# Patient Record
Sex: Female | Born: 1950 | Race: White | Hispanic: No | Smoking: Current every day smoker
Health system: Southern US, Community
[De-identification: ages and names within clinical notes are randomized; demographics above are authoritative.]

## PROBLEM LIST (undated history)

## (undated) DIAGNOSIS — G43909 Migraine, unspecified, not intractable, without status migrainosus: Secondary | ICD-10-CM

## (undated) DIAGNOSIS — J449 Chronic obstructive pulmonary disease, unspecified: Secondary | ICD-10-CM

## (undated) DIAGNOSIS — J45909 Unspecified asthma, uncomplicated: Secondary | ICD-10-CM

## (undated) DIAGNOSIS — I10 Essential (primary) hypertension: Secondary | ICD-10-CM

## (undated) DIAGNOSIS — I83893 Varicose veins of bilateral lower extremities with other complications: Secondary | ICD-10-CM

## (undated) HISTORY — PX: TUBAL LIGATION: SHX77

## (undated) HISTORY — PX: ANKLE SURGERY: SHX546

## (undated) HISTORY — PX: CHOLECYSTECTOMY: SHX55

## (undated) HISTORY — DX: Varicose veins of bilateral lower extremities with other complications: I83.893

## (undated) HISTORY — PX: OTHER SURGICAL HISTORY: SHX169

---

## 1999-09-16 ENCOUNTER — Emergency Department (HOSPITAL_COMMUNITY): Admission: EM | Admit: 1999-09-16 | Discharge: 1999-09-16 | Payer: Self-pay | Admitting: Emergency Medicine

## 2003-01-26 DIAGNOSIS — K219 Gastro-esophageal reflux disease without esophagitis: Secondary | ICD-10-CM | POA: Insufficient documentation

## 2004-10-17 ENCOUNTER — Emergency Department: Payer: Self-pay | Admitting: Unknown Physician Specialty

## 2005-05-13 ENCOUNTER — Emergency Department: Payer: Self-pay | Admitting: Emergency Medicine

## 2005-11-08 ENCOUNTER — Other Ambulatory Visit: Payer: Self-pay

## 2005-11-08 ENCOUNTER — Inpatient Hospital Stay: Payer: Self-pay | Admitting: Unknown Physician Specialty

## 2006-05-12 ENCOUNTER — Other Ambulatory Visit: Payer: Self-pay

## 2006-05-12 ENCOUNTER — Emergency Department: Payer: Self-pay | Admitting: Emergency Medicine

## 2006-10-01 ENCOUNTER — Emergency Department: Payer: Self-pay | Admitting: Internal Medicine

## 2007-11-03 ENCOUNTER — Emergency Department: Payer: Self-pay | Admitting: Emergency Medicine

## 2008-05-24 ENCOUNTER — Emergency Department: Payer: Self-pay | Admitting: Emergency Medicine

## 2010-09-14 ENCOUNTER — Emergency Department: Payer: Self-pay | Admitting: Emergency Medicine

## 2010-10-08 ENCOUNTER — Emergency Department: Payer: Self-pay | Admitting: Emergency Medicine

## 2010-11-25 ENCOUNTER — Emergency Department: Payer: Self-pay | Admitting: Emergency Medicine

## 2011-12-07 ENCOUNTER — Emergency Department: Payer: Self-pay | Admitting: Internal Medicine

## 2011-12-07 LAB — CBC
HCT: 43.8 % (ref 35.0–47.0)
HGB: 15.1 g/dL (ref 12.0–16.0)
MCHC: 34.4 g/dL (ref 32.0–36.0)
RBC: 5.04 10*6/uL (ref 3.80–5.20)
RDW: 12.5 % (ref 11.5–14.5)

## 2011-12-07 LAB — COMPREHENSIVE METABOLIC PANEL
Albumin: 4 g/dL (ref 3.4–5.0)
Anion Gap: 10 (ref 7–16)
Calcium, Total: 8.8 mg/dL (ref 8.5–10.1)
Chloride: 108 mmol/L — ABNORMAL HIGH (ref 98–107)
Co2: 25 mmol/L (ref 21–32)
EGFR (Non-African Amer.): >60
Potassium: 3.6 mmol/L (ref 3.5–5.1)
Sodium: 143 mmol/L (ref 136–145)

## 2011-12-07 LAB — TROPONIN I: Troponin-I: <0.02 ng/mL

## 2011-12-17 DIAGNOSIS — J45909 Unspecified asthma, uncomplicated: Secondary | ICD-10-CM | POA: Insufficient documentation

## 2012-03-05 ENCOUNTER — Emergency Department: Payer: Self-pay | Admitting: Emergency Medicine

## 2012-03-07 ENCOUNTER — Emergency Department: Payer: Self-pay | Admitting: Emergency Medicine

## 2012-03-30 DIAGNOSIS — J309 Allergic rhinitis, unspecified: Secondary | ICD-10-CM | POA: Insufficient documentation

## 2012-06-03 ENCOUNTER — Emergency Department: Payer: Self-pay | Admitting: Emergency Medicine

## 2012-10-01 ENCOUNTER — Emergency Department: Payer: Self-pay | Admitting: Emergency Medicine

## 2012-10-01 LAB — COMPREHENSIVE METABOLIC PANEL
Albumin: 4 g/dL (ref 3.4–5.0)
Alkaline Phosphatase: 82 U/L (ref 50–136)
Anion Gap: 9 (ref 7–16)
Bilirubin,Total: 0.8 mg/dL (ref 0.2–1.0)
Calcium, Total: 8.6 mg/dL (ref 8.5–10.1)
Chloride: 105 mmol/L (ref 98–107)
Co2: 23 mmol/L (ref 21–32)
Creatinine: 0.64 mg/dL (ref 0.60–1.30)
EGFR (African American): >60
Glucose: 94 mg/dL (ref 65–99)
Osmolality: 272 (ref 275–301)
SGOT(AST): 25 U/L (ref 15–37)
SGPT (ALT): 29 U/L (ref 12–78)
Sodium: 137 mmol/L (ref 136–145)
Total Protein: 7.7 g/dL (ref 6.4–8.2)

## 2012-10-01 LAB — CBC
HGB: 14.9 g/dL (ref 12.0–16.0)
MCH: 29.5 pg (ref 26.0–34.0)
MCHC: 34.3 g/dL (ref 32.0–36.0)
MCV: 86 fL (ref 80–100)
WBC: 4.9 10*3/uL (ref 3.6–11.0)

## 2012-10-01 LAB — CK TOTAL AND CKMB (NOT AT ARMC): CK-MB: <0.5 ng/mL — ABNORMAL LOW (ref 0.5–3.6)

## 2012-10-01 LAB — PRO B NATRIURETIC PEPTIDE: B-Type Natriuretic Peptide: 25 pg/mL (ref 0–125)

## 2012-10-18 ENCOUNTER — Emergency Department: Payer: Self-pay | Admitting: Emergency Medicine

## 2012-10-28 ENCOUNTER — Emergency Department: Payer: Self-pay | Admitting: Emergency Medicine

## 2012-12-28 DIAGNOSIS — F418 Other specified anxiety disorders: Secondary | ICD-10-CM | POA: Insufficient documentation

## 2012-12-28 DIAGNOSIS — L309 Dermatitis, unspecified: Secondary | ICD-10-CM | POA: Insufficient documentation

## 2013-05-31 ENCOUNTER — Emergency Department: Payer: Self-pay | Admitting: Emergency Medicine

## 2013-11-01 ENCOUNTER — Emergency Department: Payer: Self-pay | Admitting: Emergency Medicine

## 2013-11-01 LAB — COMPREHENSIVE METABOLIC PANEL
AST: 21 U/L (ref 15–37)
Albumin: 3.4 g/dL (ref 3.4–5.0)
Alkaline Phosphatase: 73 U/L
Anion Gap: 6 — ABNORMAL LOW (ref 7–16)
BILIRUBIN TOTAL: 0.7 mg/dL (ref 0.2–1.0)
BUN: 10 mg/dL (ref 7–18)
CREATININE: 0.73 mg/dL (ref 0.60–1.30)
Calcium, Total: 8 mg/dL — ABNORMAL LOW (ref 8.5–10.1)
Chloride: 111 mmol/L — ABNORMAL HIGH (ref 98–107)
Co2: 25 mmol/L (ref 21–32)
EGFR (Non-African Amer.): >60
GLUCOSE: 92 mg/dL (ref 65–99)
Osmolality: 282 (ref 275–301)
Potassium: 3.4 mmol/L — ABNORMAL LOW (ref 3.5–5.1)
SGPT (ALT): 24 U/L (ref 12–78)
Sodium: 142 mmol/L (ref 136–145)
Total Protein: 6.8 g/dL (ref 6.4–8.2)

## 2013-11-01 LAB — URINALYSIS, COMPLETE
Bilirubin,UR: NEGATIVE
GLUCOSE, UR: NEGATIVE mg/dL (ref 0–75)
KETONE: NEGATIVE
Leukocyte Esterase: NEGATIVE
Nitrite: NEGATIVE
Ph: 5 (ref 4.5–8.0)
Protein: NEGATIVE
SPECIFIC GRAVITY: 1.006 (ref 1.003–1.030)
Squamous Epithelial: 5

## 2013-11-01 LAB — CBC
HCT: 45 % (ref 35.0–47.0)
HGB: 15.5 g/dL (ref 12.0–16.0)
MCH: 29.8 pg (ref 26.0–34.0)
MCHC: 34.5 g/dL (ref 32.0–36.0)
MCV: 86 fL (ref 80–100)
Platelet: 187 10*3/uL (ref 150–440)
RBC: 5.22 10*6/uL — AB (ref 3.80–5.20)
RDW: 12.7 % (ref 11.5–14.5)
WBC: 6 10*3/uL (ref 3.6–11.0)

## 2014-01-22 ENCOUNTER — Emergency Department: Payer: Self-pay | Admitting: Emergency Medicine

## 2014-01-22 LAB — COMPREHENSIVE METABOLIC PANEL
ALBUMIN: 3.7 g/dL (ref 3.4–5.0)
ALK PHOS: 76 U/L
ALT: 20 U/L (ref 12–78)
Anion Gap: 9 (ref 7–16)
BILIRUBIN TOTAL: 0.6 mg/dL (ref 0.2–1.0)
BUN: 14 mg/dL (ref 7–18)
CALCIUM: 8.4 mg/dL — AB (ref 8.5–10.1)
Chloride: 107 mmol/L (ref 98–107)
Co2: 26 mmol/L (ref 21–32)
Creatinine: 0.92 mg/dL (ref 0.60–1.30)
EGFR (Non-African Amer.): >60
GLUCOSE: 120 mg/dL — AB (ref 65–99)
OSMOLALITY: 285 (ref 275–301)
POTASSIUM: 3.4 mmol/L — AB (ref 3.5–5.1)
SGOT(AST): 25 U/L (ref 15–37)
SODIUM: 142 mmol/L (ref 136–145)
TOTAL PROTEIN: 7.1 g/dL (ref 6.4–8.2)

## 2014-01-22 LAB — URINALYSIS, COMPLETE
BACTERIA: NONE SEEN
Bilirubin,UR: NEGATIVE
Glucose,UR: NEGATIVE mg/dL (ref 0–75)
Ketone: NEGATIVE
Nitrite: NEGATIVE
PH: 5 (ref 4.5–8.0)
Protein: NEGATIVE
RBC,UR: 3 /HPF (ref 0–5)
Specific Gravity: 1.024 (ref 1.003–1.030)
Squamous Epithelial: 3
WBC UR: 4 /HPF (ref 0–5)

## 2014-01-22 LAB — CBC WITH DIFFERENTIAL/PLATELET
Basophil #: 0 10*3/uL (ref 0.0–0.1)
Basophil %: 0.8 %
EOS ABS: 0.2 10*3/uL (ref 0.0–0.7)
Eosinophil %: 2.6 %
HCT: 42.6 % (ref 35.0–47.0)
HGB: 14.2 g/dL (ref 12.0–16.0)
Lymphocyte #: 1.7 10*3/uL (ref 1.0–3.6)
Lymphocyte %: 29.7 %
MCH: 28.5 pg (ref 26.0–34.0)
MCHC: 33.4 g/dL (ref 32.0–36.0)
MCV: 85 fL (ref 80–100)
MONO ABS: 0.4 x10 3/mm (ref 0.2–0.9)
Monocyte %: 7.5 %
Neutrophil #: 3.4 10*3/uL (ref 1.4–6.5)
Neutrophil %: 59.4 %
Platelet: 229 10*3/uL (ref 150–440)
RBC: 4.99 10*6/uL (ref 3.80–5.20)
RDW: 12.6 % (ref 11.5–14.5)
WBC: 5.8 10*3/uL (ref 3.6–11.0)

## 2014-01-22 LAB — LIPASE, BLOOD: LIPASE: 216 U/L (ref 73–393)

## 2014-01-22 LAB — TROPONIN I

## 2014-02-03 ENCOUNTER — Emergency Department: Payer: Self-pay | Admitting: Internal Medicine

## 2014-02-03 LAB — URINALYSIS, COMPLETE
BILIRUBIN, UR: NEGATIVE
GLUCOSE, UR: NEGATIVE mg/dL (ref 0–75)
LEUKOCYTE ESTERASE: NEGATIVE
NITRITE: NEGATIVE
PROTEIN: NEGATIVE
Ph: 5 (ref 4.5–8.0)
Specific Gravity: 1.023 (ref 1.003–1.030)
Squamous Epithelial: 4
WBC UR: 2 /HPF (ref 0–5)

## 2014-02-03 LAB — COMPREHENSIVE METABOLIC PANEL
AST: 28 U/L (ref 15–37)
Albumin: 3.8 g/dL (ref 3.4–5.0)
Alkaline Phosphatase: 76 U/L
Anion Gap: 5 — ABNORMAL LOW (ref 7–16)
BUN: 12 mg/dL (ref 7–18)
Bilirubin,Total: 0.7 mg/dL (ref 0.2–1.0)
CALCIUM: 8.9 mg/dL (ref 8.5–10.1)
Chloride: 106 mmol/L (ref 98–107)
Co2: 29 mmol/L (ref 21–32)
Creatinine: 0.8 mg/dL (ref 0.60–1.30)
EGFR (Non-African Amer.): >60
Glucose: 109 mg/dL — ABNORMAL HIGH (ref 65–99)
OSMOLALITY: 280 (ref 275–301)
Potassium: 3.5 mmol/L (ref 3.5–5.1)
SGPT (ALT): 26 U/L (ref 12–78)
SODIUM: 140 mmol/L (ref 136–145)
Total Protein: 7.4 g/dL (ref 6.4–8.2)

## 2014-02-03 LAB — CBC
HCT: 41.9 % (ref 35.0–47.0)
HGB: 14.4 g/dL (ref 12.0–16.0)
MCH: 29.1 pg (ref 26.0–34.0)
MCHC: 34.4 g/dL (ref 32.0–36.0)
MCV: 85 fL (ref 80–100)
Platelet: 219 10*3/uL (ref 150–440)
RBC: 4.96 10*6/uL (ref 3.80–5.20)
RDW: 12.7 % (ref 11.5–14.5)
WBC: 4.8 10*3/uL (ref 3.6–11.0)

## 2014-02-03 LAB — TROPONIN I: Troponin-I: <0.02 ng/mL

## 2014-04-13 ENCOUNTER — Emergency Department: Payer: Self-pay | Admitting: Student

## 2014-04-22 ENCOUNTER — Emergency Department: Payer: Self-pay | Admitting: Internal Medicine

## 2014-05-01 ENCOUNTER — Emergency Department: Payer: Self-pay | Admitting: Emergency Medicine

## 2014-06-01 ENCOUNTER — Emergency Department: Payer: Self-pay | Admitting: Emergency Medicine

## 2014-06-13 ENCOUNTER — Emergency Department: Payer: Self-pay | Admitting: Emergency Medicine

## 2014-06-13 LAB — URINALYSIS, COMPLETE
Bilirubin,UR: NEGATIVE
Glucose,UR: NEGATIVE mg/dL (ref 0–75)
KETONE: NEGATIVE
Nitrite: NEGATIVE
PH: 5 (ref 4.5–8.0)
PROTEIN: NEGATIVE
Specific Gravity: 1.014 (ref 1.003–1.030)
Squamous Epithelial: 7
WBC UR: 5 /HPF (ref 0–5)

## 2014-06-13 LAB — COMPREHENSIVE METABOLIC PANEL WITH GFR
Albumin: 3.8 g/dL
Alkaline Phosphatase: 83 U/L
Anion Gap: 7
BUN: 17 mg/dL
Bilirubin,Total: 0.7 mg/dL
Calcium, Total: 8.9 mg/dL
Chloride: 102 mmol/L
Co2: 27 mmol/L
Creatinine: 0.9 mg/dL
EGFR (African American): >60
EGFR (Non-African Amer.): >60
Glucose: 115 mg/dL — ABNORMAL HIGH
Osmolality: 274
Potassium: 3.8 mmol/L
SGOT(AST): 22 U/L
SGPT (ALT): 30 U/L
Sodium: 136 mmol/L
Total Protein: 7.7 g/dL

## 2014-06-13 LAB — CBC WITH DIFFERENTIAL/PLATELET
BASOS ABS: 0 10*3/uL (ref 0.0–0.1)
Basophil %: 0.9 %
Eosinophil #: 0 10*3/uL (ref 0.0–0.7)
Eosinophil %: 0.2 %
HCT: 42.1 % (ref 35.0–47.0)
HGB: 14.1 g/dL (ref 12.0–16.0)
LYMPHS PCT: 28.1 %
Lymphocyte #: 1.6 10*3/uL (ref 1.0–3.6)
MCH: 29.1 pg (ref 26.0–34.0)
MCHC: 33.4 g/dL (ref 32.0–36.0)
MCV: 87 fL (ref 80–100)
Monocyte #: 0.7 x10 3/mm (ref 0.2–0.9)
Monocyte %: 12.6 %
Neutrophil #: 3.2 10*3/uL (ref 1.4–6.5)
Neutrophil %: 58.2 %
Platelet: 235 10*3/uL (ref 150–440)
RBC: 4.83 10*6/uL (ref 3.80–5.20)
RDW: 12.3 % (ref 11.5–14.5)
WBC: 5.5 10*3/uL (ref 3.6–11.0)

## 2014-06-15 LAB — BETA STREP CULTURE(ARMC)

## 2014-07-24 ENCOUNTER — Emergency Department: Payer: Self-pay | Admitting: Emergency Medicine

## 2014-07-24 LAB — LIPASE, BLOOD: Lipase: 120 U/L (ref 73–393)

## 2014-07-24 LAB — CBC WITH DIFFERENTIAL/PLATELET
BASOS ABS: 0 10*3/uL (ref 0.0–0.1)
Basophil %: 0.3 %
Eosinophil #: 0.1 10*3/uL (ref 0.0–0.7)
Eosinophil %: 1.5 %
HCT: 44.2 % (ref 35.0–47.0)
HGB: 14.7 g/dL (ref 12.0–16.0)
Lymphocyte #: 0.7 10*3/uL — ABNORMAL LOW (ref 1.0–3.6)
Lymphocyte %: 9.3 %
MCH: 28.7 pg (ref 26.0–34.0)
MCHC: 33.1 g/dL (ref 32.0–36.0)
MCV: 87 fL (ref 80–100)
Monocyte #: 0.3 x10 3/mm (ref 0.2–0.9)
Monocyte %: 3.4 %
Neutrophil #: 6.5 10*3/uL (ref 1.4–6.5)
Neutrophil %: 85.5 %
PLATELETS: 240 10*3/uL (ref 150–440)
RBC: 5.1 10*6/uL (ref 3.80–5.20)
RDW: 12.6 % (ref 11.5–14.5)
WBC: 7.6 10*3/uL (ref 3.6–11.0)

## 2014-07-24 LAB — COMPREHENSIVE METABOLIC PANEL
ALT: 27 U/L
AST: 25 U/L (ref 15–37)
Albumin: 3.7 g/dL (ref 3.4–5.0)
Alkaline Phosphatase: 83 U/L
Anion Gap: 6 — ABNORMAL LOW (ref 7–16)
BUN: 12 mg/dL (ref 7–18)
Bilirubin,Total: 0.9 mg/dL (ref 0.2–1.0)
CO2: 23 mmol/L (ref 21–32)
Calcium, Total: 8.5 mg/dL (ref 8.5–10.1)
Chloride: 108 mmol/L — ABNORMAL HIGH (ref 98–107)
Creatinine: 0.59 mg/dL — ABNORMAL LOW (ref 0.60–1.30)
EGFR (Non-African Amer.): >60
GLUCOSE: 100 mg/dL — AB (ref 65–99)
OSMOLALITY: 274 (ref 275–301)
Potassium: 4.1 mmol/L (ref 3.5–5.1)
Sodium: 137 mmol/L (ref 136–145)
Total Protein: 7.3 g/dL (ref 6.4–8.2)

## 2014-07-24 LAB — URINALYSIS, COMPLETE
Bacteria: NONE SEEN
Bilirubin,UR: NEGATIVE
Glucose,UR: NEGATIVE mg/dL (ref 0–75)
Ketone: NEGATIVE
Leukocyte Esterase: NEGATIVE
Nitrite: NEGATIVE
PH: 5 (ref 4.5–8.0)
PROTEIN: NEGATIVE
SPECIFIC GRAVITY: 1.015 (ref 1.003–1.030)
Squamous Epithelial: 1

## 2014-09-14 ENCOUNTER — Emergency Department: Payer: Self-pay | Admitting: Emergency Medicine

## 2014-09-14 LAB — URINALYSIS, COMPLETE
Bilirubin,UR: NEGATIVE
GLUCOSE, UR: NEGATIVE mg/dL (ref 0–75)
Ketone: NEGATIVE
NITRITE: NEGATIVE
PROTEIN: NEGATIVE
Ph: 5 (ref 4.5–8.0)
RBC,UR: 1 /HPF (ref 0–5)
SPECIFIC GRAVITY: 1.004 (ref 1.003–1.030)
Squamous Epithelial: 2
WBC UR: 1 /HPF (ref 0–5)

## 2014-09-14 LAB — COMPREHENSIVE METABOLIC PANEL
ALK PHOS: 72 U/L (ref 46–116)
ALT: 32 U/L (ref 14–63)
Albumin: 3.8 g/dL (ref 3.4–5.0)
Anion Gap: 6 — ABNORMAL LOW (ref 7–16)
BUN: 13 mg/dL (ref 7–18)
Bilirubin,Total: 0.5 mg/dL (ref 0.2–1.0)
CALCIUM: 9.1 mg/dL (ref 8.5–10.1)
CO2: 29 mmol/L (ref 21–32)
Chloride: 105 mmol/L (ref 98–107)
Creatinine: 0.71 mg/dL (ref 0.60–1.30)
Glucose: 112 mg/dL — ABNORMAL HIGH (ref 65–99)
Osmolality: 280 (ref 275–301)
POTASSIUM: 3.7 mmol/L (ref 3.5–5.1)
SGOT(AST): 28 U/L (ref 15–37)
SODIUM: 140 mmol/L (ref 136–145)
Total Protein: 7.7 g/dL (ref 6.4–8.2)

## 2014-09-14 LAB — CBC
HCT: 42.3 % (ref 35.0–47.0)
HGB: 14.5 g/dL (ref 12.0–16.0)
MCH: 29 pg (ref 26.0–34.0)
MCHC: 34.3 g/dL (ref 32.0–36.0)
MCV: 85 fL (ref 80–100)
PLATELETS: 245 10*3/uL (ref 150–440)
RBC: 4.99 10*6/uL (ref 3.80–5.20)
RDW: 12.7 % (ref 11.5–14.5)
WBC: 6.8 10*3/uL (ref 3.6–11.0)

## 2014-09-14 LAB — TROPONIN I: Troponin-I: <0.02 ng/mL

## 2014-11-02 ENCOUNTER — Emergency Department: Payer: Self-pay | Admitting: Emergency Medicine

## 2014-12-22 ENCOUNTER — Emergency Department
Admission: EM | Admit: 2014-12-22 | Discharge: 2014-12-22 | Disposition: A | Discharge status: Home / Self Care | Payer: BLUE CROSS/BLUE SHIELD | Attending: Emergency Medicine | Admitting: Emergency Medicine

## 2014-12-22 ENCOUNTER — Encounter: Payer: Self-pay | Admitting: *Deleted

## 2014-12-22 DIAGNOSIS — N390 Urinary tract infection, site not specified: Secondary | ICD-10-CM

## 2014-12-22 DIAGNOSIS — Z87891 Personal history of nicotine dependence: Secondary | ICD-10-CM | POA: Diagnosis not present

## 2014-12-22 DIAGNOSIS — M545 Low back pain: Secondary | ICD-10-CM | POA: Diagnosis not present

## 2014-12-22 DIAGNOSIS — R6883 Chills (without fever): Secondary | ICD-10-CM | POA: Diagnosis present

## 2014-12-22 DIAGNOSIS — I10 Essential (primary) hypertension: Secondary | ICD-10-CM | POA: Insufficient documentation

## 2014-12-22 DIAGNOSIS — Z7982 Long term (current) use of aspirin: Secondary | ICD-10-CM | POA: Insufficient documentation

## 2014-12-22 HISTORY — DX: Chronic obstructive pulmonary disease, unspecified: J44.9

## 2014-12-22 HISTORY — DX: Unspecified asthma, uncomplicated: J45.909

## 2014-12-22 HISTORY — DX: Essential (primary) hypertension: I10

## 2014-12-22 LAB — URINALYSIS COMPLETE WITH MICROSCOPIC (ARMC ONLY)
Bilirubin Urine: NEGATIVE
GLUCOSE, UA: NEGATIVE mg/dL
Ketones, ur: NEGATIVE mg/dL
NITRITE: NEGATIVE
Protein, ur: NEGATIVE mg/dL
Specific Gravity, Urine: 1.005 (ref 1.005–1.030)
pH: 6 (ref 5.0–8.0)

## 2014-12-22 MED ORDER — CIPROFLOXACIN HCL 500 MG PO TABS: 500.0000 mg | ORAL_TABLET | Freq: Two times a day (BID) | ORAL | Status: DC

## 2014-12-22 NOTE — ED Notes (Signed)
Pt complains of runny nose, chills and diarrhea starting yesterday

## 2014-12-22 NOTE — ED Provider Notes (Signed)
[facility] Emergency Department Provider Note  ____________________________________________  Time seen: 1602  I have reviewed the triage vital signs and the nursing notes.   HISTORY  Chief Complaint Sore Throat   HPI Andrea Ford is a 64 y.o. female comes today complaining of chills, productive cough, diarrhea 5 times in last 24 hours. Symptoms started yesterday. She's also had a runny nose which occasionally is green. She has been taking over-the-counter Claritin without any relief. She also gives a history of having a urinary tract infection 2 weeks ago at which time she was treated with a sulfa drug.Patient describes aching in her lower back and upper shoulders. She denies any nausea or vomiting. She has not taken any Tylenol or Motrin for her fever or aches. Her primary care doctor is in [place] and she also sees a doctor at a walk-in clinic in [place]. Past Medical History  Diagnosis Date  . Hypertension   . COPD (chronic obstructive pulmonary disease)   . Asthma     There are no active problems to display for this patient.   Past Surgical History  Procedure Laterality Date  . Cholecystectomy    . Ankle surgery Right     Current Outpatient Rx  Name  Route  Sig  Dispense  Refill  . aspirin 81 MG chewable tablet   Oral   Chew 81 mg by mouth daily.         . enalapril (VASOTEC) 10 MG tablet   Oral   Take 10 mg by mouth 2 (two) times daily.         . ciprofloxacin (CIPRO) 500 MG tablet   Oral   Take 1 tablet (500 mg total) by mouth 2 (two) times daily.   14 tablet   0     Allergies Codeine  No family history on file.  Social History History  Substance Use Topics  . Smoking status: Former Research scientist (life sciences)  . Smokeless tobacco: Not on file  . Alcohol Use: No    Review of Systems Constitutional: Temp not taken. Positive for chills.  Eyes: No visual changes. ENT: Positive sore throat. Cardiovascular: Denies chest  pain. Respiratory: Denies shortness of breath. Gastrointestinal: No abdominal pain.  No nausea, no vomiting.  Positive diarrhea.  No constipation. Genitourinary: Negative for dysuria. Musculoskeletal: Positive for low back pain  Skin: Negative for rash. Neurological: Negative for headaches 10-point ROS otherwise negative.  ____________________________________________   PHYSICAL EXAM:  VITAL SIGNS: ED Triage Vitals  Enc Vitals Group     BP 12/22/14 1518 151/90 mmHg     Pulse Rate 12/22/14 1518 102     Resp --      Temp 12/22/14 1518 98.9 F (37.2 C)     Temp Source 12/22/14 1518 Oral     SpO2 12/22/14 1518 98 %     Weight 12/22/14 1518 166 lb (75.297 kg)     Height 12/22/14 1518 5\' 3"  (1.6 m)     Head Cir --      Peak Flow --      Pain Score --      Pain Loc --      Pain Edu? --      Excl. in [place]? --     Constitutional: Alert and oriented. Well appearing and in no acute distress. Eyes: Conjunctivae are normal. PERRL. EOMI. Head: Atraumatic. Nose: No congestion/rhinnorhea. Mouth/Throat: Mucous membranes are moist.  Oropharynx non-erythematous. Neck: No stridor.   Hematological/Lymphatic/Immunilogical: No cervical lymphadenopathy. Cardiovascular:  Normal rate, regular rhythm. Grossly normal heart sounds.  Good peripheral circulation. Respiratory: Normal respiratory effort.  No retractions. Lungs CTAB. Gastrointestinal: Soft and nontender. No distention. No abdominal bruits. No CVA tenderness.  Bowel sounds normoactive 4 quadrants Genitourinary: Denies any symptoms. Musculoskeletal: No lower extremity tenderness nor edema.  No joint effusions. Nontender palpation of lower back. No CVA tenderness. Neurologic:  Normal speech and language. No gross focal neurologic deficits are appreciated. Speech is normal. No gait instability. Skin:  Skin is warm, dry and intact. No rash noted. Psychiatric: Mood and affect are normal. Speech and behavior are  normal.  ____________________________________________   LABS (all labs ordered are listed, but only abnormal results are displayed)  Labs Reviewed  URINALYSIS COMPLETEWITH MICROSCOPIC ([place])  - Abnormal; Notable for the following:    Color, Urine STRAW (*)    APPearance CLEAR (*)    Hgb urine dipstick 1+ (*)    Leukocytes, UA 2+ (*)    Bacteria, UA RARE (*)    Squamous Epithelial / LPF 0-5 (*)    All other components within normal limits   ____________________________________________  EKG  Not done ____________________________________________  RADIOLOGY  Not done ____________________________________________   PROCEDURES  Procedure(s) performed: None  Critical Care performed: No  ____________________________________________   INITIAL IMPRESSION / ASSESSMENT AND PLAN / ED COURSE  Pertinent labs & imaging results that were available during my care of the patient were reviewed by me and considered in my medical decision making (see chart for details).  Patient is to follow-up with her doctor in [place] or the walk-in clinic she is familiar with in [place]. Today her urinalysis shows 2+ leukocytes and 6-30 WBCs. She has been placed on Cipro for 7 days along with Zofran if needed for nausea. She is to stay on a clear liquid diet due to diarrhea. And was given a note for work. ____________________________________________   FINAL CLINICAL IMPRESSION(S) / ED DIAGNOSES  Final diagnoses:  Acute urinary tract infection      Johnn Hai, PA-C 12/22/14 1755

## 2014-12-22 NOTE — ED Notes (Signed)
Lower back pain, nausea, sore throat , " I hurt all over " , no injury

## 2014-12-25 ENCOUNTER — Emergency Department
Admission: EM | Admit: 2014-12-25 | Discharge: 2014-12-25 | Disposition: A | Discharge status: Home / Self Care | Payer: BLUE CROSS/BLUE SHIELD | Attending: Emergency Medicine | Admitting: Emergency Medicine

## 2014-12-25 ENCOUNTER — Emergency Department: Payer: BLUE CROSS/BLUE SHIELD

## 2014-12-25 DIAGNOSIS — R0981 Nasal congestion: Secondary | ICD-10-CM | POA: Diagnosis present

## 2014-12-25 DIAGNOSIS — Z792 Long term (current) use of antibiotics: Secondary | ICD-10-CM | POA: Diagnosis not present

## 2014-12-25 DIAGNOSIS — N72 Inflammatory disease of cervix uteri: Secondary | ICD-10-CM

## 2014-12-25 DIAGNOSIS — J069 Acute upper respiratory infection, unspecified: Secondary | ICD-10-CM | POA: Diagnosis not present

## 2014-12-25 DIAGNOSIS — Z87891 Personal history of nicotine dependence: Secondary | ICD-10-CM | POA: Diagnosis not present

## 2014-12-25 DIAGNOSIS — I10 Essential (primary) hypertension: Secondary | ICD-10-CM | POA: Diagnosis not present

## 2014-12-25 DIAGNOSIS — Z7982 Long term (current) use of aspirin: Secondary | ICD-10-CM | POA: Insufficient documentation

## 2014-12-25 DIAGNOSIS — R300 Dysuria: Secondary | ICD-10-CM | POA: Insufficient documentation

## 2014-12-25 DIAGNOSIS — Z79899 Other long term (current) drug therapy: Secondary | ICD-10-CM | POA: Diagnosis not present

## 2014-12-25 DIAGNOSIS — M545 Low back pain: Secondary | ICD-10-CM | POA: Insufficient documentation

## 2014-12-25 LAB — CBC
HCT: 40.6 % (ref 35.0–47.0)
Hemoglobin: 14.1 g/dL (ref 12.0–16.0)
MCH: 29.2 pg (ref 26.0–34.0)
MCHC: 34.6 g/dL (ref 32.0–36.0)
MCV: 84.6 fL (ref 80.0–100.0)
Platelets: 231 10*3/uL (ref 150–440)
RBC: 4.8 MIL/uL (ref 3.80–5.20)
RDW: 12.5 % (ref 11.5–14.5)
WBC: 5.6 10*3/uL (ref 3.6–11.0)

## 2014-12-25 LAB — URINALYSIS COMPLETE WITH MICROSCOPIC (ARMC ONLY)
BACTERIA UA: NONE SEEN
BILIRUBIN URINE: NEGATIVE
Glucose, UA: NEGATIVE mg/dL
Ketones, ur: NEGATIVE mg/dL
Nitrite: NEGATIVE
PH: 5 (ref 5.0–8.0)
PROTEIN: NEGATIVE mg/dL
SPECIFIC GRAVITY, URINE: 1.021 (ref 1.005–1.030)

## 2014-12-25 LAB — COMPREHENSIVE METABOLIC PANEL
ALT: 24 U/L (ref 14–54)
ANION GAP: 8 (ref 5–15)
AST: 28 U/L (ref 15–41)
Albumin: 4.1 g/dL (ref 3.5–5.0)
Alkaline Phosphatase: 66 U/L (ref 38–126)
BILIRUBIN TOTAL: 0.6 mg/dL (ref 0.3–1.2)
BUN: 10 mg/dL (ref 6–20)
CO2: 27 mmol/L (ref 22–32)
Calcium: 8.9 mg/dL (ref 8.9–10.3)
Chloride: 104 mmol/L (ref 101–111)
Creatinine, Ser: 0.68 mg/dL (ref 0.44–1.00)
GFR calc Af Amer: >60 mL/min (ref >60)
GFR calc non Af Amer: >60 mL/min (ref >60)
Glucose, Bld: 107 mg/dL — ABNORMAL HIGH (ref 65–99)
Potassium: 3.9 mmol/L (ref 3.5–5.1)
SODIUM: 139 mmol/L (ref 135–145)
Total Protein: 7.7 g/dL (ref 6.5–8.1)

## 2014-12-25 LAB — WET PREP, GENITAL
Clue Cells Wet Prep HPF POC: NONE SEEN
Trich, Wet Prep: NONE SEEN
YEAST WET PREP: NONE SEEN

## 2014-12-25 MED ORDER — BENZONATATE 100 MG PO CAPS: 200.0000 mg | ORAL_CAPSULE | Freq: Three times a day (TID) | ORAL | Status: AC | PRN

## 2014-12-25 MED ORDER — AZITHROMYCIN 250 MG PO TABS
1000.0000 mg | ORAL_TABLET | Freq: Once | ORAL | Status: AC
  Administered 2014-12-25: 1000 mg via ORAL

## 2014-12-25 MED ORDER — CEFTRIAXONE SODIUM 250 MG IJ SOLR
250.0000 mg | Freq: Once | INTRAMUSCULAR | Status: AC
  Administered 2014-12-25: 250 mg via INTRAMUSCULAR

## 2014-12-25 MED ORDER — AZITHROMYCIN 250 MG PO TABS
ORAL_TABLET | ORAL | Status: AC
  Administered 2014-12-25: 1000 mg via ORAL
  Filled 2014-12-25: qty 4

## 2014-12-25 MED ORDER — LIDOCAINE HCL (PF) 1 % IJ SOLN
0.9000 mL | Freq: Once | INTRAMUSCULAR | Status: AC
  Administered 2014-12-25: 0.9 mL

## 2014-12-25 MED ORDER — LIDOCAINE HCL (PF) 1 % IJ SOLN
INTRAMUSCULAR | Status: AC
  Administered 2014-12-25: 0.9 mL
  Filled 2014-12-25: qty 5

## 2014-12-25 MED ORDER — CEFTRIAXONE SODIUM 250 MG IJ SOLR
INTRAMUSCULAR | Status: AC
  Administered 2014-12-25: 250 mg via INTRAMUSCULAR
  Filled 2014-12-25: qty 250

## 2014-12-25 NOTE — ED Provider Notes (Signed)
[facility] Emergency Department Provider Note  ____________________________________________  Time seen: 7:28 AM  I have reviewed the triage vital signs and the nursing notes.   HISTORY  Chief Complaint Dysuria and Nasal Congestion    HPI Andrea Ford is a 64 y.o. female was treated for urinary tract infection recently in [place]. She was also seen in the emergency room on 5/12 with continued complaint of dysuria. She returns today with complaint of productive cough, congestion, rhinorrhea. She has continued to take Cipro for her urinary tract infection. She has experienced 5-6 loose stools. She is unaware of any fever nausea or vomiting. She continues with dysuria but denies any vaginal discharge. However she does say that she wears a small mini pad every day for urinary incontinence. She denies being sexually active for the last 5 years. She rates her pain as a 7/10.   Past Medical History  Diagnosis Date  . Hypertension   . COPD (chronic obstructive pulmonary disease)   . Asthma     There are no active problems to display for this patient.   Past Surgical History  Procedure Laterality Date  . Cholecystectomy    . Ankle surgery Right     Current Outpatient Rx  Name  Route  Sig  Dispense  Refill  . aspirin 81 MG chewable tablet   Oral   Chew 81 mg by mouth daily.         . benzonatate (TESSALON PERLES) 100 MG capsule   Oral   Take 2 capsules (200 mg total) by mouth 3 (three) times daily as needed for cough.   40 capsule   0   . ciprofloxacin (CIPRO) 500 MG tablet   Oral   Take 1 tablet (500 mg total) by mouth 2 (two) times daily.   14 tablet   0   . enalapril (VASOTEC) 10 MG tablet   Oral   Take 10 mg by mouth 2 (two) times daily.           Allergies Codeine  No family history on file.  Social History History  Substance Use Topics  . Smoking status: Former Research scientist (life sciences)  . Smokeless tobacco: Not on file  . Alcohol Use: No     Review of Systems Constitutional: No fever/chills Eyes: No visual changes. ENT: Occasional sore throat. Cardiovascular: Denies chest pain. Respiratory: Denies shortness of breath. Occasional cough today nonproductive. Gastrointestinal: No abdominal pain.  No nausea, no vomiting.  No diarrhea.  No constipation. Genitourinary: Negative for dysuria. Musculoskeletal: Positive for low back pain. Skin: Negative for rash. Neurological: Negative for headaches, focal weakness or numbness.  10-point ROS otherwise negative.  ____________________________________________   PHYSICAL EXAM:  VITAL SIGNS: ED Triage Vitals  Enc Vitals Group     BP 12/25/14 0635 142/87 mmHg     Pulse Rate 12/25/14 0635 76     Resp 12/25/14 0635 20     Temp 12/25/14 0635 97.5 F (36.4 C)     Temp Source 12/25/14 0635 Oral     SpO2 12/25/14 0635 98 %     Weight 12/25/14 0635 166 lb (75.297 kg)     Height 12/25/14 0635 5\' 3"  (1.6 m)     Head Cir --      Peak Flow --      Pain Score 12/25/14 0635 7     Pain Loc --      Pain Edu? --      Excl. in [place]? --  Constitutional: Alert and oriented. Well appearing and in no acute distress. Eyes: Conjunctivae are normal. PERRL. EOMI. Head: Atraumatic. Nose: Mild congestionB cases pale Mouth/Throat: Mucous membranes are moist.  Oropharynx non-erythematous. Neck: No stridor.   Hematological/Lymphatic/Immunilogical: No cervical lymphadenopathy. Cardiovascular: Normal rate, regular rhythm. Grossly normal heart sounds.  Good peripheral circulation. Respiratory: Normal respiratory effort.  No retractions. Lungs CTAB. Gastrointestinal: Soft and nontender. No distention. No abdominal bruits. No CVA tenderness. Genitourinary: Pelvic exam was done. On exam there is white exudate in the vaginal vault and around the cervix. Cervix is irritated, slightly red. There is no discharge from the os. Bimanual exam nontender on palpation of the ovaries or cervix. No masses were  noted. Musculoskeletal: No lower extremity tenderness nor edema.  No joint effusions. Neurologic:  Normal speech and language. No gross focal neurologic deficits are appreciated. Speech is normal. No gait instability. Skin:  Skin is warm, dry and intact. No rash noted. Psychiatric: Mood and affect are normal. Speech and behavior are normal.  ____________________________________________   LABS (all labs ordered are listed, but only abnormal results are displayed)  Labs Reviewed  WET PREP, GENITAL - Abnormal; Notable for the following:    WBC, Wet Prep HPF POC MODERATE (*)    All other components within normal limits  URINALYSIS COMPLETEWITH MICROSCOPIC ([facility])  - Abnormal; Notable for the following:    Color, Urine YELLOW (*)    APPearance CLEAR (*)    Hgb urine dipstick 2+ (*)    Leukocytes, UA 1+ (*)    Squamous Epithelial / LPF 0-5 (*)    All other components within normal limits  COMPREHENSIVE METABOLIC PANEL - Abnormal; Notable for the following:    Glucose, Bld 107 (*)    All other components within normal limits  CBC   ____________________________________________ RADIOLOGY  Chest x-ray per radiologist shows no focal infiltrate, pulmonary edema, or pleural effusion ____________________________________________   PROCEDURES  Procedure(s) performed: None  Critical Care performed: No  ____________________________________________   INITIAL IMPRESSION / ASSESSMENT AND PLAN / ED COURSE  Pertinent labs & imaging results that were available during my care of the patient were reviewed by me and considered in my medical decision making (see chart for details).  Lab work was discussed with the patient. She is aware of the findings of her pelvic exam. She was given a injection of Rocephin 250 mg IM and 1 g of Zithromax. She will follow up with her family doctor at [facility]. She was also given a prescription for Tessalon Perles as needed for  cough. ____________________________________________   FINAL CLINICAL IMPRESSION(S) / ED DIAGNOSES  Final diagnoses:  Acute cervicitis  Viral upper respiratory infection      Johnn Hai, PA-C 12/25/14 [place], MD 12/25/14 1539

## 2014-12-25 NOTE — ED Notes (Signed)
Pt presents to ER alert and in NAD. Pt reports she has been treated for UTI recently, but still having dysuria. Pt reports she has to go to work tomorrow and doesn't feel any better.

## 2014-12-25 NOTE — Discharge Instructions (Signed)
Cervicitis Cervicitis is a soreness and swelling (inflammation) of the cervix. Your cervix is located at the bottom of your uterus. It opens up to the vagina. CAUSES   Sexually transmitted infections (STIs).   Allergic reaction.   Medicines or birth control devices that are put in the vagina.   Injury to the cervix.   Bacterial infections.  RISK FACTORS You are at greater risk if you:  Have unprotected sexual intercourse.  Have sexual intercourse with many partners.  Began sexual intercourse at an early age.  Have a history of STIs. SYMPTOMS  There may be no symptoms. If symptoms occur, they may include:   Gray, white, yellow, or bad-smelling vaginal discharge.   Pain or itching of the area outside the vagina.   Painful sexual intercourse.   Lower abdominal or lower back pain, especially during intercourse.   Frequent urination.   Abnormal vaginal bleeding between periods, after sexual intercourse, or after menopause.   Pressure or a heavy feeling in the pelvis.  DIAGNOSIS  Diagnosis is made after a pelvic exam. Other tests may include:   Examination of any discharge under a microscope (wet prep).   A Pap test.  TREATMENT  Treatment will depend on the cause of cervicitis. If it is caused by an STI, both you and your partner will need to be treated. Antibiotic medicines will be given.  HOME CARE INSTRUCTIONS   Do not have sexual intercourse until your health care provider says it is okay.   Do not have sexual intercourse until your partner has been treated, if your cervicitis is caused by an STI.   Take your antibiotics as directed. Finish them even if you start to feel better.  SEEK MEDICAL CARE IF:  Your symptoms come back.   You have a fever.  MAKE SURE YOU:   Understand these instructions.  Will watch your condition.  Will get help right away if you are not doing well or get worse. Document Released: 07/29/2005 Document Revised:  08/03/2013 Document Reviewed: 01/20/2013 [facility] Patient Information 2015 [place], [place]. This information is not intended to replace advice given to you by your health care provider. Make sure you discuss any questions you have with your health care provider.

## 2015-01-02 NOTE — ED Provider Notes (Signed)
I was apparently in the ER during the time the patient was here was available for consult  Nena Polio, MD 01/02/15 [phone]

## 2015-01-05 NOTE — ED Provider Notes (Signed)
Medical screening examination/treatment/procedure(s) were performed by non-physician practitioner and as supervising physician I was immediately available for consultation/collaboration.    Nena Polio, MD 01/05/15 [phone]

## 2015-01-12 ENCOUNTER — Encounter: Payer: Self-pay | Admitting: *Deleted

## 2015-01-12 ENCOUNTER — Emergency Department: Payer: BLUE CROSS/BLUE SHIELD

## 2015-01-12 ENCOUNTER — Emergency Department
Admission: EM | Admit: 2015-01-12 | Discharge: 2015-01-12 | Disposition: A | Discharge status: Home / Self Care | Payer: BLUE CROSS/BLUE SHIELD | Attending: Emergency Medicine | Admitting: Emergency Medicine

## 2015-01-12 DIAGNOSIS — S80861A Insect bite (nonvenomous), right lower leg, initial encounter: Secondary | ICD-10-CM | POA: Insufficient documentation

## 2015-01-12 DIAGNOSIS — I10 Essential (primary) hypertension: Secondary | ICD-10-CM | POA: Insufficient documentation

## 2015-01-12 DIAGNOSIS — S80862A Insect bite (nonvenomous), left lower leg, initial encounter: Secondary | ICD-10-CM | POA: Insufficient documentation

## 2015-01-12 DIAGNOSIS — Z87891 Personal history of nicotine dependence: Secondary | ICD-10-CM | POA: Insufficient documentation

## 2015-01-12 DIAGNOSIS — R05 Cough: Secondary | ICD-10-CM | POA: Diagnosis present

## 2015-01-12 DIAGNOSIS — Z7982 Long term (current) use of aspirin: Secondary | ICD-10-CM | POA: Insufficient documentation

## 2015-01-12 DIAGNOSIS — J40 Bronchitis, not specified as acute or chronic: Secondary | ICD-10-CM

## 2015-01-12 DIAGNOSIS — Y998 Other external cause status: Secondary | ICD-10-CM | POA: Diagnosis not present

## 2015-01-12 DIAGNOSIS — Z79899 Other long term (current) drug therapy: Secondary | ICD-10-CM | POA: Diagnosis not present

## 2015-01-12 DIAGNOSIS — Y9389 Activity, other specified: Secondary | ICD-10-CM | POA: Diagnosis not present

## 2015-01-12 DIAGNOSIS — W57XXXA Bitten or stung by nonvenomous insect and other nonvenomous arthropods, initial encounter: Secondary | ICD-10-CM | POA: Insufficient documentation

## 2015-01-12 DIAGNOSIS — J449 Chronic obstructive pulmonary disease, unspecified: Secondary | ICD-10-CM | POA: Insufficient documentation

## 2015-01-12 DIAGNOSIS — Y9289 Other specified places as the place of occurrence of the external cause: Secondary | ICD-10-CM | POA: Insufficient documentation

## 2015-01-12 MED ORDER — DOXYCYCLINE HYCLATE 100 MG PO CAPS: 100.0000 mg | ORAL_CAPSULE | Freq: Two times a day (BID) | ORAL | Status: AC

## 2015-01-12 MED ORDER — TRIAMCINOLONE ACETONIDE 0.5 % EX OINT: 1.0000 "application " | TOPICAL_OINTMENT | Freq: Two times a day (BID) | CUTANEOUS | Status: DC

## 2015-01-12 NOTE — ED Provider Notes (Signed)
CSN: [id number]     Arrival date & time 01/12/15  0907 History   First MD Initiated Contact with Patient 01/12/15 [phone]     Chief Complaint  Patient presents with  . URI    HPI Comments: 64 year old female presents today complaining of recurrent cough. Patient reports she has a cough for the past 4 weeks. She has a long history of COPD and asthma. She quit smoking last year. She reports that her daughter still smokes inside her home. She has not had any shortness of breath or wheezing. She has been using her inhaler, but this does not help the cough. She has not had a fever, sweats, chills or joint pain. Patient is also concerned about insect bites to lower leg that she noticed yesterday. They itch all the time, but are not painful.  Patient is a 64 y.o. female presenting with URI. The history is provided by the patient.  URI Presenting symptoms: congestion, cough and rhinorrhea   Presenting symptoms: no fever   Severity:  Moderate Onset quality:  Gradual Duration:  4 weeks Timing:  Constant Progression:  Unchanged Chronicity:  Recurrent Relieved by:  None tried Worsened by:  Breathing Ineffective treatments:  Inhaler Associated symptoms: no arthralgias, no myalgias, no sneezing and no wheezing   Risk factors: chronic respiratory disease     Past Medical History  Diagnosis Date  . Hypertension   . COPD (chronic obstructive pulmonary disease)   . Asthma    Past Surgical History  Procedure Laterality Date  . Cholecystectomy    . Ankle surgery Right    No family history on file. History  Substance Use Topics  . Smoking status: Former Research scientist (life sciences)  . Smokeless tobacco: Not on file  . Alcohol Use: No   OB History    No data available     Review of Systems  Constitutional: Negative for fever.  HENT: Positive for congestion and rhinorrhea. Negative for sneezing.   Respiratory: Positive for cough. Negative for wheezing.   Musculoskeletal: Negative for myalgias and arthralgias.   Skin: Positive for rash.  All other systems reviewed and are negative.     Allergies  Codeine  Home Medications   Prior to Admission medications   Medication Sig Start Date End Date Taking? Authorizing Provider  aspirin 81 MG chewable tablet Chew 81 mg by mouth daily.    Historical Provider, MD  benzonatate (TESSALON PERLES) 100 MG capsule Take 2 capsules (200 mg total) by mouth 3 (three) times daily as needed for cough. 12/25/14 12/25/15  Johnn Hai, PA-C  ciprofloxacin (CIPRO) 500 MG tablet Take 1 tablet (500 mg total) by mouth 2 (two) times daily. 12/22/14   Johnn Hai, PA-C  doxycycline (VIBRAMYCIN) 100 MG capsule Take 1 capsule (100 mg total) by mouth 2 (two) times daily. 01/12/15 01/22/15  Shayne Alken V, PA-C  enalapril (VASOTEC) 10 MG tablet Take 10 mg by mouth 2 (two) times daily.    Historical Provider, MD  triamcinolone ointment (KENALOG) 0.5 % Apply 1 application topically 2 (two) times daily. 01/12/15   Shayne Alken V, PA-C   BP 157/84 mmHg  Temp(Src) 98.1 F (36.7 C) (Oral)  Resp 20  Ht 5\' 2"  (1.575 m)  Wt 166 lb (75.297 kg)  BMI 30.35 kg/m2  SpO2 99% Physical Exam  Constitutional: She is oriented to person, place, and time. Vital signs are normal. She appears well-developed and well-nourished.  Non-toxic appearance. She does not have a sickly appearance. She does not  appear ill.  HENT:  Head: Normocephalic and atraumatic.  Right Ear: Tympanic membrane and external ear normal.  Left Ear: Tympanic membrane and external ear normal.  Nose: Nose normal.  Mouth/Throat: Oropharynx is clear and moist.  Eyes: Conjunctivae and EOM are normal. Pupils are equal, round, and reactive to light.  Neck: Normal range of motion. Neck supple.  Cardiovascular: Normal rate, regular rhythm and normal heart sounds.  Exam reveals no gallop and no friction rub.   No murmur heard. Pulmonary/Chest: Breath sounds normal. No respiratory distress. She has no wheezes. She has no rales.   Musculoskeletal: Normal range of motion.  Lymphadenopathy:    She has no cervical adenopathy.  Neurological: She is alert and oriented to person, place, and time.  Skin: Skin is warm and dry. Rash noted.  Erythematous papules noted to bilateral lower extremities  Psychiatric: She has a normal mood and affect. Her behavior is normal. Judgment and thought content normal.  Nursing note and vitals reviewed.   ED Course  Procedures (including critical care time) Labs Review Labs Reviewed - No data to display  Imaging Review Dg Chest 2 View  01/12/2015   CLINICAL DATA:  Cough and upper respiratory infection for 1 month  EXAM: CHEST  2 VIEW  COMPARISON:  Dec 25, 2014  FINDINGS: There is no edema or consolidation. Heart size and pulmonary vascularity are normal. No adenopathy. There are calcified breast implants bilaterally.  IMPRESSION: No edema or consolidation.   Electronically Signed   By: Lowella Grip III M.D.   On: 01/12/2015 10:20     EKG Interpretation None      MDM  Reviewed chest x-ray it is normal. Advised patient to continue her inhalers as prescribed. We'll cover her with doxycycline 10 day course. Warned patient about increased sensitivity to sun. Prescription of triamcinolone cream for insect bites. These do not appear like spider bites or tick bites. Final diagnoses:  Bronchitis  Insect bites        Corliss Parish, PA-C 01/12/15 [address] V, PA-C 01/12/15 1120  Harvest Dark, MD 01/12/15 1441

## 2015-01-12 NOTE — ED Notes (Signed)
Pt complains of a dry cough for the last month, pt reports multiple insect bites on bilateral legs , pt states" I want to make sure the bites are not spider bites"

## 2015-02-02 ENCOUNTER — Emergency Department: Payer: BLUE CROSS/BLUE SHIELD

## 2015-02-02 ENCOUNTER — Encounter: Payer: Self-pay | Admitting: *Deleted

## 2015-02-02 ENCOUNTER — Emergency Department
Admission: EM | Admit: 2015-02-02 | Discharge: 2015-02-02 | Disposition: A | Discharge status: Home / Self Care | Payer: BLUE CROSS/BLUE SHIELD | Attending: Emergency Medicine | Admitting: Emergency Medicine

## 2015-02-02 DIAGNOSIS — T677XXA Heat edema, initial encounter: Secondary | ICD-10-CM | POA: Diagnosis not present

## 2015-02-02 DIAGNOSIS — L740 Miliaria rubra: Secondary | ICD-10-CM | POA: Insufficient documentation

## 2015-02-02 DIAGNOSIS — R55 Syncope and collapse: Secondary | ICD-10-CM

## 2015-02-02 DIAGNOSIS — Z87891 Personal history of nicotine dependence: Secondary | ICD-10-CM | POA: Diagnosis not present

## 2015-02-02 DIAGNOSIS — Y998 Other external cause status: Secondary | ICD-10-CM | POA: Diagnosis not present

## 2015-02-02 DIAGNOSIS — W92XXXA Exposure to excessive heat of man-made origin, initial encounter: Secondary | ICD-10-CM | POA: Diagnosis not present

## 2015-02-02 DIAGNOSIS — Y9289 Other specified places as the place of occurrence of the external cause: Secondary | ICD-10-CM | POA: Diagnosis not present

## 2015-02-02 DIAGNOSIS — Y9389 Activity, other specified: Secondary | ICD-10-CM | POA: Diagnosis not present

## 2015-02-02 DIAGNOSIS — R42 Dizziness and giddiness: Secondary | ICD-10-CM | POA: Diagnosis present

## 2015-02-02 DIAGNOSIS — I10 Essential (primary) hypertension: Secondary | ICD-10-CM | POA: Diagnosis not present

## 2015-02-02 DIAGNOSIS — R609 Edema, unspecified: Secondary | ICD-10-CM

## 2015-02-02 DIAGNOSIS — Z79899 Other long term (current) drug therapy: Secondary | ICD-10-CM | POA: Diagnosis not present

## 2015-02-02 LAB — TROPONIN I: Troponin I: <0.03 ng/mL (ref <0.031)

## 2015-02-02 LAB — CBC
HCT: 38.9 % (ref 35.0–47.0)
Hemoglobin: 13.1 g/dL (ref 12.0–16.0)
MCH: 29.1 pg (ref 26.0–34.0)
MCHC: 33.8 g/dL (ref 32.0–36.0)
MCV: 86.1 fL (ref 80.0–100.0)
Platelets: 237 10*3/uL (ref 150–440)
RBC: 4.52 MIL/uL (ref 3.80–5.20)
RDW: 12.6 % (ref 11.5–14.5)
WBC: 5.6 10*3/uL (ref 3.6–11.0)

## 2015-02-02 LAB — BASIC METABOLIC PANEL
Anion gap: 6 (ref 5–15)
BUN: 19 mg/dL (ref 6–20)
CALCIUM: 9 mg/dL (ref 8.9–10.3)
CO2: 27 mmol/L (ref 22–32)
CREATININE: 0.69 mg/dL (ref 0.44–1.00)
Chloride: 106 mmol/L (ref 101–111)
Glucose, Bld: 98 mg/dL (ref 65–99)
Potassium: 3.9 mmol/L (ref 3.5–5.1)
Sodium: 139 mmol/L (ref 135–145)

## 2015-02-02 NOTE — ED Notes (Signed)
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

## 2015-02-02 NOTE — ED Provider Notes (Signed)
[facility] Emergency Department Provider Note  ____________________________________________  Time seen: Approximately 947 PM  I have reviewed the triage vital signs and the nursing notes.   HISTORY  Chief Complaint Dizziness    HPI Andrea Ford is a 64 y.o. female with a history of hypertension and COPD who presents today with an episode of lightheadedness and near syncope. 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 She says that recently she has been getting leg swelling as well as a red rash to her bilateral lower extremities. 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 She says today while working her shift she became lightheaded. Denies any dizziness. Was sweating and having some associated shortness of breath with mild chest pressure. She told her boss who brought her to an area of the warehouse which was cooler. She was able to drink some water and the symptoms have since improved. She says at this point she just feels weak. She says that she has been having these symptoms over the past few weeks at work and she feels like they have just been getting worse. She says that her lower extremity edema has been present for the past several weeks and has been improving overnight and when she wakes up in the morning.     Past Medical History  Diagnosis Date  . Hypertension   . COPD (chronic obstructive pulmonary disease)   . Asthma     There are no active problems to display for this patient.   Past Surgical History  Procedure Laterality Date  . Cholecystectomy    . Ankle surgery Right     Current Outpatient Rx  Name  Route  Sig  Dispense  Refill  . albuterol (PROVENTIL HFA;VENTOLIN HFA) 108 (90 BASE) MCG/ACT inhaler   Inhalation   Inhale into the lungs every 6 (six) hours as needed for wheezing or shortness of breath.         [name] aspirin 81 MG chewable tablet    Oral   Chew 81 mg by mouth daily.         . Cholecalciferol (VITAMIN D-3 PO)   Oral   Take 1 tablet by mouth daily.         . enalapril (VASOTEC) 10 MG tablet   Oral   Take 10 mg by mouth 2 (two) times daily.         . benzonatate (TESSALON PERLES) 100 MG capsule   Oral   Take 2 capsules (200 mg total) by mouth 3 (three) times daily as needed for cough. Patient not taking: Reported on 02/02/2015   40 capsule   0   . ciprofloxacin (CIPRO) 500 MG tablet   Oral   Take 1 tablet (500 mg total) by mouth 2 (two) times daily. Patient not taking: Reported on 02/02/2015   14 tablet   0   . triamcinolone ointment (KENALOG) 0.5 %   Topical   Apply 1 application topically 2 (two) times daily. Patient not taking: Reported on 02/02/2015   30 g   0     Allergies Other; Codeine; Diphenhydramine; Etodolac; Meperidine; Prednisone; and Venlafaxine  No family history on file.  Social History History  Substance Use Topics  . Smoking status: Former Research scientist (life sciences)  . Smokeless tobacco: Not on file  . Alcohol Use: No    Review of Systems Constitutional: No fever/chills Eyes: No visual changes.  ENT: No sore throat. Cardiovascular: As above  Respiratory: As above  Gastrointestinal: No abdominal pain.  No nausea, no vomiting.  No diarrhea.  No constipation. Genitourinary: Negative for dysuria. Musculoskeletal: Negative for back pain. Skin: Negative for rash. Neurological: Negative for headaches, focal weakness or numbness.  10-point ROS otherwise negative.  ____________________________________________   PHYSICAL EXAM:  VITAL SIGNS: ED Triage Vitals  Enc Vitals Group     BP 02/02/15 1517 143/94 mmHg     Pulse Rate 02/02/15 1517 92     Resp 02/02/15 1801 14     Temp 02/02/15 1517 98 F (36.7 C)     Temp Source 02/02/15 1517 Oral     SpO2 02/02/15 1517 97 %     Weight 02/02/15 1517 166 lb (75.297 kg)     Height 02/02/15 1517 5\' 2"  (1.575 m)     Head Cir --      Peak Flow --       Pain Score --      Pain Loc --      Pain Edu? --      Excl. in [place]? --     Constitutional: Alert and oriented. Well appearing and in no acute distress. Eyes: Conjunctivae are normal. PERRL. EOMI. Head: Atraumatic. Nose: No congestion/rhinnorhea. Mouth/Throat: Mucous membranes are moist.  Oropharynx non-erythematous. Neck: No stridor.   Cardiovascular: Normal rate, regular rhythm. Grossly normal heart sounds.  Good peripheral circulation. Mild reproducible chest tenderness to the left anterior chest. Respiratory: Normal respiratory effort.  No retractions. Lungs CTAB. Gastrointestinal: Soft and nontender. No distention. No abdominal bruits. No CVA tenderness. Musculoskeletal: Mild lower extremity edema. No erythema or induration. Neurologic:  Normal speech and language. No gross focal neurologic deficits are appreciated. Speech is normal. No gait instability. Skin:  Skin is warm, dry and intact. Mild amount of 2-3 mm red spots to the bilateral lower extremities. Consistent with heat rash. Psychiatric: Mood and affect are normal. Speech and behavior are normal.  ____________________________________________   LABS (all labs ordered are listed, but only abnormal results are displayed)  Labs Reviewed  CBC  BASIC METABOLIC PANEL  TROPONIN I   ____________________________________________  EKG  ED ECG REPORT I, Brevan Luberto,  Youlanda Roys, the attending physician, personally viewed and interpreted this ECG.   Date: 02/02/2015  EKG Time: 1524  Rate: 86  Rhythm: Normal sinus rhythm with sinus arrhythmia.  Axis: Normal axis  Intervals:none  ST&T Change: No ST elevations or depressions. T-wave inversion in lead V2 likely related to lead positioning. ____________________________________________  RADIOLOGY Chest x-ray no acute disease. I personally reviewed the  films.  ____________________________________________   PROCEDURES    ____________________________________________   INITIAL IMPRESSION / ASSESSMENT AND PLAN / ED COURSE  Pertinent labs & imaging results that were available during my care of the patient were reviewed by me and considered in my medical decision making (see chart for details).  ----------------------------------------- 7:41 PM on 02/02/2015 -----------------------------------------  She resting comfortably. Vital stable. We'll discharge to home. Counseled to make sure to take breaks at work and find a cool place to cool off.Drink plenty of water. Nose to sit whenever she can. We'll give work note for tomorrow. Patient is considering switching job secondary to conditions which I think would be the best for her in the long run. However, she says that she needs a job currently for income. Will follow up with primary care doctor [facility]. Nose to keep legs elevated at night to help with edema.  ____________________________________________   FINAL CLINICAL IMPRESSION(S) / ED DIAGNOSES  Final diagnoses:  Heat edema, initial encounter  Peripheral edema  Heat rash  Near syncope      Orbie Pyo, MD 02/02/15 1943

## 2015-02-06 DIAGNOSIS — I10 Essential (primary) hypertension: Secondary | ICD-10-CM | POA: Insufficient documentation

## 2015-03-03 LAB — BASIC METABOLIC PANEL
Anion Gap: 8 (ref 7–16)
BUN: 16 mg/dL
CHLORIDE: 107 mmol/L
Calcium, Total: 9.2 mg/dL
Co2: 26 mmol/L
Creatinine: 0.7 mg/dL
EGFR (African American): >60
EGFR (Non-African Amer.): >60
Glucose: 124 mg/dL — ABNORMAL HIGH
POTASSIUM: 4 mmol/L
Sodium: 141 mmol/L

## 2015-03-03 LAB — CBC
HCT: 40.4 % (ref 35.0–47.0)
HGB: 13.4 g/dL (ref 12.0–16.0)
MCH: 28.3 pg (ref 26.0–34.0)
MCHC: 33.2 g/dL (ref 32.0–36.0)
MCV: 85 fL (ref 80–100)
Platelet: 237 10*3/uL (ref 150–440)
RBC: 4.74 10*6/uL (ref 3.80–5.20)
RDW: 12.8 % (ref 11.5–14.5)
WBC: 5.4 10*3/uL (ref 3.6–11.0)

## 2015-03-03 LAB — TROPONIN I

## 2015-03-07 ENCOUNTER — Emergency Department
Admission: EM | Admit: 2015-03-07 | Discharge: 2015-03-07 | Disposition: A | Discharge status: Home / Self Care | Payer: BLUE CROSS/BLUE SHIELD | Attending: Emergency Medicine | Admitting: Emergency Medicine

## 2015-03-07 ENCOUNTER — Encounter: Payer: Self-pay | Admitting: Emergency Medicine

## 2015-03-07 DIAGNOSIS — W92XXXA Exposure to excessive heat of man-made origin, initial encounter: Secondary | ICD-10-CM | POA: Insufficient documentation

## 2015-03-07 DIAGNOSIS — Z87891 Personal history of nicotine dependence: Secondary | ICD-10-CM | POA: Insufficient documentation

## 2015-03-07 DIAGNOSIS — R112 Nausea with vomiting, unspecified: Secondary | ICD-10-CM | POA: Diagnosis present

## 2015-03-07 DIAGNOSIS — Y9289 Other specified places as the place of occurrence of the external cause: Secondary | ICD-10-CM | POA: Insufficient documentation

## 2015-03-07 DIAGNOSIS — Y9389 Activity, other specified: Secondary | ICD-10-CM | POA: Diagnosis not present

## 2015-03-07 DIAGNOSIS — K529 Noninfective gastroenteritis and colitis, unspecified: Secondary | ICD-10-CM | POA: Insufficient documentation

## 2015-03-07 DIAGNOSIS — T675XXA Heat exhaustion, unspecified, initial encounter: Secondary | ICD-10-CM | POA: Insufficient documentation

## 2015-03-07 DIAGNOSIS — Y990 Civilian activity done for income or pay: Secondary | ICD-10-CM | POA: Diagnosis not present

## 2015-03-07 LAB — URINALYSIS COMPLETE WITH MICROSCOPIC (ARMC ONLY)
BILIRUBIN URINE: NEGATIVE
Bacteria, UA: NONE SEEN
GLUCOSE, UA: NEGATIVE mg/dL
Hgb urine dipstick: NEGATIVE
Ketones, ur: NEGATIVE mg/dL
Leukocytes, UA: NEGATIVE
Nitrite: NEGATIVE
Protein, ur: NEGATIVE mg/dL
RBC / HPF: NONE SEEN RBC/hpf (ref 0–5)
SPECIFIC GRAVITY, URINE: 1.005 (ref 1.005–1.030)
SQUAMOUS EPITHELIAL / LPF: NONE SEEN
WBC UA: NONE SEEN WBC/hpf (ref 0–5)
pH: 5 (ref 5.0–8.0)

## 2015-03-07 LAB — CBC
HCT: 40.9 % (ref 35.0–47.0)
Hemoglobin: 14.1 g/dL (ref 12.0–16.0)
MCH: 29.3 pg (ref 26.0–34.0)
MCHC: 34.5 g/dL (ref 32.0–36.0)
MCV: 85 fL (ref 80.0–100.0)
Platelets: 245 10*3/uL (ref 150–440)
RBC: 4.81 MIL/uL (ref 3.80–5.20)
RDW: 12.6 % (ref 11.5–14.5)
WBC: 8.9 10*3/uL (ref 3.6–11.0)

## 2015-03-07 LAB — COMPREHENSIVE METABOLIC PANEL
ALBUMIN: 4.3 g/dL (ref 3.5–5.0)
ALT: 22 U/L (ref 14–54)
ANION GAP: 10 (ref 5–15)
AST: 23 U/L (ref 15–41)
Alkaline Phosphatase: 70 U/L (ref 38–126)
BILIRUBIN TOTAL: 1.2 mg/dL (ref 0.3–1.2)
BUN: 17 mg/dL (ref 6–20)
CALCIUM: 9.1 mg/dL (ref 8.9–10.3)
CHLORIDE: 102 mmol/L (ref 101–111)
CO2: 23 mmol/L (ref 22–32)
Creatinine, Ser: 0.75 mg/dL (ref 0.44–1.00)
GFR calc non Af Amer: >60 mL/min (ref >60)
Glucose, Bld: 102 mg/dL — ABNORMAL HIGH (ref 65–99)
Potassium: 3.9 mmol/L (ref 3.5–5.1)
Sodium: 135 mmol/L (ref 135–145)
TOTAL PROTEIN: 7.6 g/dL (ref 6.5–8.1)

## 2015-03-07 LAB — LIPASE, BLOOD: LIPASE: 20 U/L — AB (ref 22–51)

## 2015-03-07 MED ORDER — ONDANSETRON HCL 4 MG PO TABS: 4.0000 mg | ORAL_TABLET | Freq: Every day | ORAL | Status: DC | PRN

## 2015-03-07 MED ORDER — ONDANSETRON 4 MG PO TBDP
4.0000 mg | ORAL_TABLET | Freq: Once | ORAL | Status: AC
  Administered 2015-03-07: 4 mg via ORAL
  Filled 2015-03-07: qty 1

## 2015-03-07 MED ORDER — DICYCLOMINE HCL 20 MG PO TABS
20.0000 mg | ORAL_TABLET | Freq: Once | ORAL | Status: AC
  Administered 2015-03-07: 20 mg via ORAL
  Filled 2015-03-07 (×2): qty 1

## 2015-03-07 MED ORDER — DICYCLOMINE HCL 20 MG PO TABS: 20.0000 mg | ORAL_TABLET | Freq: Three times a day (TID) | ORAL | Status: DC | PRN

## 2015-03-07 NOTE — ED Notes (Addendum)
Patient to ED with c/o middle abdomen pain that started yesterday evening accompanied by N/V/D. 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

## 2015-03-07 NOTE — ED Notes (Signed)
E sig not working,inst and rx given to patient

## 2015-03-07 NOTE — Discharge Instructions (Signed)
Heat Stress in the Elderly  Elderly people (people aged 62 years and older) are more prone to heat stress than younger people for several reasons:   Elderly people do not adjust as well as young people to sudden changes in temperature.  They are more likely to have a chronic medical condition that upsets normal body responses to heat.  They are more likely to take prescription medicines that impair the body's ability to regulate its temperature or that inhibit perspiration. HEAT STROKE  Heat stroke is the most serious heat-related illness. It occurs when the body becomes unable to control its temperature. The body temperature rises rapidly. Then the body loses its ability to sweat and is unable to cool down. The body temperature rises to 105 F (40.6 C) or higher within 10 to 15 minutes. Heat stroke can cause death or permanent disability if emergency treatment is not provided. SYMPTOMS  Warning signs vary but may include the following:  An extremely high body temperature (above 103 F (39.4 C)).  Nausea.  Red, hot, and dry skin (no sweating).  Rapid, strong pulse.  Throbbing headache.  Dizziness. HEAT EXHAUSTION  Heat exhaustion is a milder form of heat-related illness. It can develop after several days of exposure to high temperatures and not enough fluids. SYMPTOMS  Warning signs vary but may include the following:   Heavy sweating. Paleness.  Muscle cramps.  Tiredness. Weakness.  Dizziness.  Headache. Nausea or vomiting.  Fainting.  Skin: may be cool and moist.  Pulse rate: fast and weak.  Breathing: fast and shallow. WHAT YOU CAN DO TO PROTECT YOURSELF  You can follow these prevention tips to protect yourself from heat-related stress:   Drink cool, nonalcoholic, non-caffeinated beverages. If your caregiver generally limits the amount of fluid you drink or has you on water pills, ask how much you should drink when the weather is hot. Avoid extremely cold  liquids. They can cause cramps.  Rest.  Take a cool shower, bath, or sponge bath.  If possible, seek an air-conditioned environment. If you do not have air conditioning, visit an air-conditioned shopping mall or [place] to cool off.  Emergency planning/management officer.  If possible, remain indoors in the heat of the day.  Do not engage in strenuous activities. WHAT YOU CAN DO TO HELP PROTECT ELDERLY RELATIVES AND NEIGHBORS  If you have elderly relatives or neighbors, help them protect themselves from heat-related stress.   Visit older adults at risk at least twice a day. Watch them for signs of heat exhaustion or heat stroke.  Take them to air-conditioned locations if they have transportation problems.  Make sure older adults have access to an electric fan whenever possible. WHAT YOU CAN DO FOR SOMEONE WITH HEAT STRESS   If you see any signs of severe heat stress, you may be dealing with a life-threatening emergency. Have someone call for immediate medical assistance while you begin cooling the affected person. Do the following:  Get the person to a shady area.  Cool the person rapidly, using whatever methods you can. For example, immerse the person in a tub of cool water or place the person in a cool shower. Spray the person with cool water from a garden hose or sponge the person with cool water. If the humidity is low, wrap the person in a cool, wet sheet. Fan him/her quickly.  Monitor body temperature. Continue cooling efforts until the body temperature drops to 101 - 102F (38.3  C - 38.9  C).  If emergency medical personnel are delayed, call the hospital emergency room for further instructions.  Do not give the person alcohol to drink.  Get medical care as soon as possible. Document Released: 07/17/2009 Document Revised: 10/21/2011 Document Reviewed: 07/17/2009 [facility] Patient Information 2015 [place], [place]. This information is not intended to replace advice given to you  by your health care provider. Make sure you discuss any questions you have with your health care provider.  Viral Gastroenteritis Viral gastroenteritis is also known as stomach flu. This condition affects the stomach and intestinal tract. It can cause sudden diarrhea and vomiting. The illness typically lasts 3 to 8 days. Most people develop an immune response that eventually gets rid of the virus. While this natural response develops, the virus can make you quite ill. CAUSES  Many different viruses can cause gastroenteritis, such as rotavirus or noroviruses. You can catch one of these viruses by consuming contaminated food or water. You may also catch a virus by sharing utensils or other personal items with an infected person or by touching a contaminated surface. SYMPTOMS  The most common symptoms are diarrhea and vomiting. These problems can cause a severe loss of body fluids (dehydration) and a body salt (electrolyte) imbalance. Other symptoms may include:  Fever.  Headache.  Fatigue.  Abdominal pain. DIAGNOSIS  Your caregiver can usually diagnose viral gastroenteritis based on your symptoms and a physical exam. A stool sample may also be taken to test for the presence of viruses or other infections. TREATMENT  This illness typically goes away on its own. Treatments are aimed at rehydration. The most serious cases of viral gastroenteritis involve vomiting so severely that you are not able to keep fluids down. In these cases, fluids must be given through an intravenous line (IV). HOME CARE INSTRUCTIONS   Drink enough fluids to keep your urine clear or pale yellow. Drink small amounts of fluids frequently and increase the amounts as tolerated.  Ask your caregiver for specific rehydration instructions.  Avoid:  Foods high in sugar.  Alcohol.  Carbonated drinks.  Tobacco.  Juice.  Caffeine drinks.  Extremely hot or cold fluids.  Fatty, greasy foods.  Too much intake of  anything at one time.  Dairy products until 24 to 48 hours after diarrhea stops.  You may consume probiotics. Probiotics are active cultures of beneficial bacteria. They may lessen the amount and number of diarrheal stools in adults. Probiotics can be found in yogurt with active cultures and in supplements.  Wash your hands well to avoid spreading the virus.  Only take over-the-counter or prescription medicines for pain, discomfort, or fever as directed by your caregiver. Do not give aspirin to children. Antidiarrheal medicines are not recommended.  Ask your caregiver if you should continue to take your regular prescribed and over-the-counter medicines.  Keep all follow-up appointments as directed by your caregiver. SEEK IMMEDIATE MEDICAL CARE IF:   You are unable to keep fluids down.  You do not urinate at least once every 6 to 8 hours.  You develop shortness of breath.  You notice blood in your stool or vomit. This may look like coffee grounds.  You have abdominal pain that increases or is concentrated in one small area (localized).  You have persistent vomiting or diarrhea.  You have a fever.  The patient is a child younger than 3 months, and he or she has a fever.  The patient is a child older than 3 months, and he or she has  a fever and persistent symptoms.  The patient is a child older than 3 months, and he or she has a fever and symptoms suddenly get worse.  The patient is a baby, and he or she has no tears when crying. MAKE SURE YOU:   Understand these instructions.  Will watch your condition.  Will get help right away if you are not doing well or get worse. Document Released: 07/29/2005 Document Revised: 10/21/2011 Document Reviewed: 05/15/2011 [facility] Patient Information 2015 [place], [place]. This information is not intended to replace advice given to you by your health care provider. Make sure you discuss any questions you have with your health care  provider.

## 2015-03-07 NOTE — ED Provider Notes (Signed)
[facility] Emergency Department Provider Note     Time seen: ----------------------------------------- 3:31 PM on 03/07/2015 -----------------------------------------    I have reviewed the triage vital signs and the nursing notes.   HISTORY  Chief Complaint Nausea; Emesis; and Diarrhea    HPI Andrea Ford is a 64 y.o. female who presents ER with abdominal pain that started yesterday, pain with nausea vomiting diarrhea. 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 She presents the ER for evaluation for all these symptoms and because her primary care doctor couldn't see her.   Past Medical History  Diagnosis Date  . Hypertension   . COPD (chronic obstructive pulmonary disease)   . Asthma     There are no active problems to display for this patient.   Past Surgical History  Procedure Laterality Date  . Cholecystectomy    . Ankle surgery Right     Allergies Other; Codeine; Diphenhydramine; Etodolac; Meperidine; Prednisone; and Venlafaxine  Social History History  Substance Use Topics  . Smoking status: Former Research scientist (life sciences)  . Smokeless tobacco: Not on file  . Alcohol Use: No    Review of Systems Constitutional: Negative for fever. Eyes: Negative for visual changes. ENT: Negative for sore throat. Cardiovascular: Negative for chest pain. Respiratory: Negative for shortness of breath. Gastrointestinal: Positive for abdominal cramping, vomiting and diarrhea Genitourinary: Negative for dysuria. Musculoskeletal: Negative for back pain. Skin: Negative for rash. Neurological: Negative for headaches, focal weakness or numbness.  10-point ROS otherwise negative.  ____________________________________________   PHYSICAL EXAM:  VITAL SIGNS: ED Triage Vitals  Enc Vitals Group     BP 03/07/15 1424  126/91 mmHg     Pulse Rate 03/07/15 1424 95     Resp 03/07/15 1424 20     Temp 03/07/15 1424 98.5 F (36.9 C)     Temp Source 03/07/15 1424 Oral     SpO2 03/07/15 1424 97 %     Weight 03/07/15 1425 166 lb (75.297 kg)     Height 03/07/15 1425 5\' 2"  (1.575 m)     Head Cir --      Peak Flow --      Pain Score 03/07/15 1425 9     Pain Loc --      Pain Edu? --      Excl. in [place]? --     Constitutional: Alert and oriented. Well appearing and in no distress. Eyes: Conjunctivae are normal. PERRL. Normal extraocular movements. ENT   Head: Normocephalic and atraumatic.   Nose: No congestion/rhinnorhea.   Mouth/Throat: Mucous membranes are moist.   Neck: No stridor. Cardiovascular: Normal rate, regular rhythm. Normal and symmetric distal pulses are present in all extremities. No murmurs, rubs, or gallops. Respiratory: Normal respiratory effort without tachypnea nor retractions. Breath sounds are clear and equal bilaterally. No wheezes/rales/rhonchi. Gastrointestinal: Soft and nontender. No distention. No abdominal bruits.  Musculoskeletal: Nontender with normal range of motion in all extremities. No joint effusions.  No lower extremity tenderness nor edema. Neurologic:  Normal speech and language. No gross focal neurologic deficits are appreciated. Speech is normal. No gait instability. Skin:  Skin is warm, dry and intact. No rash noted. Psychiatric: Mood and affect are normal. Speech and behavior are normal. Patient exhibits appropriate insight and judgment.  ____________________________________________  ED COURSE:  Pertinent labs & imaging results that were available during my care  of the patient were reviewed by me and considered in my medical decision making (see chart for details). Patient is no acute distress, has been on exam. Likely gastroenteritis worsened by ____________________________________________    LABS (pertinent positives/negatives)  Labs Reviewed   LIPASE, BLOOD - Abnormal; Notable for the following:    Lipase 20 (*)    All other components within normal limits  COMPREHENSIVE METABOLIC PANEL - Abnormal; Notable for the following:    Glucose, Bld 102 (*)    All other components within normal limits  URINALYSIS COMPLETEWITH MICROSCOPIC ([facility] ONLY) - Abnormal; Notable for the following:    Color, Urine COLORLESS (*)    APPearance CLEAR (*)    All other components within normal limits  CBC    ____________________________________________  FINAL ASSESSMENT AND PLAN  Gastroenteritis, heat related illness  Plan: Patient with labs and imaging as dictated above. Vital signs are stable this time. She can tolerate things by mouth. She'll be given Bentyl and Zofran. Encouraged to take Imodium for diarrhea. Stable for outpatient follow-up   Earleen Newport, MD   Earleen Newport, MD 03/07/15 [phone]

## 2015-03-08 ENCOUNTER — Encounter: Payer: Self-pay | Admitting: Emergency Medicine

## 2015-03-08 ENCOUNTER — Emergency Department
Admission: EM | Admit: 2015-03-08 | Discharge: 2015-03-08 | Disposition: A | Discharge status: Home / Self Care | Payer: BLUE CROSS/BLUE SHIELD | Attending: Emergency Medicine | Admitting: Emergency Medicine

## 2015-03-08 DIAGNOSIS — Z79899 Other long term (current) drug therapy: Secondary | ICD-10-CM | POA: Insufficient documentation

## 2015-03-08 DIAGNOSIS — Z7982 Long term (current) use of aspirin: Secondary | ICD-10-CM | POA: Diagnosis not present

## 2015-03-08 DIAGNOSIS — I10 Essential (primary) hypertension: Secondary | ICD-10-CM | POA: Diagnosis not present

## 2015-03-08 DIAGNOSIS — R630 Anorexia: Secondary | ICD-10-CM | POA: Insufficient documentation

## 2015-03-08 DIAGNOSIS — Z792 Long term (current) use of antibiotics: Secondary | ICD-10-CM | POA: Insufficient documentation

## 2015-03-08 DIAGNOSIS — R1012 Left upper quadrant pain: Secondary | ICD-10-CM

## 2015-03-08 DIAGNOSIS — Z87891 Personal history of nicotine dependence: Secondary | ICD-10-CM | POA: Diagnosis not present

## 2015-03-08 LAB — URINALYSIS COMPLETE WITH MICROSCOPIC (ARMC ONLY)
BILIRUBIN URINE: NEGATIVE
Bacteria, UA: NONE SEEN
Glucose, UA: NEGATIVE mg/dL
Ketones, ur: NEGATIVE mg/dL
NITRITE: NEGATIVE
PROTEIN: NEGATIVE mg/dL
Specific Gravity, Urine: 1.012 (ref 1.005–1.030)
pH: 5 (ref 5.0–8.0)

## 2015-03-08 LAB — BASIC METABOLIC PANEL
Anion gap: 8 (ref 5–15)
BUN: 13 mg/dL (ref 6–20)
CALCIUM: 8.7 mg/dL — AB (ref 8.9–10.3)
CHLORIDE: 103 mmol/L (ref 101–111)
CO2: 25 mmol/L (ref 22–32)
Creatinine, Ser: 0.67 mg/dL (ref 0.44–1.00)
GFR calc Af Amer: >60 mL/min (ref >60)
GLUCOSE: 109 mg/dL — AB (ref 65–99)
POTASSIUM: 3.8 mmol/L (ref 3.5–5.1)
Sodium: 136 mmol/L (ref 135–145)

## 2015-03-08 LAB — CBC WITH DIFFERENTIAL/PLATELET
BASOS PCT: 1 %
Basophils Absolute: 0 10*3/uL (ref 0–0.1)
Eosinophils Absolute: 0.1 10*3/uL (ref 0–0.7)
Eosinophils Relative: 3 %
HCT: 40.3 % (ref 35.0–47.0)
Hemoglobin: 13.8 g/dL (ref 12.0–16.0)
Lymphocytes Relative: 32 %
Lymphs Abs: 1.4 10*3/uL (ref 1.0–3.6)
MCH: 29.2 pg (ref 26.0–34.0)
MCHC: 34.3 g/dL (ref 32.0–36.0)
MCV: 85.2 fL (ref 80.0–100.0)
Monocytes Absolute: 0.4 10*3/uL (ref 0.2–0.9)
Monocytes Relative: 9 %
NEUTROS PCT: 55 %
Neutro Abs: 2.4 10*3/uL (ref 1.4–6.5)
PLATELETS: 216 10*3/uL (ref 150–440)
RBC: 4.73 MIL/uL (ref 3.80–5.20)
RDW: 12.9 % (ref 11.5–14.5)
WBC: 4.3 10*3/uL (ref 3.6–11.0)

## 2015-03-08 MED ORDER — SUCRALFATE 1 G PO TABS: 1.0000 g | ORAL_TABLET | Freq: Four times a day (QID) | ORAL | Status: DC

## 2015-03-08 MED ORDER — RANITIDINE HCL 150 MG PO CAPS: 150.0000 mg | ORAL_CAPSULE | Freq: Two times a day (BID) | ORAL | Status: DC

## 2015-03-08 MED ORDER — GI COCKTAIL ~~LOC~~
30.0000 mL | Freq: Once | ORAL | Status: AC
  Administered 2015-03-08: 30 mL via ORAL
  Filled 2015-03-08: qty 30

## 2015-03-08 NOTE — ED Notes (Signed)
abd pain x2 days , was seen here yesterday. Today worsening , last night after BM had bright red blood of Toilet paper.

## 2015-03-08 NOTE — ED Provider Notes (Signed)
[facility] Emergency Department Provider Note   ____________________________________________  Time seen: 1445  I have reviewed the triage vital signs and the nursing notes.   HISTORY  Chief Complaint Abdominal Pain   History limited by: Not Limited   HPI Andrea Ford is a 64 y.o. female who presents to the emergency department today because of continued abdominal pain. She states that the pain is located in her left upper quadrant. She states it feels like a bruise. It has been constant. It is worse with palpation. She has had some associated decreased appetite. She denies any vomiting. She does have some associated diarrhea. She was seen in the emergency department yesterday for the symptoms and was given Bentyl and Zofran. Patient states she has continued to have the pain.   Past Medical History  Diagnosis Date  . Hypertension   . COPD (chronic obstructive pulmonary disease)   . Asthma     There are no active problems to display for this patient.   Past Surgical History  Procedure Laterality Date  . Cholecystectomy    . Ankle surgery Right   . Tubal ligation      Current Outpatient Rx  Name  Route  Sig  Dispense  Refill  . albuterol (PROVENTIL HFA;VENTOLIN HFA) 108 (90 BASE) MCG/ACT inhaler   Inhalation   Inhale into the lungs every 6 (six) hours as needed for wheezing or shortness of breath.         [name] aspirin 81 MG chewable tablet   Oral   Chew 81 mg by mouth daily.         . benzonatate (TESSALON PERLES) 100 MG capsule   Oral   Take 2 capsules (200 mg total) by mouth 3 (three) times daily as needed for cough. Patient not taking: Reported on 02/02/2015   40 capsule   0   . Cholecalciferol (VITAMIN D-3 PO)   Oral   Take 1 tablet by mouth daily.         . ciprofloxacin (CIPRO) 500 MG tablet   Oral   Take 1 tablet (500 mg total) by mouth 2 (two) times daily. Patient not taking: Reported on 02/02/2015   14 tablet   0   .  dicyclomine (BENTYL) 20 MG tablet   Oral   Take 1 tablet (20 mg total) by mouth 3 (three) times daily as needed for spasms.   20 tablet   0   . enalapril (VASOTEC) 10 MG tablet   Oral   Take 10 mg by mouth 2 (two) times daily.         . ondansetron (ZOFRAN) 4 MG tablet   Oral   Take 1 tablet (4 mg total) by mouth daily as needed for nausea or vomiting.   20 tablet   1   . triamcinolone ointment (KENALOG) 0.5 %   Topical   Apply 1 application topically 2 (two) times daily. Patient not taking: Reported on 02/02/2015   30 g   0     Allergies Other; Codeine; Diphenhydramine; Etodolac; Meperidine; Prednisone; and Venlafaxine  No family history on file.  Social History History  Substance Use Topics  . Smoking status: Former Research scientist (life sciences)  . Smokeless tobacco: Not on file  . Alcohol Use: No    Review of Systems  Constitutional: Negative for fever. Cardiovascular: Negative for chest pain. Respiratory: Negative for shortness of breath. Gastrointestinal: Positive for left upper quadrant pain Genitourinary: Negative for dysuria. Musculoskeletal: Negative for back pain.  Skin: Negative for rash. Neurological: Negative for headaches, focal weakness or numbness.   10-point ROS otherwise negative.  ____________________________________________   PHYSICAL EXAM:  VITAL SIGNS: ED Triage Vitals  Enc Vitals Group     BP 03/08/15 1356 142/98 mmHg     Pulse Rate 03/08/15 1356 82     Resp --      Temp 03/08/15 1356 98.6 F (37 C)     Temp Source 03/08/15 1356 Oral     SpO2 03/08/15 1356 96 %     Weight 03/08/15 1356 166 lb (75.297 kg)     Height 03/08/15 1356 5\' 2"  (1.575 m)     Head Cir --      Peak Flow --      Pain Score 03/08/15 1356 10   Constitutional: Alert and oriented. Well appearing and in no distress. Eyes: Conjunctivae are normal. PERRL. Normal extraocular movements. ENT   Head: Normocephalic and atraumatic.   Nose: No congestion/rhinnorhea.    Mouth/Throat: Mucous membranes are moist.   Neck: No stridor. Hematological/Lymphatic/Immunilogical: No cervical lymphadenopathy. Cardiovascular: Normal rate, regular rhythm.  No murmurs, rubs, or gallops. Respiratory: Normal respiratory effort without tachypnea nor retractions. Breath sounds are clear and equal bilaterally. No wheezes/rales/rhonchi. Gastrointestinal: Soft and minimally tender in the left upper quadrant and epigastric region. Genitourinary: Deferred Musculoskeletal: Normal range of motion in all extremities. No joint effusions.  No lower extremity tenderness nor edema. Neurologic:  Normal speech and language. No gross focal neurologic deficits are appreciated. Speech is normal.  Skin:  Skin is warm, dry and intact. No rash noted. Psychiatric: Mood and affect are normal. Speech and behavior are normal. Patient exhibits appropriate insight and judgment.  ____________________________________________    LABS (pertinent positives/negatives)  Labs Reviewed  BASIC METABOLIC PANEL - Abnormal; Notable for the following:    Glucose, Bld 109 (*)    Calcium 8.7 (*)    All other components within normal limits  URINALYSIS COMPLETEWITH MICROSCOPIC ([facility] ONLY) - Abnormal; Notable for the following:    Color, Urine YELLOW (*)    APPearance CLEAR (*)    Hgb urine dipstick 2+ (*)    Leukocytes, UA TRACE (*)    Squamous Epithelial / LPF 0-5 (*)    All other components within normal limits  CBC WITH DIFFERENTIAL/PLATELET     ____________________________________________   EKG  None  ____________________________________________    RADIOLOGY  None  ____________________________________________   PROCEDURES  Procedure(s) performed: None  Critical Care performed: No  ____________________________________________   INITIAL IMPRESSION / ASSESSMENT AND PLAN / ED COURSE  Pertinent labs & imaging results that were available during my care of the patient were reviewed  by me and considered in my medical decision making (see chart for details).  Patient presents to the emergency department with continued left upper quadrant and epigastric abdominal pain. Patient with very minimal tenderness palpation left upper quadrant on physical exam. Patient did state that her pain improved after GI cocktail. At this point given lack of fever, lack of leukocytosis I do not feel emergent radiographic studies are recommended. Will plan on giving patient medications for gastritis outpatient follow-up. Did discuss return precautions.   ____________________________________________   FINAL CLINICAL IMPRESSION(S) / ED DIAGNOSES  Final diagnoses:  Left upper quadrant pain     Nance Pear, MD 03/08/15 1707

## 2015-03-08 NOTE — Discharge Instructions (Signed)
Please seek medical attention for any high fevers, chest pain, shortness of breath, change in behavior, persistent vomiting, bloody stool or any other new or concerning symptoms.  Abdominal Pain Many things can cause abdominal pain. Usually, abdominal pain is not caused by a disease and will improve without treatment. It can often be observed and treated at home. Your health care provider will do a physical exam and possibly order blood tests and X-rays to help determine the seriousness of your pain. However, in many cases, more time must pass before a clear cause of the pain can be found. Before that point, your health care provider may not know if you need more testing or further treatment. HOME CARE INSTRUCTIONS  Monitor your abdominal pain for any changes. The following actions may help to alleviate any discomfort you are experiencing:  Only take over-the-counter or prescription medicines as directed by your health care provider.  Do not take laxatives unless directed to do so by your health care provider.  Try a clear liquid diet (broth, tea, or water) as directed by your health care provider. Slowly move to a bland diet as tolerated. SEEK MEDICAL CARE IF:  You have unexplained abdominal pain.  You have abdominal pain associated with nausea or diarrhea.  You have pain when you urinate or have a bowel movement.  You experience abdominal pain that wakes you in the night.  You have abdominal pain that is worsened or improved by eating food.  You have abdominal pain that is worsened with eating fatty foods.  You have a fever. SEEK IMMEDIATE MEDICAL CARE IF:   Your pain does not go away within 2 hours.  You keep throwing up (vomiting).  Your pain is felt only in portions of the abdomen, such as the right side or the left lower portion of the abdomen.  You pass bloody or black tarry stools. MAKE SURE YOU:  Understand these instructions.   Will watch your condition.   Will  get help right away if you are not doing well or get worse.  Document Released: 05/08/2005 Document Revised: 08/03/2013 Document Reviewed: 04/07/2013 [facility] Patient Information 2015 [place], [place]. This information is not intended to replace advice given to you by your health care provider. Make sure you discuss any questions you have with your health care provider.

## 2015-05-03 DIAGNOSIS — Z87891 Personal history of nicotine dependence: Secondary | ICD-10-CM | POA: Insufficient documentation

## 2015-05-03 DIAGNOSIS — R9389 Abnormal findings on diagnostic imaging of other specified body structures: Secondary | ICD-10-CM | POA: Insufficient documentation

## 2015-05-03 DIAGNOSIS — F17210 Nicotine dependence, cigarettes, uncomplicated: Secondary | ICD-10-CM | POA: Insufficient documentation

## 2015-05-03 DIAGNOSIS — R053 Chronic cough: Secondary | ICD-10-CM | POA: Insufficient documentation

## 2015-08-28 ENCOUNTER — Emergency Department
Admission: EM | Admit: 2015-08-28 | Discharge: 2015-08-28 | Disposition: A | Discharge status: Home / Self Care | Payer: BLUE CROSS/BLUE SHIELD | Attending: Emergency Medicine | Admitting: Emergency Medicine

## 2015-08-28 ENCOUNTER — Encounter: Payer: Self-pay | Admitting: *Deleted

## 2015-08-28 DIAGNOSIS — Z87891 Personal history of nicotine dependence: Secondary | ICD-10-CM | POA: Diagnosis not present

## 2015-08-28 DIAGNOSIS — I10 Essential (primary) hypertension: Secondary | ICD-10-CM | POA: Diagnosis not present

## 2015-08-28 DIAGNOSIS — R197 Diarrhea, unspecified: Secondary | ICD-10-CM | POA: Diagnosis not present

## 2015-08-28 DIAGNOSIS — R1012 Left upper quadrant pain: Secondary | ICD-10-CM | POA: Diagnosis not present

## 2015-08-28 LAB — COMPREHENSIVE METABOLIC PANEL
ALBUMIN: 4.2 g/dL (ref 3.5–5.0)
ALT: 20 U/L (ref 14–54)
AST: 20 U/L (ref 15–41)
Alkaline Phosphatase: 66 U/L (ref 38–126)
Anion gap: 9 (ref 5–15)
BUN: 14 mg/dL (ref 6–20)
CO2: 24 mmol/L (ref 22–32)
Calcium: 9.1 mg/dL (ref 8.9–10.3)
Chloride: 103 mmol/L (ref 101–111)
Creatinine, Ser: 0.67 mg/dL (ref 0.44–1.00)
GFR calc Af Amer: >60 mL/min (ref >60)
GFR calc non Af Amer: >60 mL/min (ref >60)
GLUCOSE: 95 mg/dL (ref 65–99)
POTASSIUM: 3.7 mmol/L (ref 3.5–5.1)
SODIUM: 136 mmol/L (ref 135–145)
Total Bilirubin: 1.4 mg/dL — ABNORMAL HIGH (ref 0.3–1.2)
Total Protein: 7.5 g/dL (ref 6.5–8.1)

## 2015-08-28 LAB — CBC
HCT: 41.2 % (ref 35.0–47.0)
HEMOGLOBIN: 13.9 g/dL (ref 12.0–16.0)
MCH: 28.2 pg (ref 26.0–34.0)
MCHC: 33.8 g/dL (ref 32.0–36.0)
MCV: 83.5 fL (ref 80.0–100.0)
Platelets: 227 10*3/uL (ref 150–440)
RBC: 4.93 MIL/uL (ref 3.80–5.20)
RDW: 12.7 % (ref 11.5–14.5)
WBC: 8.7 10*3/uL (ref 3.6–11.0)

## 2015-08-28 LAB — URINALYSIS COMPLETE WITH MICROSCOPIC (ARMC ONLY)
Bilirubin Urine: NEGATIVE
Glucose, UA: NEGATIVE mg/dL
Ketones, ur: NEGATIVE mg/dL
NITRITE: NEGATIVE
PH: 5 (ref 5.0–8.0)
PROTEIN: NEGATIVE mg/dL
Specific Gravity, Urine: 1.025 (ref 1.005–1.030)

## 2015-08-28 LAB — LIPASE, BLOOD: Lipase: 24 U/L (ref 11–51)

## 2015-08-28 MED ORDER — ONDANSETRON HCL 4 MG/2ML IJ SOLN
4.0000 mg | Freq: Once | INTRAMUSCULAR | Status: AC
  Administered 2015-08-28: 4 mg via INTRAVENOUS
  Filled 2015-08-28: qty 2

## 2015-08-28 MED ORDER — DICYCLOMINE HCL 20 MG PO TABS: 20.0000 mg | ORAL_TABLET | Freq: Three times a day (TID) | ORAL | Status: DC | PRN

## 2015-08-28 MED ORDER — SODIUM CHLORIDE 0.9 % IV SOLN
Freq: Once | INTRAVENOUS | Status: AC
  Administered 2015-08-28: 18:00:00 via INTRAVENOUS

## 2015-08-28 MED ORDER — DIPHENOXYLATE-ATROPINE 2.5-0.025 MG PO TABS: 1.0000 | ORAL_TABLET | Freq: Four times a day (QID) | ORAL | Status: DC | PRN

## 2015-08-28 NOTE — Discharge Instructions (Signed)
Abdominal Pain, Adult °Many things can cause abdominal pain. Usually, abdominal pain is not caused by a disease and will improve without treatment. It can often be observed and treated at home. Your health care provider will do a physical exam and possibly order blood tests and X-rays to help determine the seriousness of your pain. However, in many cases, more time must pass before a clear cause of the pain can be found. Before that point, your health care provider may not know if you need more testing or further treatment. °HOME CARE INSTRUCTIONS °Monitor your abdominal pain for any changes. The following actions may help to alleviate any discomfort you are experiencing: °· Only take over-the-counter or prescription medicines as directed by your health care provider. °· Do not take laxatives unless directed to do so by your health care provider. °· Try a clear liquid diet (broth, tea, or water) as directed by your health care provider. Slowly move to a bland diet as tolerated. °SEEK MEDICAL CARE IF: °· You have unexplained abdominal pain. °· You have abdominal pain associated with nausea or diarrhea. °· You have pain when you urinate or have a bowel movement. °· You experience abdominal pain that wakes you in the night. °· You have abdominal pain that is worsened or improved by eating food. °· You have abdominal pain that is worsened with eating fatty foods. °· You have a fever. °SEEK IMMEDIATE MEDICAL CARE IF: °· Your pain does not go away within 2 hours. °· You keep throwing up (vomiting). °· Your pain is felt only in portions of the abdomen, such as the right side or the left lower portion of the abdomen. °· You pass bloody or black tarry stools. °MAKE SURE YOU: °· Understand these instructions. °· Will watch your condition. °· Will get help right away if you are not doing well or get worse. °  °This information is not intended to replace advice given to you by your health care provider. Make sure you discuss  any questions you have with your health care provider. °  °Document Released: 05/08/2005 Document Revised: 04/19/2015 Document Reviewed: 04/07/2013 °Elsevier Interactive Patient Education ©2016 [employer] ° °Diarrhea °Diarrhea is frequent loose and watery bowel movements. It can cause you to feel weak and dehydrated. Dehydration can cause you to become tired and thirsty, have a dry mouth, and have decreased urination that often is dark yellow. Diarrhea is a sign of another problem, most often an infection that will not last long. In most cases, diarrhea typically lasts 2-3 days. However, it can last longer if it is a sign of something more serious. It is important to treat your diarrhea as directed by your caregiver to lessen or prevent future episodes of diarrhea. °CAUSES  °Some common causes include: °· Gastrointestinal infections caused by viruses, bacteria, or parasites. °· Food poisoning or food allergies. °· Certain medicines, such as antibiotics, chemotherapy, and laxatives. °· Artificial sweeteners and fructose. °· Digestive disorders. °HOME CARE INSTRUCTIONS °· Ensure adequate fluid intake (hydration): Have 1 cup (8 oz) of fluid for each diarrhea episode. Avoid fluids that contain simple sugars or sports drinks, fruit juices, whole milk products, and sodas. Your urine should be clear or pale yellow if you are drinking enough fluids. Hydrate with an oral rehydration solution that you can purchase at pharmacies, retail stores, and online. You can prepare an oral rehydration solution at home by mixing the following ingredients together: °¨  - tsp table salt. °¨ ¾ tsp baking soda. °¨    tsp salt substitute containing potassium chloride. °¨ 1  tablespoons sugar. °¨ 1 L (34 oz) of water. °· Certain foods and beverages may increase the speed at which food moves through the gastrointestinal (GI) tract. These foods and beverages should be avoided and include: °¨ Caffeinated and alcoholic beverages. °¨ High-fiber  foods, such as raw fruits and vegetables, nuts, seeds, and whole grain breads and cereals. °¨ Foods and beverages sweetened with sugar alcohols, such as xylitol, sorbitol, and mannitol. °· Some foods may be well tolerated and may help thicken stool including: °¨ Starchy foods, such as rice, toast, pasta, low-sugar cereal, oatmeal, grits, baked potatoes, crackers, and bagels. °¨ Bananas. °¨ Applesauce. °· Add probiotic-rich foods to help increase healthy bacteria in the GI tract, such as yogurt and fermented milk products. °· Wash your hands well after each diarrhea episode. °· Only take over-the-counter or prescription medicines as directed by your caregiver. °· Take a warm bath to relieve any burning or pain from frequent diarrhea episodes. °SEEK IMMEDIATE MEDICAL CARE IF:  °· You are unable to keep fluids down. °· You have persistent vomiting. °· You have blood in your stool, or your stools are black and tarry. °· You do not urinate in 6-8 hours, or there is only a small amount of very dark urine. °· You have abdominal pain that increases or localizes. °· You have weakness, dizziness, confusion, or light-headedness. °· You have a severe headache. °· Your diarrhea gets worse or does not get better. °· You have a fever or persistent symptoms for more than 2-3 days. °· You have a fever and your symptoms suddenly get worse. °MAKE SURE YOU:  °· Understand these instructions. °· Will watch your condition. °· Will get help right away if you are not doing well or get worse. °  °This information is not intended to replace advice given to you by your health care provider. Make sure you discuss any questions you have with your health care provider. °  °Document Released: 07/19/2002 Document Revised: 08/19/2014 Document Reviewed: 04/05/2012 °Elsevier Interactive Patient Education ©2016 [employer] ° °

## 2015-08-28 NOTE — ED Notes (Addendum)
States diaherra and left lower quad abd pain radiating to her back, states weakness, states a known fibroid on her uterus

## 2015-08-28 NOTE — ED Provider Notes (Signed)
[facility] Emergency Department Provider Note     Time seen: ----------------------------------------- 5:18 PM on 08/28/2015 -----------------------------------------    I have reviewed the triage vital signs and the nursing notes.   HISTORY  Chief Complaint Diarrhea and Abdominal Pain    HPI Andrea Ford is a 65 y.o. female who presents ER with diarrhea and left upper abdominal pain that radiates into her back.Patient states she had this before, she thought having her gallbladder taken out would alleviate her symptoms. Patient denies ears, chills, chest pain, shortness of breath, nausea vomiting but has had diarrhea.   Past Medical History  Diagnosis Date  . Hypertension   . COPD (chronic obstructive pulmonary disease) ([place])   . Asthma     There are no active problems to display for this patient.   Past Surgical History  Procedure Laterality Date  . Cholecystectomy    . Ankle surgery Right   . Tubal ligation      Allergies Other; Codeine; Diphenhydramine; Etodolac; Meperidine; Prednisone; and Venlafaxine  Social History Social History  Substance Use Topics  . Smoking status: Former Research scientist (life sciences)  . Smokeless tobacco: None  . Alcohol Use: No    Review of Systems Constitutional: Negative for fever. Eyes: Negative for visual changes. ENT: Negative for sore throat. Cardiovascular: Negative for chest pain. Respiratory: Negative for shortness of breath. Gastrointestinal: Positive for left-sided abdominal pain and diarrhea Genitourinary: Negative for dysuria. Musculoskeletal: Negative for back pain. Skin: Negative for rash. Neurological: Negative for headaches, positive for weakness  10-point ROS otherwise negative.  ____________________________________________   PHYSICAL EXAM:  VITAL SIGNS: ED Triage Vitals  Enc Vitals Group     BP 08/28/15 1549 112/72 mmHg     Pulse Rate 08/28/15 1549 90     Resp 08/28/15 1549 18     Temp  08/28/15 1549 98.1 F (36.7 C)     Temp Source 08/28/15 1549 Oral     SpO2 08/28/15 1549 100 %     Weight 08/28/15 1549 166 lb (75.297 kg)     Height 08/28/15 1549 5\' 3"  (1.6 m)     Head Cir --      Peak Flow --      Pain Score 08/28/15 1607 8     Pain Loc --      Pain Edu? --      Excl. in [place]? --     Constitutional: Alert and oriented. Well appearing and in no distress. Eyes: Conjunctivae are normal. PERRL. Normal extraocular movements. ENT   Head: Normocephalic and atraumatic.   Nose: No congestion/rhinnorhea.   Mouth/Throat: Mucous membranes are moist.   Neck: No stridor. Cardiovascular: Normal rate, regular rhythm. Normal and symmetric distal pulses are present in all extremities. No murmurs, rubs, or gallops. Respiratory: Normal respiratory effort without tachypnea nor retractions. Breath sounds are clear and equal bilaterally. No wheezes/rales/rhonchi. Gastrointestinal: Soft and nontender. No distention. No abdominal bruits.  Musculoskeletal: Nontender with normal range of motion in all extremities. No joint effusions.  No lower extremity tenderness nor edema. Neurologic:  Normal speech and language. No gross focal neurologic deficits are appreciated. Speech is normal.  Skin:  Skin is warm, dry and intact. No rash noted. Psychiatric: Mood and affect are normal. Speech and behavior are normal. Patient exhibits appropriate insight and judgment. ____________________________________________  ED COURSE:  Pertinent labs & imaging results that were available during my care of the patient were reviewed by me and considered in my medical decision making (see chart  for details). Nonspecific symptoms, likely viral. I will check basic labs and reevaluate. ____________________________________________    LABS (pertinent positives/negatives)  Labs Reviewed  COMPREHENSIVE METABOLIC PANEL - Abnormal; Notable for the following:    Total Bilirubin 1.4 (*)    All other  components within normal limits  URINALYSIS COMPLETEWITH MICROSCOPIC ([facility] ONLY) - Abnormal; Notable for the following:    Color, Urine YELLOW (*)    APPearance CLEAR (*)    Hgb urine dipstick 2+ (*)    Leukocytes, UA TRACE (*)    Bacteria, UA RARE (*)    Squamous Epithelial / LPF 6-30 (*)    All other components within normal limits  LIPASE, BLOOD  CBC   ____________________________________________  FINAL ASSESSMENT AND PLAN  Abdominal pain, diarrhea  Plan: Patient with labs and imaging as dictated above. Labs are reassuring, this is likely viral and she'll be discharged with antidiarrheal agents and GI referral.   Earleen Newport, MD   Earleen Newport, MD 08/28/15 [phone]

## 2015-10-19 ENCOUNTER — Emergency Department: Payer: BLUE CROSS/BLUE SHIELD

## 2015-10-19 ENCOUNTER — Emergency Department
Admission: EM | Admit: 2015-10-19 | Discharge: 2015-10-19 | Disposition: A | Discharge status: Home / Self Care | Payer: BLUE CROSS/BLUE SHIELD | Attending: Emergency Medicine | Admitting: Emergency Medicine

## 2015-10-19 ENCOUNTER — Encounter: Payer: Self-pay | Admitting: Emergency Medicine

## 2015-10-19 DIAGNOSIS — I10 Essential (primary) hypertension: Secondary | ICD-10-CM | POA: Diagnosis not present

## 2015-10-19 DIAGNOSIS — Z87891 Personal history of nicotine dependence: Secondary | ICD-10-CM | POA: Insufficient documentation

## 2015-10-19 DIAGNOSIS — Z7982 Long term (current) use of aspirin: Secondary | ICD-10-CM | POA: Diagnosis not present

## 2015-10-19 DIAGNOSIS — Z79899 Other long term (current) drug therapy: Secondary | ICD-10-CM | POA: Insufficient documentation

## 2015-10-19 DIAGNOSIS — J209 Acute bronchitis, unspecified: Secondary | ICD-10-CM

## 2015-10-19 DIAGNOSIS — R0602 Shortness of breath: Secondary | ICD-10-CM | POA: Diagnosis present

## 2015-10-19 DIAGNOSIS — J440 Chronic obstructive pulmonary disease with acute lower respiratory infection: Secondary | ICD-10-CM | POA: Insufficient documentation

## 2015-10-19 LAB — BASIC METABOLIC PANEL
ANION GAP: 8 (ref 5–15)
BUN: 17 mg/dL (ref 6–20)
CHLORIDE: 106 mmol/L (ref 101–111)
CO2: 25 mmol/L (ref 22–32)
Calcium: 9.2 mg/dL (ref 8.9–10.3)
Creatinine, Ser: 0.71 mg/dL (ref 0.44–1.00)
GFR calc Af Amer: >60 mL/min (ref >60)
GFR calc non Af Amer: >60 mL/min (ref >60)
GLUCOSE: 95 mg/dL (ref 65–99)
POTASSIUM: 4 mmol/L (ref 3.5–5.1)
Sodium: 139 mmol/L (ref 135–145)

## 2015-10-19 LAB — CBC
HEMATOCRIT: 38.5 % (ref 35.0–47.0)
HEMOGLOBIN: 13.1 g/dL (ref 12.0–16.0)
MCH: 28.5 pg (ref 26.0–34.0)
MCHC: 34 g/dL (ref 32.0–36.0)
MCV: 83.8 fL (ref 80.0–100.0)
Platelets: 252 10*3/uL (ref 150–440)
RBC: 4.59 MIL/uL (ref 3.80–5.20)
RDW: 12.7 % (ref 11.5–14.5)
WBC: 7.5 10*3/uL (ref 3.6–11.0)

## 2015-10-19 LAB — TROPONIN I: Troponin I: <0.03 ng/mL (ref <0.031)

## 2015-10-19 MED ORDER — AZITHROMYCIN 250 MG PO TABS: ORAL_TABLET | ORAL | Status: DC

## 2015-10-19 MED ORDER — GUAIFENESIN 100 MG/5ML PO SOLN: 5.0000 mL | ORAL | Status: DC | PRN

## 2015-10-19 MED ORDER — PREDNISONE 20 MG PO TABS: 40.0000 mg | ORAL_TABLET | Freq: Every day | ORAL | Status: DC

## 2015-10-19 NOTE — Discharge Instructions (Signed)

## 2015-10-19 NOTE — ED Provider Notes (Signed)
[facility] Emergency Department Provider Note  ____________________________________________  Time seen: 5:15 PM  I have reviewed the triage vital signs and the nursing notes.   HISTORY  Chief Complaint Shortness of Breath    HPI Andrea Ford is a 65 y.o. female who complains of shortness of breath and productive cough for the past 3 days. She has a history of asthma and COPD due to long-term smoking. She also works in a [employer] where it is frequently very hot over 100 and very dusty. Cough is worse at night. She's been using her inhalers without relief. No fever. No dizziness syncope. Has some chest tightness over the sternum with this. No exertional symptoms, nonpleuritic.     Past Medical History  Diagnosis Date  . Hypertension   . COPD (chronic obstructive pulmonary disease) ([place])   . Asthma      There are no active problems to display for this patient.    Past Surgical History  Procedure Laterality Date  . Cholecystectomy    . Ankle surgery Right   . Tubal ligation       Current Outpatient Rx  Name  Route  Sig  Dispense  Refill  . albuterol (PROVENTIL HFA;VENTOLIN HFA) 108 (90 BASE) MCG/ACT inhaler   Inhalation   Inhale into the lungs every 6 (six) hours as needed for wheezing or shortness of breath.         [name] aspirin 81 MG chewable tablet   Oral   Chew 81 mg by mouth daily.         [name] azithromycin (ZITHROMAX Z-PAK) 250 MG tablet      Take 2 tablets (500 mg) on  Day 1,  followed by 1 tablet (250 mg) once daily on Days 2 through 5.   6 each   0   . benzonatate (TESSALON PERLES) 100 MG capsule   Oral   Take 2 capsules (200 mg total) by mouth 3 (three) times daily as needed for cough. Patient not taking: Reported on 02/02/2015   40 capsule   0   . Cholecalciferol (VITAMIN D-3 PO)   Oral   Take 1 tablet by mouth daily.         . ciprofloxacin (CIPRO) 500 MG tablet   Oral   Take 1 tablet (500  mg total) by mouth 2 (two) times daily. Patient not taking: Reported on 02/02/2015   14 tablet   0   . dicyclomine (BENTYL) 20 MG tablet   Oral   Take 1 tablet (20 mg total) by mouth 3 (three) times daily as needed for spasms.   20 tablet   0   . dicyclomine (BENTYL) 20 MG tablet   Oral   Take 1 tablet (20 mg total) by mouth 3 (three) times daily as needed for spasms.   30 tablet   0   . diphenoxylate-atropine (LOMOTIL) 2.5-0.025 MG tablet   Oral   Take 1 tablet by mouth 4 (four) times daily as needed for diarrhea or loose stools.   30 tablet   1   . enalapril (VASOTEC) 10 MG tablet   Oral   Take 10 mg by mouth 2 (two) times daily.         [name] guaiFENesin (ROBITUSSIN) 100 MG/5ML SOLN   Oral   Take 5 mLs (100 mg total) by mouth every 4 (four) hours as needed for cough or to loosen phlegm.   120 mL   0   .  ondansetron (ZOFRAN) 4 MG tablet   Oral   Take 1 tablet (4 mg total) by mouth daily as needed for nausea or vomiting.   20 tablet   1   . predniSONE (DELTASONE) 20 MG tablet   Oral   Take 2 tablets (40 mg total) by mouth daily.   8 tablet   0   . ranitidine (ZANTAC) 150 MG capsule   Oral   Take 1 capsule (150 mg total) by mouth 2 (two) times daily.   60 capsule   0   . sucralfate (CARAFATE) 1 G tablet   Oral   Take 1 tablet (1 g total) by mouth 4 (four) times daily.   60 tablet   0   . triamcinolone ointment (KENALOG) 0.5 %   Topical   Apply 1 application topically 2 (two) times daily. Patient not taking: Reported on 02/02/2015   30 g   0      Allergies Other; Codeine; Diphenhydramine; Etodolac; Meperidine; Prednisone; and Venlafaxine   No family history on file.  Social History Social History  Substance Use Topics  . Smoking status: Former Research scientist (life sciences)  . Smokeless tobacco: None  . Alcohol Use: No    Review of Systems  Constitutional:   No fever or chills. No weight changes Eyes:   No blurry vision or double vision.  ENT:   No sore throat.   Cardiovascular:   Chest tightness Respiratory:   Shortness of breath and productive cough Gastrointestinal:   Negative for abdominal pain, vomiting and diarrhea.  No BRBPR or melena. Genitourinary:   Negative for dysuria or difficulty urinating. Musculoskeletal:   Negative for back pain. No joint swelling or pain. Skin:   Negative for rash. Neurological:   Negative for headaches, focal weakness or numbness. Psychiatric:  No anxiety or depression.   Endocrine:  No changes in energy or sleep difficulty.  10-point ROS otherwise negative.  ____________________________________________   PHYSICAL EXAM:  VITAL SIGNS: ED Triage Vitals  Enc Vitals Group     BP 10/19/15 1609 164/77 mmHg     Pulse Rate 10/19/15 1609 92     Resp 10/19/15 1609 20     Temp 10/19/15 1609 98.6 F (37 C)     Temp Source 10/19/15 1609 Oral     SpO2 10/19/15 1609 97 %     Weight 10/19/15 1609 165 lb (74.844 kg)     Height 10/19/15 1609 5\' 3"  (1.6 m)     Head Cir --      Peak Flow --      Pain Score 10/19/15 1617 7     Pain Loc --      Pain Edu? --      Excl. in [place]? --     Vital signs reviewed, nursing assessments reviewed.   Constitutional:   Alert and oriented. Well appearing and in no distress. Eyes:   No scleral icterus. No conjunctival pallor. PERRL. EOMI ENT   Head:   Normocephalic and atraumatic.   Nose:   No congestion/rhinnorhea. No septal hematoma   Mouth/Throat:   MMM, no pharyngeal erythema. No peritonsillar mass.    Neck:   No stridor. No SubQ emphysema. No meningismus. Hematological/Lymphatic/Immunilogical:   No cervical lymphadenopathy. Cardiovascular:   RRR. Symmetric bilateral radial and DP pulses.  No murmurs.  Respiratory:   Normal respiratory effort without tachypnea nor retractions. Breath sounds are clear and equal bilaterally. No wheezes/rales/rhonchi with normal breathing, but with forceful expiration there is provoked expiratory wheezing.  Normal expiratory phase.  . Gastrointestinal:   Soft and nontender. Non distended. There is no CVA tenderness.  No rebound, rigidity, or guarding. Genitourinary:   deferred Musculoskeletal:   Nontender with normal range of motion in all extremities. No joint effusions.  No lower extremity tenderness.  No edema. Neurologic:   Normal speech and language.  CN 2-10 normal. Motor grossly intact. No gross focal neurologic deficits are appreciated.  Skin:    Skin is warm, dry and intact. No rash noted.  No petechiae, purpura, or bullae. Psychiatric:   Mood and affect are normal. ____________________________________________    LABS (pertinent positives/negatives) (all labs ordered are listed, but only abnormal results are displayed) Labs Reviewed  BASIC METABOLIC PANEL  TROPONIN I  CBC   ____________________________________________   EKG  Interpreted by me Normal sinus rhythm rate of 85, normal axis and intervals. Normal QRS ST segments and T waves.  ____________________________________________    RADIOLOGY  Chest x-ray unremarkable  ____________________________________________   PROCEDURES   ____________________________________________   INITIAL IMPRESSION / ASSESSMENT AND PLAN / ED COURSE  Pertinent labs & imaging results that were available during my care of the patient were reviewed by me and considered in my medical decision making (see chart for details).  Patient well appearing no acute distress. Exam consistent with acute bronchitis. No evidence of pneumonia and pneumothorax or other issues. Considering the patient's symptoms, medical history, and physical examination today, I have low suspicion for ACS, PE, TAD, pneumothorax, carditis, mediastinitis, pneumonia, CHF, or sepsis.  Labs are unremarkable, vital signs unremarkable. We'll discharge home with guaifenesin, prednisone, and she should take azithromycin if her symptoms do not improve in 2 days. Computer says she has an allergy to  prednisone which causes rash, but she reports taking it before without difficulty.     ____________________________________________   FINAL CLINICAL IMPRESSION(S) / ED DIAGNOSES  Final diagnoses:  Acute bronchitis, unspecified organism      Carrie Mew, MD 10/19/15 1740

## 2015-10-19 NOTE — ED Notes (Signed)
Pt reports shortness of breath x3 days; reports hx of asthma and COPD and bronchitis. Pt reports pressure to chest, reports sinus drainage down back of throat. Pt reports today was difficult to breathe in dusty area at work, reports cough is work at night. Pt with no respiratory distress in triage room, ambulatory to room with no difficulty.

## 2015-10-19 NOTE — ED Notes (Signed)
Pt alert and oriented X4, active, cooperative, pt in NAD. RR even and unlabored, color WNL.  Pt informed to return if any life threatening symptoms occur.   

## 2015-11-02 DIAGNOSIS — J449 Chronic obstructive pulmonary disease, unspecified: Secondary | ICD-10-CM | POA: Insufficient documentation

## 2015-12-06 ENCOUNTER — Emergency Department
Admission: EM | Admit: 2015-12-06 | Discharge: 2015-12-06 | Disposition: A | Discharge status: Home / Self Care | Payer: BLUE CROSS/BLUE SHIELD | Attending: Emergency Medicine | Admitting: Emergency Medicine

## 2015-12-06 ENCOUNTER — Encounter: Payer: Self-pay | Admitting: *Deleted

## 2015-12-06 DIAGNOSIS — I10 Essential (primary) hypertension: Secondary | ICD-10-CM | POA: Diagnosis not present

## 2015-12-06 DIAGNOSIS — J449 Chronic obstructive pulmonary disease, unspecified: Secondary | ICD-10-CM | POA: Diagnosis not present

## 2015-12-06 DIAGNOSIS — Z87891 Personal history of nicotine dependence: Secondary | ICD-10-CM | POA: Insufficient documentation

## 2015-12-06 DIAGNOSIS — Z79899 Other long term (current) drug therapy: Secondary | ICD-10-CM | POA: Insufficient documentation

## 2015-12-06 DIAGNOSIS — R519 Headache, unspecified: Secondary | ICD-10-CM

## 2015-12-06 DIAGNOSIS — R51 Headache: Secondary | ICD-10-CM | POA: Diagnosis not present

## 2015-12-06 DIAGNOSIS — Z7982 Long term (current) use of aspirin: Secondary | ICD-10-CM | POA: Insufficient documentation

## 2015-12-06 MED ORDER — KETOROLAC TROMETHAMINE 10 MG PO TABS: 10.0000 mg | ORAL_TABLET | Freq: Four times a day (QID) | ORAL | Status: DC | PRN

## 2015-12-06 MED ORDER — KETOROLAC TROMETHAMINE 60 MG/2ML IM SOLN
30.0000 mg | Freq: Once | INTRAMUSCULAR | Status: AC
  Administered 2015-12-06: 30 mg via INTRAMUSCULAR
  Filled 2015-12-06: qty 2

## 2015-12-06 NOTE — ED Provider Notes (Signed)
 -----------------------------------------   11:26 AM on 12/06/2015 -----------------------------------------  EKG interpreted by me  Date: 12/06/2015  Rate: 82  Rhythm: normal sinus rhythm  QRS Axis: normal  Intervals: normal  ST/T Wave abnormalities: normal  Conduction Disutrbances: none  Narrative Interpretation: unremarkable      Carrie Mew, MD 12/06/15 1126

## 2015-12-06 NOTE — ED Notes (Addendum)
Pt complains of hypertension with a headache, pt was seen at [facility] ER yesterday for the same complaint, pt was given medication for migraines, B?P in triage 105/52, pt has a history of migraines

## 2015-12-06 NOTE — ED Provider Notes (Signed)
[facility] Emergency Department Provider Note ____________________________________________  Time seen: Approximately 11:48 AM  I have reviewed the triage vital signs and the nursing notes.   HISTORY  Chief Complaint Hypertension   HPI Andrea Ford is a 65 y.o. female who presents to the emergency department for evaluation of hypertension. She states she has had hypertension for "years" but as of late, she has had numerous high readings. She believes her headaches are related to the increased BPs, but states that she has also had migraine headaches. She states her blood pressures are higher while at work. She states she works in a hot environment and believes that makes her pressure higher. She is taking enalapril twice per day as prescribed. She denies blurred vision, nausea, or chest pain.   Past Medical History  Diagnosis Date  . Hypertension   . COPD (chronic obstructive pulmonary disease) ([place])   . Asthma     There are no active problems to display for this patient.   Past Surgical History  Procedure Laterality Date  . Cholecystectomy    . Ankle surgery Right   . Tubal ligation      Current Outpatient Rx  Name  Route  Sig  Dispense  Refill  . albuterol (PROVENTIL HFA;VENTOLIN HFA) 108 (90 BASE) MCG/ACT inhaler   Inhalation   Inhale into the lungs every 6 (six) hours as needed for wheezing or shortness of breath.         [name] aspirin 81 MG chewable tablet   Oral   Chew 81 mg by mouth daily.         [name] azithromycin (ZITHROMAX Z-PAK) 250 MG tablet      Take 2 tablets (500 mg) on  Day 1,  followed by 1 tablet (250 mg) once daily on Days 2 through 5.   6 each   0   . benzonatate (TESSALON PERLES) 100 MG capsule   Oral   Take 2 capsules (200 mg total) by mouth 3 (three) times daily as needed for cough. Patient not taking: Reported on 02/02/2015   40 capsule   0   . Cholecalciferol (VITAMIN D-3 PO)   Oral   Take 1 tablet by mouth  daily.         . ciprofloxacin (CIPRO) 500 MG tablet   Oral   Take 1 tablet (500 mg total) by mouth 2 (two) times daily. Patient not taking: Reported on 02/02/2015   14 tablet   0   . dicyclomine (BENTYL) 20 MG tablet   Oral   Take 1 tablet (20 mg total) by mouth 3 (three) times daily as needed for spasms.   20 tablet   0   . dicyclomine (BENTYL) 20 MG tablet   Oral   Take 1 tablet (20 mg total) by mouth 3 (three) times daily as needed for spasms.   30 tablet   0   . diphenoxylate-atropine (LOMOTIL) 2.5-0.025 MG tablet   Oral   Take 1 tablet by mouth 4 (four) times daily as needed for diarrhea or loose stools.   30 tablet   1   . enalapril (VASOTEC) 10 MG tablet   Oral   Take 10 mg by mouth 2 (two) times daily.         [name] guaiFENesin (ROBITUSSIN) 100 MG/5ML SOLN   Oral   Take 5 mLs (100 mg total) by mouth every 4 (four) hours as needed for cough or to loosen phlegm.   120 mL  0   . ketorolac (TORADOL) 10 MG tablet   Oral   Take 1 tablet (10 mg total) by mouth every 6 (six) hours as needed.   20 tablet   0   . ondansetron (ZOFRAN) 4 MG tablet   Oral   Take 1 tablet (4 mg total) by mouth daily as needed for nausea or vomiting.   20 tablet   1   . predniSONE (DELTASONE) 20 MG tablet   Oral   Take 2 tablets (40 mg total) by mouth daily.   8 tablet   0   . ranitidine (ZANTAC) 150 MG capsule   Oral   Take 1 capsule (150 mg total) by mouth 2 (two) times daily.   60 capsule   0   . sucralfate (CARAFATE) 1 G tablet   Oral   Take 1 tablet (1 g total) by mouth 4 (four) times daily.   60 tablet   0   . triamcinolone ointment (KENALOG) 0.5 %   Topical   Apply 1 application topically 2 (two) times daily. Patient not taking: Reported on 02/02/2015   30 g   0     Allergies Other; Codeine; Diphenhydramine; Etodolac; Meperidine; Prednisone; and Venlafaxine  No family history on file.  Social History Social History  Substance Use Topics  . Smoking  status: Former Research scientist (life sciences)  . Smokeless tobacco: None  . Alcohol Use: No    Review of Systems Constitutional: No fever/chills Eyes: No visual changes. ENT: No sore throat. Cardiovascular: Denies chest pain. Respiratory: Denies shortness of breath. Gastrointestinal: No abdominal pain.  No nausea, no vomiting.  No diarrhea.  No constipation. Musculoskeletal: Negative for back pain. Skin: Negative for rash. Neurological: Positive for headaches, negative for focal weakness or numbness.   ____________________________________________   PHYSICAL EXAM:  VITAL SIGNS: ED Triage Vitals  Enc Vitals Group     BP 12/06/15 1117 105/52 mmHg     Pulse Rate 12/06/15 1117 87     Resp 12/06/15 1117 20     Temp 12/06/15 1117 98.5 F (36.9 C)     Temp Source 12/06/15 1117 Oral     SpO2 12/06/15 1117 96 %     Weight 12/06/15 1117 168 lb (76.204 kg)     Height 12/06/15 1117 5\' 3"  (1.6 m)     Head Cir --      Peak Flow --      Pain Score 12/06/15 1118 9     Pain Loc --      Pain Edu? --      Excl. in [place]? --     Constitutional: Alert and oriented. Well appearing and in no acute distress. Eyes: Conjunctivae are normal. PERRL. EOMI. Head: Atraumatic. Nose: No congestion/rhinnorhea. Mouth/Throat: Mucous membranes are moist.  Oropharynx non-erythematous. Neck: No stridor.   Cardiovascular: Normal rate, regular rhythm. Grossly normal heart sounds.  Good peripheral circulation. Respiratory: Normal respiratory effort.  No retractions. Lungs CTAB. Gastrointestinal: Soft and nontender. No distention. No abdominal bruits. Musculoskeletal: No lower extremity tenderness nor edema.  No joint effusions. Neurologic:  Normal speech and language. No gross focal neurologic deficits are appreciated. No gait instability. Skin:  Skin is warm, dry and intact. No rash noted. Psychiatric: Mood and affect are normal. Speech and behavior are normal.  ____________________________________________   LABS (all labs  ordered are listed, but only abnormal results are displayed)  Labs Reviewed - No data to display ____________________________________________  EKG   ____________________________________________  RADIOLOGY  Not indicated.  ____________________________________________   PROCEDURES  Procedure(s) performed: None  Critical Care performed: No  ____________________________________________   INITIAL IMPRESSION / ASSESSMENT AND PLAN / ED COURSE  Pertinent labs & imaging results that were available during my care of the patient were reviewed by me and considered in my medical decision making (see chart for details).  Patient's headache mostly relieved with toradol injection. She will receive a prescription for the same. She was advised to follow up with her PCP to discuss further BP management. She ws instructed to return to the ER for symptoms that change or worsen if unable to schedule an appointment. ____________________________________________   FINAL CLINICAL IMPRESSION(S) / ED DIAGNOSES  Final diagnoses:  Acute nonintractable headache, unspecified headache type                                      Victorino Dike, FNP 12/06/15 1556  Nena Polio, MD 12/07/15 2159

## 2015-12-06 NOTE — Discharge Instructions (Signed)
Please follow up with your primary care provider as soon as possible. Based on your BP readings here, you will not be started on additional medications at this time for BP control. Please take the toradol for headache as needed and as prescribed.  Return to the ER for symptoms that change or worsen if unable to schedule an appointment with your PCP.   General Headache Without Cause A headache is pain or discomfort felt around the head or neck area. There are many causes and types of headaches. In some cases, the cause may not be found.  HOME CARE  Managing Pain  Take over-the-counter and prescription medicines only as told by your doctor.  Lie down in a dark, quiet room when you have a headache.  If directed, apply ice to the head and neck area:  Put ice in a plastic bag.  Place a towel between your skin and the bag.  Leave the ice on for 20 minutes, 2-3 times per day.  Use a heating pad or hot shower to apply heat to the head and neck area as told by your doctor.  Keep lights dim if bright lights bother you or make your headaches worse. Eating and Drinking  Eat meals on a regular schedule.  Lessen how much alcohol you drink.  Lessen how much caffeine you drink, or stop drinking caffeine. General Instructions  Keep all follow-up visits as told by your doctor. This is important.  Keep a journal to find out if certain things bring on headaches. For example, write down:  What you eat and drink.  How much sleep you get.  Any change to your diet or medicines.  Relax by getting a massage or doing other relaxing activities.  Lessen stress.  Sit up straight. Do not tighten (tense) your muscles.  Do not use tobacco products. This includes cigarettes, chewing tobacco, or e-cigarettes. If you need help quitting, ask your doctor.  Exercise regularly as told by your doctor.  Get enough sleep. This often means 7-9 hours of sleep. GET HELP IF:  Your symptoms are not helped by  medicine.  You have a headache that feels different than the other headaches.  You feel sick to your stomach (nauseous) or you throw up (vomit).  You have a fever. GET HELP RIGHT AWAY IF:   Your headache becomes really bad.  You keep throwing up.  You have a stiff neck.  You have trouble seeing.  You have trouble speaking.  You have pain in the eye or ear.  Your muscles are weak or you lose muscle control.  You lose your balance or have trouble walking.  You feel like you will pass out (faint) or you pass out.  You have confusion.   This information is not intended to replace advice given to you by your health care provider. Make sure you discuss any questions you have with your health care provider.   Document Released: 05/07/2008 Document Revised: 04/19/2015 Document Reviewed: 11/21/2014 Elsevier Interactive Patient Education [employer].

## 2015-12-06 NOTE — ED Notes (Signed)
Pt to ed with c/o htn.  Pt states she was seen at [facility] on Monday at [facility] and bp was 188/110.  States eventually it came down a little and they d/c her home.  Pt then states yesterday she had headache and had elevated blood pressure and went to be seen at [facility] ER.  Was given fiorcet for headache and tylenol.  Pt states she has not had a headache in many years but felt it was related to blood pressure being elevated.  Pt states today while at work she began to have headache, flushed face and she felt hot, had her nurse at work to check her bp and it was 160/100.

## 2016-03-22 DIAGNOSIS — G43909 Migraine, unspecified, not intractable, without status migrainosus: Secondary | ICD-10-CM | POA: Insufficient documentation

## 2016-04-17 ENCOUNTER — Emergency Department
Admission: EM | Admit: 2016-04-17 | Discharge: 2016-04-17 | Disposition: A | Discharge status: Home / Self Care | Payer: BLUE CROSS/BLUE SHIELD | Attending: Emergency Medicine | Admitting: Emergency Medicine

## 2016-04-17 DIAGNOSIS — Z87891 Personal history of nicotine dependence: Secondary | ICD-10-CM | POA: Insufficient documentation

## 2016-04-17 DIAGNOSIS — R197 Diarrhea, unspecified: Secondary | ICD-10-CM | POA: Diagnosis not present

## 2016-04-17 DIAGNOSIS — Z7982 Long term (current) use of aspirin: Secondary | ICD-10-CM | POA: Diagnosis not present

## 2016-04-17 DIAGNOSIS — I10 Essential (primary) hypertension: Secondary | ICD-10-CM | POA: Diagnosis not present

## 2016-04-17 DIAGNOSIS — Z79899 Other long term (current) drug therapy: Secondary | ICD-10-CM | POA: Insufficient documentation

## 2016-04-17 DIAGNOSIS — J449 Chronic obstructive pulmonary disease, unspecified: Secondary | ICD-10-CM | POA: Insufficient documentation

## 2016-04-17 DIAGNOSIS — J029 Acute pharyngitis, unspecified: Secondary | ICD-10-CM | POA: Diagnosis not present

## 2016-04-17 DIAGNOSIS — J45909 Unspecified asthma, uncomplicated: Secondary | ICD-10-CM | POA: Diagnosis not present

## 2016-04-17 DIAGNOSIS — Z7952 Long term (current) use of systemic steroids: Secondary | ICD-10-CM | POA: Diagnosis not present

## 2016-04-17 DIAGNOSIS — Z792 Long term (current) use of antibiotics: Secondary | ICD-10-CM | POA: Diagnosis not present

## 2016-04-17 DIAGNOSIS — Z791 Long term (current) use of non-steroidal anti-inflammatories (NSAID): Secondary | ICD-10-CM | POA: Insufficient documentation

## 2016-04-17 HISTORY — DX: Migraine, unspecified, not intractable, without status migrainosus: G43.909

## 2016-04-17 LAB — URINALYSIS COMPLETE WITH MICROSCOPIC (ARMC ONLY)
Bilirubin Urine: NEGATIVE
Glucose, UA: NEGATIVE mg/dL
KETONES UR: NEGATIVE mg/dL
NITRITE: NEGATIVE
PROTEIN: NEGATIVE mg/dL
SPECIFIC GRAVITY, URINE: 1.016 (ref 1.005–1.030)
pH: 5 (ref 5.0–8.0)

## 2016-04-17 LAB — COMPREHENSIVE METABOLIC PANEL
ALK PHOS: 70 U/L (ref 38–126)
ALT: 25 U/L (ref 14–54)
ANION GAP: 7 (ref 5–15)
AST: 27 U/L (ref 15–41)
Albumin: 4.2 g/dL (ref 3.5–5.0)
BILIRUBIN TOTAL: 0.8 mg/dL (ref 0.3–1.2)
BUN: 12 mg/dL (ref 6–20)
CALCIUM: 9.3 mg/dL (ref 8.9–10.3)
CO2: 27 mmol/L (ref 22–32)
Chloride: 103 mmol/L (ref 101–111)
Creatinine, Ser: 0.77 mg/dL (ref 0.44–1.00)
Glucose, Bld: 119 mg/dL — ABNORMAL HIGH (ref 65–99)
Potassium: 4.1 mmol/L (ref 3.5–5.1)
Sodium: 137 mmol/L (ref 135–145)
TOTAL PROTEIN: 7.5 g/dL (ref 6.5–8.1)

## 2016-04-17 LAB — CBC
HCT: 41.2 % (ref 35.0–47.0)
HEMOGLOBIN: 14.5 g/dL (ref 12.0–16.0)
MCH: 29.9 pg (ref 26.0–34.0)
MCHC: 35.2 g/dL (ref 32.0–36.0)
MCV: 84.8 fL (ref 80.0–100.0)
Platelets: 235 10*3/uL (ref 150–440)
RBC: 4.86 MIL/uL (ref 3.80–5.20)
RDW: 12.6 % (ref 11.5–14.5)
WBC: 9.3 10*3/uL (ref 3.6–11.0)

## 2016-04-17 LAB — POCT RAPID STREP A: Streptococcus, Group A Screen (Direct): NEGATIVE

## 2016-04-17 LAB — LIPASE, BLOOD: Lipase: 32 U/L (ref 11–51)

## 2016-04-17 MED ORDER — ONDANSETRON 4 MG PO TBDP: 4.0000 mg | ORAL_TABLET | Freq: Three times a day (TID) | ORAL | 0 refills | Status: DC | PRN

## 2016-04-17 MED ORDER — DICYCLOMINE HCL 20 MG PO TABS: 20.0000 mg | ORAL_TABLET | Freq: Three times a day (TID) | ORAL | 0 refills | Status: DC | PRN

## 2016-04-17 MED ORDER — PENICILLIN G BENZATHINE 1200000 UNIT/2ML IM SUSP
1.2000 10*6.[IU] | Freq: Once | INTRAMUSCULAR | Status: AC
  Administered 2016-04-17: 1.2 10*6.[IU] via INTRAMUSCULAR
  Filled 2016-04-17: qty 2

## 2016-04-17 MED ORDER — PENICILLIN G BENZATHINE & PROC 1200000 UNIT/2ML IM SUSP: 1.2000 10*6.[IU] | Freq: Once | INTRAMUSCULAR | Status: DC

## 2016-04-17 NOTE — ED Triage Notes (Signed)
Pt c/o lower abd pain with lower back pain with sore throat and diarrhea since yesterday.[name]

## 2016-04-17 NOTE — ED Provider Notes (Signed)
[facility] Emergency Department Provider Note  ____________________________________________  Time seen: Approximately 5:03 PM  I have reviewed the triage vital signs and the nursing notes.   HISTORY  Chief Complaint Abdominal Pain; Diarrhea; and Sore Throat    HPI Andrea Ford is a 65 y.o. female complains of sore throat since yesterday. Also a few loose bowel movements without abdominal pain. No chest pain or shortness of breath. She's been around her and daughter over the last 3 or 4 days who was diagnosed yesterday with strep throat. She is around a few elderly roommates and she is concerned about getting sick. No coughing or sneezing. Hurts to swallow. No vomiting.     Past Medical History:  Diagnosis Date  . Asthma   . COPD (chronic obstructive pulmonary disease) ([place])   . Hypertension   . Migraine      There are no active problems to display for this patient.    Past Surgical History:  Procedure Laterality Date  . ANKLE SURGERY Right   . CHOLECYSTECTOMY    . TUBAL LIGATION       Prior to Admission medications   Medication Sig Start Date End Date Taking? Authorizing Provider  albuterol (PROVENTIL HFA;VENTOLIN HFA) 108 (90 BASE) MCG/ACT inhaler Inhale into the lungs every 6 (six) hours as needed for wheezing or shortness of breath.    Historical Provider, MD  aspirin 81 MG chewable tablet Chew 81 mg by mouth daily.    Historical Provider, MD  azithromycin (ZITHROMAX Z-PAK) 250 MG tablet Take 2 tablets (500 mg) on  Day 1,  followed by 1 tablet (250 mg) once daily on Days 2 through 5. 10/19/15   Carrie Mew, MD  Cholecalciferol (VITAMIN D-3 PO) Take 1 tablet by mouth daily.    Historical Provider, MD  dicyclomine (BENTYL) 20 MG tablet Take 1 tablet (20 mg total) by mouth 3 (three) times daily as needed for spasms. 04/17/16   Carrie Mew, MD  diphenoxylate-atropine (LOMOTIL) 2.5-0.025 MG tablet Take 1 tablet by mouth 4 (four) times  daily as needed for diarrhea or loose stools. 08/28/15 08/27/16  Earleen Newport, MD  enalapril (VASOTEC) 10 MG tablet Take 10 mg by mouth 2 (two) times daily.    Historical Provider, MD  guaiFENesin (ROBITUSSIN) 100 MG/5ML SOLN Take 5 mLs (100 mg total) by mouth every 4 (four) hours as needed for cough or to loosen phlegm. 10/19/15   Carrie Mew, MD  ketorolac (TORADOL) 10 MG tablet Take 1 tablet (10 mg total) by mouth every 6 (six) hours as needed. 12/06/15   Victorino Dike, FNP  ondansetron (ZOFRAN ODT) 4 MG disintegrating tablet Take 1 tablet (4 mg total) by mouth every 8 (eight) hours as needed for nausea or vomiting. 04/17/16   Carrie Mew, MD  ondansetron (ZOFRAN) 4 MG tablet Take 1 tablet (4 mg total) by mouth daily as needed for nausea or vomiting. 03/07/15   Earleen Newport, MD  predniSONE (DELTASONE) 20 MG tablet Take 2 tablets (40 mg total) by mouth daily. 10/19/15   Carrie Mew, MD  ranitidine (ZANTAC) 150 MG capsule Take 1 capsule (150 mg total) by mouth 2 (two) times daily. 03/08/15   Nance Pear, MD  sucralfate (CARAFATE) 1 G tablet Take 1 tablet (1 g total) by mouth 4 (four) times daily. 03/08/15 03/07/16  Nance Pear, MD  triamcinolone ointment (KENALOG) 0.5 % Apply 1 application topically 2 (two) times daily. Patient not taking: Reported on 02/02/2015 01/12/15  Harvest Dark, PA-C     Allergies Other; Codeine; Diphenhydramine; Etodolac; Meperidine; Prednisone; and Venlafaxine   No family history on file.  Social History Social History  Substance Use Topics  . Smoking status: Former Research scientist (life sciences)  . Smokeless tobacco: Never Used  . Alcohol use No    Review of Systems  Constitutional:   No fever or chills.  ENT:   Positive sore throat. No rhinorrhea or congestion. Cardiovascular:   No chest pain. Respiratory:   No dyspnea or cough. Gastrointestinal:   Negative for abdominal pain, vomiting positive diarrhea.  Genitourinary:   Negative for dysuria or  difficulty urinating. Musculoskeletal:   Negative for focal pain or swelling Neurological:   Negative for headaches 10-point ROS otherwise negative.  ____________________________________________   PHYSICAL EXAM:  VITAL SIGNS: ED Triage Vitals  Enc Vitals Group     BP 04/17/16 1429 (!) 149/110     Pulse Rate 04/17/16 1429 (!) 110     Resp 04/17/16 1429 17     Temp 04/17/16 1429 97.9 F (36.6 C)     Temp Source 04/17/16 1429 Oral     SpO2 04/17/16 1429 99 %     Weight 04/17/16 1429 167 lb (75.8 kg)     Height 04/17/16 1429 5\' 3"  (1.6 m)     Head Circumference --      Peak Flow --      Pain Score 04/17/16 1430 8     Pain Loc --      Pain Edu? --      Excl. in [place]? --     Vital signs reviewed, nursing assessments reviewed.   Constitutional:   Alert and oriented. Well appearing and in no distress. Eyes:   No scleral icterus. No conjunctival pallor. PERRL. EOMI.  No nystagmus. ENT   Head:   Normocephalic and atraumatic.   Nose:   No congestion/rhinnorhea. No septal hematoma   Mouth/Throat:   MMM, Moderate pharyngeal erythema with slight exudate. No peritonsillar mass.    Neck:   No stridor. No SubQ emphysema. No meningismus. Hematological/Lymphatic/Immunilogical:   Positive right cervical lymphadenopathy. Cardiovascular:   RRR heart rate 80. Symmetric bilateral radial and DP pulses.  No murmurs.  Respiratory:   Normal respiratory effort without tachypnea nor retractions. Breath sounds are clear and equal bilaterally. No wheezes/rales/rhonchi. Gastrointestinal:   Soft and nontender. Non distended. There is no CVA tenderness.  No rebound, rigidity, or guarding. Genitourinary:   deferred Musculoskeletal:   Nontender with normal range of motion in all extremities. No joint effusions.  No lower extremity tenderness.  No edema. Neurologic:   Normal speech and language.  CN 2-10 normal. Motor grossly intact. No gross focal neurologic deficits are appreciated.  Skin:     Skin is warm, dry and intact. No rash noted.  No petechiae, purpura, or bullae.  ____________________________________________    LABS (pertinent positives/negatives) (all labs ordered are listed, but only abnormal results are displayed) Labs Reviewed  COMPREHENSIVE METABOLIC PANEL - Abnormal; Notable for the following:       Result Value   Glucose, Bld 119 (*)    All other components within normal limits  URINALYSIS COMPLETEWITH MICROSCOPIC ([facility] ONLY) - Abnormal; Notable for the following:    Color, Urine YELLOW (*)    APPearance CLEAR (*)    Hgb urine dipstick 1+ (*)    Leukocytes, UA TRACE (*)    Bacteria, UA RARE (*)    Squamous Epithelial / LPF 0-5 (*)  All other components within normal limits  LIPASE, BLOOD  CBC  POCT RAPID STREP A   ____________________________________________   EKG    ____________________________________________    RADIOLOGY    ____________________________________________   PROCEDURES Procedures  ____________________________________________   INITIAL IMPRESSION / ASSESSMENT AND PLAN / ED COURSE  Pertinent labs & imaging results that were available during my care of the patient were reviewed by me and considered in my medical decision making (see chart for details).  Patient well appearing no acute distress. Labs unremarkable. Strep test negative, but clinically she is at high risk of strep. Counseled on decreasing transmission. Has at risk roommates. We'll treat with IM penicillin, patient agrees to this over course of oral antibiotics. Low suspicion for PTA RPA odontogenic infection or facial cellulitis. No evidence of Ludwig angina. No evidence of thrush.     Clinical Course   ____________________________________________   FINAL CLINICAL IMPRESSION(S) / ED DIAGNOSES  Final diagnoses:  Pharyngitis  Diarrhea, unspecified type       Portions of this note were generated with dragon dictation software. Dictation errors  may occur despite best attempts at proofreading.    Carrie Mew, MD 04/17/16 [phone]

## 2016-04-17 NOTE — ED Notes (Signed)

## 2016-05-19 ENCOUNTER — Encounter: Payer: Self-pay | Admitting: Emergency Medicine

## 2016-05-19 ENCOUNTER — Emergency Department
Admission: EM | Admit: 2016-05-19 | Discharge: 2016-05-19 | Disposition: A | Discharge status: Home / Self Care | Payer: BLUE CROSS/BLUE SHIELD | Attending: Emergency Medicine | Admitting: Emergency Medicine

## 2016-05-19 DIAGNOSIS — Z792 Long term (current) use of antibiotics: Secondary | ICD-10-CM | POA: Insufficient documentation

## 2016-05-19 DIAGNOSIS — Y929 Unspecified place or not applicable: Secondary | ICD-10-CM | POA: Diagnosis not present

## 2016-05-19 DIAGNOSIS — Z7982 Long term (current) use of aspirin: Secondary | ICD-10-CM | POA: Insufficient documentation

## 2016-05-19 DIAGNOSIS — W228XXA Striking against or struck by other objects, initial encounter: Secondary | ICD-10-CM | POA: Diagnosis not present

## 2016-05-19 DIAGNOSIS — Z79899 Other long term (current) drug therapy: Secondary | ICD-10-CM | POA: Diagnosis not present

## 2016-05-19 DIAGNOSIS — Y939 Activity, unspecified: Secondary | ICD-10-CM | POA: Diagnosis not present

## 2016-05-19 DIAGNOSIS — Z87891 Personal history of nicotine dependence: Secondary | ICD-10-CM | POA: Insufficient documentation

## 2016-05-19 DIAGNOSIS — Z23 Encounter for immunization: Secondary | ICD-10-CM | POA: Diagnosis not present

## 2016-05-19 DIAGNOSIS — I10 Essential (primary) hypertension: Secondary | ICD-10-CM | POA: Diagnosis not present

## 2016-05-19 DIAGNOSIS — S00412A Abrasion of left ear, initial encounter: Secondary | ICD-10-CM | POA: Diagnosis not present

## 2016-05-19 DIAGNOSIS — J449 Chronic obstructive pulmonary disease, unspecified: Secondary | ICD-10-CM | POA: Diagnosis not present

## 2016-05-19 DIAGNOSIS — Y999 Unspecified external cause status: Secondary | ICD-10-CM | POA: Diagnosis not present

## 2016-05-19 DIAGNOSIS — J45909 Unspecified asthma, uncomplicated: Secondary | ICD-10-CM | POA: Diagnosis not present

## 2016-05-19 DIAGNOSIS — S09302A Unspecified injury of left middle and inner ear, initial encounter: Secondary | ICD-10-CM | POA: Diagnosis present

## 2016-05-19 MED ORDER — TETANUS-DIPHTH-ACELL PERTUSSIS 5-2.5-18.5 LF-MCG/0.5 IM SUSP
0.5000 mL | Freq: Once | INTRAMUSCULAR | Status: AC
  Administered 2016-05-19: 0.5 mL via INTRAMUSCULAR
  Filled 2016-05-19: qty 0.5

## 2016-05-19 MED ORDER — CIPROFLOXACIN HCL 0.3 % OP SOLN: 2.0000 [drp] | OPHTHALMIC | 0 refills | Status: DC

## 2016-05-19 NOTE — ED Triage Notes (Signed)
Pt states about 30 minutes prior to arrival patient's car door corner caught patient in her left ear, pt is not sure if it went in her ear canal, but states she did have some bloody drainage similar to that of a scratch. Denies any hearing loss, but c/o pain.

## 2016-05-19 NOTE — ED Provider Notes (Signed)
[facility] Emergency Department Provider Note ____________________________________________  Time seen: Approximately 12:57 PM  I have reviewed the triage vital signs and the nursing notes.   HISTORY  Chief Complaint Ear Injury    HPI Andrea Ford is a 65 y.o. female presents to the emergency department for evaluation of bleeding from her left ear. She states that she accidentally leaned into the corner of the car door and noticed that her ear was bleeding. She denies change in hearing. She is unsure of her tetanus is up-to-date.  Past Medical History:  Diagnosis Date  . Asthma   . COPD (chronic obstructive pulmonary disease) ([place])   . Hypertension   . Migraine     There are no active problems to display for this patient.   Past Surgical History:  Procedure Laterality Date  . ANKLE SURGERY Right   . CHOLECYSTECTOMY    . TUBAL LIGATION      Prior to Admission medications   Medication Sig Start Date End Date Taking? Authorizing Provider  albuterol (PROVENTIL HFA;VENTOLIN HFA) 108 (90 BASE) MCG/ACT inhaler Inhale into the lungs every 6 (six) hours as needed for wheezing or shortness of breath.    Historical Provider, MD  aspirin 81 MG chewable tablet Chew 81 mg by mouth daily.    Historical Provider, MD  azithromycin (ZITHROMAX Z-PAK) 250 MG tablet Take 2 tablets (500 mg) on  Day 1,  followed by 1 tablet (250 mg) once daily on Days 2 through 5. 10/19/15   Carrie Mew, MD  Cholecalciferol (VITAMIN D-3 PO) Take 1 tablet by mouth daily.    Historical Provider, MD  ciprofloxacin (CILOXAN) 0.3 % ophthalmic solution Place 2 drops into the right ear every 4 (four) hours while awake. 05/19/16   Victorino Dike, FNP  dicyclomine (BENTYL) 20 MG tablet Take 1 tablet (20 mg total) by mouth 3 (three) times daily as needed for spasms. 04/17/16   Carrie Mew, MD  diphenoxylate-atropine (LOMOTIL) 2.5-0.025 MG tablet Take 1 tablet by mouth 4 (four) times daily  as needed for diarrhea or loose stools. 08/28/15 08/27/16  Earleen Newport, MD  enalapril (VASOTEC) 10 MG tablet Take 10 mg by mouth 2 (two) times daily.    Historical Provider, MD  guaiFENesin (ROBITUSSIN) 100 MG/5ML SOLN Take 5 mLs (100 mg total) by mouth every 4 (four) hours as needed for cough or to loosen phlegm. 10/19/15   Carrie Mew, MD  ketorolac (TORADOL) 10 MG tablet Take 1 tablet (10 mg total) by mouth every 6 (six) hours as needed. 12/06/15   Victorino Dike, FNP  ondansetron (ZOFRAN ODT) 4 MG disintegrating tablet Take 1 tablet (4 mg total) by mouth every 8 (eight) hours as needed for nausea or vomiting. 04/17/16   Carrie Mew, MD  ondansetron (ZOFRAN) 4 MG tablet Take 1 tablet (4 mg total) by mouth daily as needed for nausea or vomiting. 03/07/15   Earleen Newport, MD  predniSONE (DELTASONE) 20 MG tablet Take 2 tablets (40 mg total) by mouth daily. 10/19/15   Carrie Mew, MD  ranitidine (ZANTAC) 150 MG capsule Take 1 capsule (150 mg total) by mouth 2 (two) times daily. 03/08/15   Nance Pear, MD  sucralfate (CARAFATE) 1 G tablet Take 1 tablet (1 g total) by mouth 4 (four) times daily. 03/08/15 03/07/16  Nance Pear, MD  triamcinolone ointment (KENALOG) 0.5 % Apply 1 application topically 2 (two) times daily. Patient not taking: Reported on 02/02/2015 01/12/15   Wendi Snipes  Lawrence, PA-C    Allergies Other; Codeine; Diphenhydramine; Etodolac; Meperidine; Prednisone; and Venlafaxine  No family history on file.  Social History Social History  Substance Use Topics  . Smoking status: Former Research scientist (life sciences)  . Smokeless tobacco: Never Used  . Alcohol use No    Review of Systems Constitutional: Negative for fever/chills Eyes: No visual changes. ENT: Negative for  earache. Gastrointestinal: No abdominal pain.  No nausea, no vomiting.  No diarrhea.  No constipation. Musculoskeletal: Negative for pain. Skin: Positive for dried blood in the left ear canal Neurological: Negative  for headaches, focal weakness or numbness. ____________________________________________   PHYSICAL EXAM:  VITAL SIGNS: ED Triage Vitals  Enc Vitals Group     BP 05/19/16 1201 (!) 147/96     Pulse Rate 05/19/16 1201 70     Resp 05/19/16 1201 20     Temp 05/19/16 1201 98.3 F (36.8 C)     Temp Source 05/19/16 1201 Oral     SpO2 05/19/16 1201 100 %     Weight 05/19/16 1201 168 lb (76.2 kg)     Height 05/19/16 1201 5\' 3"  (1.6 m)     Head Circumference --      Peak Flow --      Pain Score 05/19/16 1205 7     Pain Loc --      Pain Edu? --      Excl. in [place]? --     Constitutional: Alert and oriented. Well appearing and in no acute distress. Eyes: Conjunctivae are normal. PERRL. EOMI. Ears: Positive pain with movement of left auricle:; External canal has small, superficial abrasion at approximately 7:00 position just inside ;  Right TM normal; Left TM normal   Head: Atraumatic. Nose: No congestion/rhinnorhea. Mouth/Throat: Mucous membranes are moist.  Oropharynx non-erythematous. Hematological/Lymphatic/Immunilogical: No cervical lymphadenopathy. Cardiovascular: Normal capillary refill. Respiratory: Normal respiratory effort.  No retractions.  Neurologic:  Normal speech and language. No gross focal neurologic deficits are appreciated. Speech is normal. No gait instability. Skin:  Skin is warm, dry and intact. No rash noted. ____________________________________________   LABS (all labs ordered are listed, but only abnormal results are displayed)  Labs Reviewed - No data to display ____________________________________________   RADIOLOGY  Not indicated ____________________________________________   PROCEDURES  Procedure(s) performed: None  ____________________________________________   INITIAL IMPRESSION / ASSESSMENT AND PLAN / ED COURSE  Pertinent labs & imaging results that were available during my care of the patient were reviewed by me and considered in my  medical decision making (see chart for details).  Clinical Course    Prescriptions for Ciprofloxacin drops will be given today.  The patient was advised to follow up with primary care for symptoms that are not improving over the next few days. She was advised to return to the emergency department for symptoms that change or worsen if unable to schedule an appointment.  ____________________________________________   FINAL CLINICAL IMPRESSION(S) / ED DIAGNOSES  Final diagnoses:  Ear canal abrasion, left, initial encounter    Note:  This document was prepared using Dragon voice recognition software and may include unintentional dictation errors.    Victorino Dike, FNP 05/19/16 1805    Delman Kitten, MD 05/25/16 [phone]

## 2016-05-19 NOTE — ED Notes (Signed)
NAD noted at time of D/C. Pt denies questions or concerns. Pt ambulatory to the lobby at this time.  

## 2016-07-13 ENCOUNTER — Encounter: Payer: Self-pay | Admitting: Emergency Medicine

## 2016-07-13 ENCOUNTER — Emergency Department: Payer: Self-pay

## 2016-07-13 ENCOUNTER — Observation Stay
Admission: EM | Admit: 2016-07-13 | Discharge: 2016-07-15 | Disposition: A | Discharge status: Home / Self Care | Payer: Self-pay | Attending: Internal Medicine | Admitting: Internal Medicine

## 2016-07-13 DIAGNOSIS — R079 Chest pain, unspecified: Secondary | ICD-10-CM

## 2016-07-13 DIAGNOSIS — Z87891 Personal history of nicotine dependence: Secondary | ICD-10-CM | POA: Insufficient documentation

## 2016-07-13 DIAGNOSIS — I700 Atherosclerosis of aorta: Secondary | ICD-10-CM | POA: Insufficient documentation

## 2016-07-13 DIAGNOSIS — I2510 Atherosclerotic heart disease of native coronary artery without angina pectoris: Secondary | ICD-10-CM | POA: Insufficient documentation

## 2016-07-13 DIAGNOSIS — Z7982 Long term (current) use of aspirin: Secondary | ICD-10-CM | POA: Insufficient documentation

## 2016-07-13 DIAGNOSIS — Z888 Allergy status to other drugs, medicaments and biological substances status: Secondary | ICD-10-CM | POA: Insufficient documentation

## 2016-07-13 DIAGNOSIS — R109 Unspecified abdominal pain: Secondary | ICD-10-CM

## 2016-07-13 DIAGNOSIS — R9431 Abnormal electrocardiogram [ECG] [EKG]: Secondary | ICD-10-CM

## 2016-07-13 DIAGNOSIS — F411 Generalized anxiety disorder: Secondary | ICD-10-CM

## 2016-07-13 DIAGNOSIS — Z91048 Other nonmedicinal substance allergy status: Secondary | ICD-10-CM | POA: Insufficient documentation

## 2016-07-13 DIAGNOSIS — Z885 Allergy status to narcotic agent status: Secondary | ICD-10-CM | POA: Insufficient documentation

## 2016-07-13 DIAGNOSIS — I071 Rheumatic tricuspid insufficiency: Secondary | ICD-10-CM | POA: Insufficient documentation

## 2016-07-13 DIAGNOSIS — R000 Tachycardia, unspecified: Secondary | ICD-10-CM | POA: Insufficient documentation

## 2016-07-13 DIAGNOSIS — R51 Headache: Secondary | ICD-10-CM

## 2016-07-13 DIAGNOSIS — G43909 Migraine, unspecified, not intractable, without status migrainosus: Secondary | ICD-10-CM | POA: Insufficient documentation

## 2016-07-13 DIAGNOSIS — R519 Headache, unspecified: Secondary | ICD-10-CM

## 2016-07-13 DIAGNOSIS — J449 Chronic obstructive pulmonary disease, unspecified: Secondary | ICD-10-CM | POA: Insufficient documentation

## 2016-07-13 DIAGNOSIS — R072 Precordial pain: Secondary | ICD-10-CM

## 2016-07-13 DIAGNOSIS — I10 Essential (primary) hypertension: Secondary | ICD-10-CM

## 2016-07-13 DIAGNOSIS — E785 Hyperlipidemia, unspecified: Secondary | ICD-10-CM | POA: Insufficient documentation

## 2016-07-13 DIAGNOSIS — R55 Syncope and collapse: Secondary | ICD-10-CM | POA: Insufficient documentation

## 2016-07-13 DIAGNOSIS — Z9882 Breast implant status: Secondary | ICD-10-CM | POA: Insufficient documentation

## 2016-07-13 DIAGNOSIS — I249 Acute ischemic heart disease, unspecified: Principal | ICD-10-CM | POA: Insufficient documentation

## 2016-07-13 LAB — CBC
HEMATOCRIT: 41.6 % (ref 35.0–47.0)
HEMOGLOBIN: 14.4 g/dL (ref 12.0–16.0)
MCH: 29.9 pg (ref 26.0–34.0)
MCHC: 34.6 g/dL (ref 32.0–36.0)
MCV: 86.5 fL (ref 80.0–100.0)
Platelets: 266 10*3/uL (ref 150–440)
RBC: 4.82 MIL/uL (ref 3.80–5.20)
RDW: 12.7 % (ref 11.5–14.5)
WBC: 6.3 10*3/uL (ref 3.6–11.0)

## 2016-07-13 LAB — TROPONIN I: Troponin I: <0.03 ng/mL (ref <0.03)

## 2016-07-13 LAB — BASIC METABOLIC PANEL
ANION GAP: 10 (ref 5–15)
BUN: 13 mg/dL (ref 6–20)
CO2: 24 mmol/L (ref 22–32)
Calcium: 8.8 mg/dL — ABNORMAL LOW (ref 8.9–10.3)
Chloride: 105 mmol/L (ref 101–111)
Creatinine, Ser: 0.73 mg/dL (ref 0.44–1.00)
GFR calc Af Amer: >60 mL/min (ref >60)
GFR calc non Af Amer: >60 mL/min (ref >60)
GLUCOSE: 146 mg/dL — AB (ref 65–99)
POTASSIUM: 3.4 mmol/L — AB (ref 3.5–5.1)
Sodium: 139 mmol/L (ref 135–145)

## 2016-07-13 LAB — APTT: aPTT: 29 seconds (ref 24–36)

## 2016-07-13 LAB — PROTIME-INR
INR: 0.94
PROTHROMBIN TIME: 12.6 s (ref 11.4–15.2)

## 2016-07-13 MED ORDER — POTASSIUM CHLORIDE IN NACL 20-0.9 MEQ/L-% IV SOLN
INTRAVENOUS | Status: DC
  Administered 2016-07-13: 100 mL/h via INTRAVENOUS
  Administered 2016-07-14: 09:00:00 via INTRAVENOUS
  Filled 2016-07-13 (×4): qty 1000

## 2016-07-13 MED ORDER — PROCHLORPERAZINE EDISYLATE 5 MG/ML IJ SOLN
10.0000 mg | Freq: Once | INTRAMUSCULAR | Status: AC
  Administered 2016-07-13: 10 mg via INTRAVENOUS
  Filled 2016-07-13: qty 2

## 2016-07-13 MED ORDER — FAMOTIDINE IN NACL 20-0.9 MG/50ML-% IV SOLN
20.0000 mg | Freq: Two times a day (BID) | INTRAVENOUS | Status: DC
  Administered 2016-07-13: 20 mg via INTRAVENOUS
  Filled 2016-07-13 (×3): qty 50

## 2016-07-13 MED ORDER — LORAZEPAM 2 MG/ML IJ SOLN
0.5000 mg | INTRAMUSCULAR | Status: DC | PRN
  Administered 2016-07-14 – 2016-07-15 (×2): 0.5 mg via INTRAVENOUS
  Filled 2016-07-13 (×2): qty 1

## 2016-07-13 MED ORDER — ACETAMINOPHEN 500 MG PO TABS
1000.0000 mg | ORAL_TABLET | Freq: Once | ORAL | Status: AC
  Administered 2016-07-13: 1000 mg via ORAL
  Filled 2016-07-13: qty 2

## 2016-07-13 MED ORDER — IOPAMIDOL (ISOVUE-370) INJECTION 76%
75.0000 mL | Freq: Once | INTRAVENOUS | Status: AC | PRN
  Administered 2016-07-13: 75 mL via INTRAVENOUS

## 2016-07-13 MED ORDER — HEPARIN (PORCINE) IN NACL 100-0.45 UNIT/ML-% IJ SOLN
850.0000 [IU]/h | INTRAMUSCULAR | Status: DC
  Administered 2016-07-13: 850 [IU]/h via INTRAVENOUS
  Filled 2016-07-13: qty 250

## 2016-07-13 MED ORDER — BISACODYL 10 MG RE SUPP: 10.0000 mg | Freq: Every day | RECTAL | Status: DC | PRN

## 2016-07-13 MED ORDER — BUSPIRONE HCL 5 MG PO TABS
7.5000 mg | ORAL_TABLET | Freq: Two times a day (BID) | ORAL | Status: DC
  Administered 2016-07-14 – 2016-07-15 (×2): 7.5 mg via ORAL
  Filled 2016-07-13: qty 1
  Filled 2016-07-13: qty 2
  Filled 2016-07-13: qty 1
  Filled 2016-07-13: qty 2
  Filled 2016-07-13: qty 1

## 2016-07-13 MED ORDER — ALBUTEROL SULFATE (2.5 MG/3ML) 0.083% IN NEBU: 3.0000 mL | INHALATION_SOLUTION | Freq: Four times a day (QID) | RESPIRATORY_TRACT | Status: DC | PRN

## 2016-07-13 MED ORDER — DOCUSATE SODIUM 100 MG PO CAPS
100.0000 mg | ORAL_CAPSULE | Freq: Two times a day (BID) | ORAL | Status: DC
  Administered 2016-07-14 – 2016-07-15 (×2): 100 mg via ORAL
  Filled 2016-07-13 (×3): qty 1

## 2016-07-13 MED ORDER — MORPHINE SULFATE (PF) 4 MG/ML IV SOLN: 2.0000 mg | INTRAVENOUS | Status: DC | PRN

## 2016-07-13 MED ORDER — ASPIRIN 81 MG PO CHEW
324.0000 mg | CHEWABLE_TABLET | Freq: Once | ORAL | Status: AC
  Administered 2016-07-13: 324 mg via ORAL
  Filled 2016-07-13: qty 4

## 2016-07-13 MED ORDER — HYDROXYZINE HCL 25 MG PO TABS: 25.0000 mg | ORAL_TABLET | ORAL | Status: DC | PRN

## 2016-07-13 MED ORDER — ACETAMINOPHEN 650 MG RE SUPP: 650.0000 mg | Freq: Four times a day (QID) | RECTAL | Status: DC | PRN

## 2016-07-13 MED ORDER — SODIUM CHLORIDE 0.9 % IV BOLUS (SEPSIS)
1000.0000 mL | Freq: Once | INTRAVENOUS | Status: AC
  Administered 2016-07-13: 1000 mL via INTRAVENOUS

## 2016-07-13 MED ORDER — ACETAMINOPHEN 325 MG PO TABS
650.0000 mg | ORAL_TABLET | Freq: Four times a day (QID) | ORAL | Status: DC | PRN
  Administered 2016-07-14 (×2): 650 mg via ORAL
  Filled 2016-07-13 (×2): qty 2

## 2016-07-13 MED ORDER — NITROGLYCERIN 0.4 MG SL SUBL: 0.4000 mg | SUBLINGUAL_TABLET | SUBLINGUAL | Status: DC | PRN

## 2016-07-13 MED ORDER — NITROGLYCERIN 2 % TD OINT
1.0000 [in_us] | TOPICAL_OINTMENT | Freq: Once | TRANSDERMAL | Status: AC
  Administered 2016-07-13: 1 [in_us] via TOPICAL
  Filled 2016-07-13: qty 1

## 2016-07-13 MED ORDER — METOPROLOL TARTRATE 25 MG PO TABS
25.0000 mg | ORAL_TABLET | Freq: Two times a day (BID) | ORAL | Status: DC
  Administered 2016-07-14 – 2016-07-15 (×3): 25 mg via ORAL
  Filled 2016-07-13 (×3): qty 1

## 2016-07-13 MED ORDER — HYDRALAZINE HCL 20 MG/ML IJ SOLN: 10.0000 mg | INTRAMUSCULAR | Status: DC | PRN

## 2016-07-13 MED ORDER — DIPHENHYDRAMINE HCL 50 MG/ML IJ SOLN
25.0000 mg | Freq: Once | INTRAMUSCULAR | Status: DC
  Filled 2016-07-13: qty 1

## 2016-07-13 MED ORDER — LOSARTAN POTASSIUM 50 MG PO TABS
50.0000 mg | ORAL_TABLET | Freq: Two times a day (BID) | ORAL | Status: DC
  Administered 2016-07-14 – 2016-07-15 (×2): 50 mg via ORAL
  Filled 2016-07-13 (×3): qty 1

## 2016-07-13 MED ORDER — NITROGLYCERIN 2 % TD OINT
2.0000 [in_us] | TOPICAL_OINTMENT | Freq: Four times a day (QID) | TRANSDERMAL | Status: DC
  Administered 2016-07-14 (×2): 2 [in_us] via TOPICAL
  Filled 2016-07-13 (×2): qty 2

## 2016-07-13 MED ORDER — ONDANSETRON HCL 4 MG PO TABS: 4.0000 mg | ORAL_TABLET | Freq: Four times a day (QID) | ORAL | Status: DC | PRN

## 2016-07-13 MED ORDER — CITALOPRAM HYDROBROMIDE 20 MG PO TABS
40.0000 mg | ORAL_TABLET | Freq: Every day | ORAL | Status: DC
  Administered 2016-07-14 – 2016-07-15 (×2): 40 mg via ORAL
  Filled 2016-07-13 (×2): qty 2

## 2016-07-13 MED ORDER — ASPIRIN 81 MG PO CHEW
81.0000 mg | CHEWABLE_TABLET | Freq: Every day | ORAL | Status: DC
  Administered 2016-07-14 – 2016-07-15 (×2): 81 mg via ORAL
  Filled 2016-07-13 (×2): qty 1

## 2016-07-13 MED ORDER — CIPROFLOXACIN HCL 0.3 % OP SOLN: 2.0000 [drp] | OPHTHALMIC | Status: DC

## 2016-07-13 MED ORDER — ONDANSETRON HCL 4 MG/2ML IJ SOLN: 4.0000 mg | Freq: Four times a day (QID) | INTRAMUSCULAR | Status: DC | PRN

## 2016-07-13 MED ORDER — SODIUM CHLORIDE 0.9% FLUSH
3.0000 mL | Freq: Two times a day (BID) | INTRAVENOUS | Status: DC
  Administered 2016-07-13 – 2016-07-15 (×4): 3 mL via INTRAVENOUS

## 2016-07-13 MED ORDER — HEPARIN BOLUS VIA INFUSION
4000.0000 [IU] | Freq: Once | INTRAVENOUS | Status: AC
  Administered 2016-07-13: 4000 [IU] via INTRAVENOUS
  Filled 2016-07-13: qty 4000

## 2016-07-13 MED ORDER — LORAZEPAM 2 MG/ML IJ SOLN
0.5000 mg | Freq: Once | INTRAMUSCULAR | Status: AC
  Administered 2016-07-13: 0.5 mg via INTRAVENOUS
  Filled 2016-07-13: qty 1

## 2016-07-13 NOTE — ED Notes (Signed)
Patient complaining of increased anxiety. States she feels restless. MD made aware. Will continue to monitor.

## 2016-07-13 NOTE — Care Management (Signed)
Patient is in with Nurse right now, I will look to visit with the patient in the morning.

## 2016-07-13 NOTE — H&P (Signed)
History and Physical    PATRICI Ford [id number] DOB: 06-Jun-1951 DOA: 07/13/2016  Referring physician: Dr. Quentin Cornwall PCP: PROVIDER NOT [place]  Specialists: none  Chief Complaint: Chest pain radiating to left arm  HPI: Andrea Ford is a 65 y.o. female has a past medical history significant for HTN and chronic HA's now with new onset chest pain at 4pm radiating to left arm associated with near syncope. In ER, troponin normal but EKG grossly abnormal. BP elevated. She is now admitted. Denies cardiac hx.  Review of Systems: The patient denies anorexia, fever, weight loss,, vision loss, decreased hearing, hoarseness,  syncope, dyspnea on exertion, peripheral edema, balance deficits, hemoptysis, abdominal pain, melena, hematochezia, severe indigestion/heartburn, hematuria, incontinence, genital sores, muscle weakness, suspicious skin lesions, transient blindness, difficulty walking, depression, unusual weight change, abnormal bleeding, enlarged lymph nodes, angioedema, and breast masses.   Past Medical History:  Diagnosis Date  . Asthma   . COPD (chronic obstructive pulmonary disease) ([place])   . Hypertension   . Migraine    Past Surgical History:  Procedure Laterality Date  . ANKLE SURGERY Right   . CHOLECYSTECTOMY    . TUBAL LIGATION     Social History:  reports that she has quit smoking. She has never used smokeless tobacco. She reports that she does not drink alcohol or use drugs.  Allergies  Allergen Reactions  . Other Other (See Comments)    Pet dander  . Codeine Itching  . Diphenhydramine Anxiety  . Etodolac Rash  . Meperidine Rash  . Prednisone Rash  . Venlafaxine Rash    History reviewed. No pertinent family history.  Prior to Admission medications   Medication Sig Start Date End Date Taking? Authorizing Provider  aspirin 81 MG chewable tablet Chew 81 mg by mouth daily.   Yes Historical Provider, MD  busPIRone (BUSPAR) 7.5 MG tablet Take 7.5 mg by mouth 2  (two) times daily. 05/02/16 05/02/17 Yes Historical Provider, MD  Cholecalciferol (VITAMIN D-3 PO) Take 1 tablet by mouth daily.   Yes Historical Provider, MD  citalopram (CELEXA) 40 MG tablet Take 40 mg by mouth daily. 05/02/16  Yes Historical Provider, MD  losartan (COZAAR) 100 MG tablet Take 100 mg by mouth daily. 02/01/16 01/31/17 Yes Historical Provider, MD  albuterol (PROVENTIL HFA;VENTOLIN HFA) 108 (90 BASE) MCG/ACT inhaler Inhale into the lungs every 6 (six) hours as needed for wheezing or shortness of breath.    Historical Provider, MD  ciprofloxacin (CILOXAN) 0.3 % ophthalmic solution Place 2 drops into the right ear every 4 (four) hours while awake. 05/19/16   Victorino Dike, FNP  hydrOXYzine (ATARAX/VISTARIL) 25 MG tablet Take 25 mg by mouth daily as needed. 05/02/16   Historical Provider, MD  ondansetron (ZOFRAN ODT) 4 MG disintegrating tablet Take 1 tablet (4 mg total) by mouth every 8 (eight) hours as needed for nausea or vomiting. 04/17/16   Carrie Mew, MD   Physical Exam: Vitals:   07/13/16 1431 07/13/16 1600 07/13/16 1630 07/13/16 1844  BP: (!) 160/100 139/87 (!) 145/96 (!) 150/131  Pulse: (!) 110 89 85 80  Resp: 20 (!) 21 (!) 21 20  Temp:      TempSrc:      SpO2: 99% 96% 97% 98%  Weight:      Height:         General:  No apparent distress, WD/WN, [place]/AT  Eyes: PERRL, EOMI, no scleral icterus, conjunctiva clear  ENT: moist oropharynx without exudate, TM's benign, dentition good  Neck: supple, no lymphadenopathy. No bruits or thyromegaly  Cardiovascular: regular rate without MRG; 2+ peripheral pulses, no JVD, no peripheral edema  Respiratory: CTA biL, good air movement without wheezing, rhonchi or crackled. Respiratory effort normal  Abdomen: soft, non tender to palpation, positive bowel sounds, no guarding, no rebound  Skin: no rashes or lesions  Musculoskeletal: normal bulk and tone, no joint swelling  Psychiatric: normal mood and affect, A&OX3  Neurologic:  CN 2-12 grossly intact, Motor strength 5/5 in all 4 groups with symmetric DTR's and non-focal sensory exam  Labs on Admission:  Basic Metabolic Panel:  Recent Labs Lab 07/13/16 1510  NA 139  K 3.4*  CL 105  CO2 24  GLUCOSE 146*  BUN 13  CREATININE 0.73  CALCIUM 8.8*   Liver Function Tests: No results for input(s): AST, ALT, ALKPHOS, BILITOT, PROT, ALBUMIN in the last 168 hours. No results for input(s): LIPASE, AMYLASE in the last 168 hours. No results for input(s): AMMONIA in the last 168 hours. CBC:  Recent Labs Lab 07/13/16 1510  WBC 6.3  HGB 14.4  HCT 41.6  MCV 86.5  PLT 266   Cardiac Enzymes:  Recent Labs Lab 07/13/16 1510  TROPONINI <0.03    BNP (last 3 results) No results for input(s): BNP in the last 8760 hours.  ProBNP (last 3 results) No results for input(s): PROBNP in the last 8760 hours.  CBG: No results for input(s): GLUCAP in the last 168 hours.  Radiological Exams on Admission: Dg Chest 2 View  Result Date: 07/13/2016 CLINICAL DATA:  Chest and left arm pain today.  Ex-smoker. EXAM: CHEST  2 VIEW COMPARISON:  10/19/2015. FINDINGS: Normal sized heart. Clear lungs. Bilateral breast implants with capsular calcifications. Unremarkable bones. IMPRESSION: No acute abnormality. Electronically Signed   By: Claudie Revering M.D.   On: 07/13/2016 15:09   Ct Angio Chest Pe W And/or Wo Contrast  Result Date: 07/13/2016 CLINICAL DATA:  65 year old female with history of increased higher rate in shortness of breath that started today. Left-sided chest pain radiating into the left arm. Former smoker (quit 40 years ago). EXAM: CT ANGIOGRAPHY CHEST WITH CONTRAST TECHNIQUE: Multidetector CT imaging of the chest was performed using the standard protocol during bolus administration of intravenous contrast. Multiplanar CT image reconstructions and MIPs were obtained to evaluate the vascular anatomy. CONTRAST:  75 mL of Isovue 370. COMPARISON:  No priors. FINDINGS:  Cardiovascular: Study is limited by respiratory motion such that accurate assessment for distal subsegmental sized pulmonary emboli is not possible. With this limitation in mind, there is no evidence to suggest clinically relevant central, lobar or segmental sized embolus. Heart size is normal. There is no significant pericardial fluid, thickening or pericardial calcification. There is aortic atherosclerosis, as well as atherosclerosis of the great vessels of the mediastinum and the coronary arteries, including calcified atherosclerotic plaque in the left anterior descending coronary artery. Mediastinum/Nodes: No pathologically enlarged mediastinal or hilar lymph nodes. Esophagus is unremarkable in appearance. No axillary lymphadenopathy. Lungs/Pleura: Small calcified granuloma in the lingula. No other suspicious appearing pulmonary nodules or masses are noted. There are areas of dependent atelectasis and/or scarring throughout the lungs bilaterally, accentuated by low lung volumes. No acute consolidative airspace disease. No pleural effusions. Upper Abdomen: Unremarkable. Musculoskeletal: Bilateral densely calcified subglandular breast implants incidentally noted. There are no aggressive appearing lytic or blastic lesions noted in the visualized portions of the skeleton. Review of the MIP images confirms the above findings. IMPRESSION: 1. Despite the limitations of today's  examination there is no evidence to suggest clinically significant central, lobar or segmental sized pulmonary embolism. Smaller distal subsegmental sized emboli cannot be completely excluded. 2. No acute findings in the thorax to account for the patient's symptoms. 3. Aortic atherosclerosis, in addition to left anterior descending coronary artery disease. Please note that although the presence of coronary artery calcium documents the presence of coronary artery disease, the severity of this disease and any potential stenosis cannot be assessed  on this non-gated CT examination. Assessment for potential risk factor modification, dietary therapy or pharmacologic therapy may be warranted, if clinically indicated. 4. Additional incidental findings, as above. Electronically Signed   By: Vinnie Langton M.D.   On: 07/13/2016 18:15    EKG: Independently reviewed.  Assessment/Plan Principal Problem:   Chest pain Active Problems:   Accelerated hypertension   Abnormal EKG   GAD (generalized anxiety disorder)   Will observe on telemetry with Heparin drip, NTP, lopressor, and prn SL NTG and IV morphine. Follow enzymes and order echo. Cardiology consult. Optimize BP regimen. Repeat routine labs in AM.  Diet: clear liquids Fluids: NS with K+@100  DVT Prophylaxis: Heparin drip  Code Status: FULL  Family Communication: none  Disposition Plan: home  Time spent: 50 min

## 2016-07-13 NOTE — ED Triage Notes (Signed)
Started with left arm pain today and tried to put it off but now started having chest pain with arm pain and "just don't feel well".  Having some nausea.

## 2016-07-13 NOTE — Progress Notes (Signed)
ANTICOAGULATION CONSULT NOTE - Initial Consult  Pharmacy Consult for heparin drip Indication: chest pain/ACS  Allergies  Allergen Reactions  . Other Other (See Comments)    Pet dander  . Codeine Itching  . Diphenhydramine Anxiety  . Etodolac Rash  . Meperidine Rash  . Prednisone Rash  . Venlafaxine Rash    Patient Measurements: Height: 5\' 3"  (160 cm) Weight: 168 lb (76.2 kg) IBW/kg (Calculated) : 52.4 Heparin Dosing Weight: 70   Vital Signs: Temp: 97.7 F (36.5 C) (12/02 1426) Temp Source: Oral (12/02 1426) BP: 145/96 (12/02 1630) Pulse Rate: 85 (12/02 1630)  Labs:  Recent Labs  07/13/16 1510  HGB 14.4  HCT 41.6  PLT 266  APTT 29  LABPROT 12.6  INR 0.94  CREATININE 0.73  TROPONINI <0.03    Estimated Creatinine Clearance: 70.3 mL/min (by C-G formula based on SCr of 0.73 mg/dL).   Medical History: Past Medical History:  Diagnosis Date  . Asthma   . COPD (chronic obstructive pulmonary disease) ([place])   . Hypertension   . Migraine      Assessment: 65 yo female with left arm  and chest pain. Pharmacy consulted for heparin dosing and monitoring.   Goal of Therapy:  Heparin level 0.3-0.7 units/ml Monitor platelets by anticoagulation protocol: Yes   Plan:  Baseline labs ordered. DW: 70kg  Give 4000 units bolus x 1 Start heparin infusion at 850 units/hr Check anti-Xa level in 6 hours and daily while on heparin Continue to monitor H&H and platelets  Pernell Dupre, PharmD, BCPS Clinical Pharmacist 07/13/2016 6:48 PM

## 2016-07-13 NOTE — ED Provider Notes (Signed)
[facility] Emergency Department Provider Note    First MD Initiated Contact with Patient 07/13/16 1458     (approximate)  I have reviewed the triage vital signs and the nursing notes.   HISTORY  Chief Complaint Chest Pain    HPI Andrea Ford is a 65 y.o. female who presents with multiple complaints.  States that she was otherwise doing well today went to restaurant and was having lunch when she began to have nausea after eating chicken Sam which associated with lightheadedness, a right-sided headache as well as left arm numbness and tingling. Patient went to the restroom and had vomited several times. No blood in her vomit. Denies any chest pain or pressure. No shortness of breath. No pain with deep inspiration. Denies any pain radiating to her right shoulder or jaw. States that she has a history of migraines and has similar headache at this time. Eyes any orthopnea. No exertional dyspnea.   Past Medical History:  Diagnosis Date  . Asthma   . COPD (chronic obstructive pulmonary disease) ([place])   . Hypertension   . Migraine    History reviewed. No pertinent family history. Past Surgical History:  Procedure Laterality Date  . ANKLE SURGERY Right   . CHOLECYSTECTOMY    . TUBAL LIGATION     There are no active problems to display for this patient.     Prior to Admission medications   Medication Sig Start Date End Date Taking? Authorizing Provider  albuterol (PROVENTIL HFA;VENTOLIN HFA) 108 (90 BASE) MCG/ACT inhaler Inhale into the lungs every 6 (six) hours as needed for wheezing or shortness of breath.    Historical Provider, MD  aspirin 81 MG chewable tablet Chew 81 mg by mouth daily.    Historical Provider, MD  azithromycin (ZITHROMAX Z-PAK) 250 MG tablet Take 2 tablets (500 mg) on  Day 1,  followed by 1 tablet (250 mg) once daily on Days 2 through 5. 10/19/15   Carrie Mew, MD  Cholecalciferol (VITAMIN D-3 PO) Take 1 tablet by mouth daily.     Historical Provider, MD  ciprofloxacin (CILOXAN) 0.3 % ophthalmic solution Place 2 drops into the right ear every 4 (four) hours while awake. 05/19/16   Victorino Dike, FNP  dicyclomine (BENTYL) 20 MG tablet Take 1 tablet (20 mg total) by mouth 3 (three) times daily as needed for spasms. 04/17/16   Carrie Mew, MD  diphenoxylate-atropine (LOMOTIL) 2.5-0.025 MG tablet Take 1 tablet by mouth 4 (four) times daily as needed for diarrhea or loose stools. 08/28/15 08/27/16  Earleen Newport, MD  enalapril (VASOTEC) 10 MG tablet Take 10 mg by mouth 2 (two) times daily.    Historical Provider, MD  guaiFENesin (ROBITUSSIN) 100 MG/5ML SOLN Take 5 mLs (100 mg total) by mouth every 4 (four) hours as needed for cough or to loosen phlegm. 10/19/15   Carrie Mew, MD  ketorolac (TORADOL) 10 MG tablet Take 1 tablet (10 mg total) by mouth every 6 (six) hours as needed. 12/06/15   Victorino Dike, FNP  ondansetron (ZOFRAN ODT) 4 MG disintegrating tablet Take 1 tablet (4 mg total) by mouth every 8 (eight) hours as needed for nausea or vomiting. 04/17/16   Carrie Mew, MD  ondansetron (ZOFRAN) 4 MG tablet Take 1 tablet (4 mg total) by mouth daily as needed for nausea or vomiting. 03/07/15   Earleen Newport, MD  predniSONE (DELTASONE) 20 MG tablet Take 2 tablets (40 mg total) by mouth daily. 10/19/15  Carrie Mew, MD  ranitidine (ZANTAC) 150 MG capsule Take 1 capsule (150 mg total) by mouth 2 (two) times daily. 03/08/15   Nance Pear, MD  sucralfate (CARAFATE) 1 G tablet Take 1 tablet (1 g total) by mouth 4 (four) times daily. 03/08/15 03/07/16  Nance Pear, MD  triamcinolone ointment (KENALOG) 0.5 % Apply 1 application topically 2 (two) times daily. Patient not taking: Reported on 02/02/2015 01/12/15   Harvest Dark, PA-C    Allergies Other; Codeine; Diphenhydramine; Etodolac; Meperidine; Prednisone; and Venlafaxine    Social History Social History  Substance Use Topics  . Smoking status:  Former Research scientist (life sciences)  . Smokeless tobacco: Never Used  . Alcohol use No    Review of Systems Patient denies headaches, rhinorrhea, blurry vision, numbness, shortness of breath, chest pain, edema, cough, abdominal pain, nausea, vomiting, diarrhea, dysuria, fevers, rashes or hallucinations unless otherwise stated above in HPI. ____________________________________________   PHYSICAL EXAM:  VITAL SIGNS: Vitals:   07/13/16 1426 07/13/16 1431  BP:  (!) 160/100  Pulse:  (!) 110  Resp:  20  Temp: 97.7 F (36.5 C)     Constitutional: Alert and oriented. anxious appearing and in no acute distress. Eyes: Conjunctivae are normal. PERRL. EOMI. Head: Atraumatic. Nose: No congestion/rhinnorhea. Mouth/Throat: Mucous membranes are moist.  Oropharynx non-erythematous. Neck: No stridor. Painless ROM. No cervical spine tenderness to palpation Hematological/Lymphatic/Immunilogical: No cervical lymphadenopathy. Cardiovascular: Normal rate, regular rhythm. Grossly normal heart sounds.  Good peripheral circulation. Respiratory: Normal respiratory effort.  No retractions. Lungs CTAB. Gastrointestinal: Soft and nontender. No distention. No abdominal bruits. No CVA tenderness. Musculoskeletal: No lower extremity tenderness nor edema.  No joint effusions. Neurologic:  Normal speech and language. No gross focal neurologic deficits are appreciated. No gait instability. Skin:  Skin is warm, dry and intact. No rash noted.   ____________________________________________   LABS (all labs ordered are listed, but only abnormal results are displayed)  Results for orders placed or performed during the hospital encounter of 07/13/16 (from the past 24 hour(s))  Basic metabolic panel     Status: Abnormal   Collection Time: 07/13/16  3:10 PM  Result Value Ref Range   Sodium 139 135 - 145 mmol/L   Potassium 3.4 (L) 3.5 - 5.1 mmol/L   Chloride 105 101 - 111 mmol/L   CO2 24 22 - 32 mmol/L   Glucose, Bld 146 (H) 65 - 99  mg/dL   BUN 13 6 - 20 mg/dL   Creatinine, Ser 0.73 0.44 - 1.00 mg/dL   Calcium 8.8 (L) 8.9 - 10.3 mg/dL   GFR calc non Af Amer >60 >60 mL/min   GFR calc Af Amer >60 >60 mL/min   Anion gap 10 5 - 15  CBC     Status: None   Collection Time: 07/13/16  3:10 PM  Result Value Ref Range   WBC 6.3 3.6 - 11.0 K/uL   RBC 4.82 3.80 - 5.20 MIL/uL   Hemoglobin 14.4 12.0 - 16.0 g/dL   HCT 41.6 35.0 - 47.0 %   MCV 86.5 80.0 - 100.0 fL   MCH 29.9 26.0 - 34.0 pg   MCHC 34.6 32.0 - 36.0 g/dL   RDW 12.7 11.5 - 14.5 %   Platelets 266 150 - 440 K/uL  Troponin I     Status: None   Collection Time: 07/13/16  3:10 PM  Result Value Ref Range   Troponin I <0.03 <0.03 ng/mL   ____________________________________________  EKG My review and personal interpretation at Time:  14:28   Indication: chest pain  Rate: 115  Rhythm: sinus Axis: normal Other: No STEMI criteria but there is ST elevation in AVR > V1 with reciprocal depression in V4-V6 and I, Ii, and aVF  My review and personal interpretation at Time: 16:56   Indication: chest pain  Rate: 75  Rhythm: sinus Axis: normal Other: No STEMI criteria, interval improvement in st changes from previous EKG ____________________________________________  RADIOLOGY  I personally reviewed all radiographic images ordered to evaluate for the above acute complaints and reviewed radiology reports and findings.  These findings were personally discussed with the patient.  Please see medical record for radiology report. ____________________________________________   PROCEDURES  Procedure(s) performed: none Procedures    Critical Care performed: yes CRITICAL CARE Performed by: Merlyn Lot   Total critical care time: 35 minutes  Critical care time was exclusive of separately billable procedures and treating other patients.  Critical care was necessary to treat or prevent imminent or life-threatening deterioration.  Critical care was time spent  personally by me on the following activities: development of treatment plan with patient and/or surrogate as well as nursing, discussions with consultants, evaluation of patient's response to treatment, examination of patient, obtaining history from patient or surrogate, ordering and performing treatments and interventions, ordering and review of laboratory studies, ordering and review of radiographic studies, pulse oximetry and re-evaluation of patient's condition.  ____________________________________________   INITIAL IMPRESSION / ASSESSMENT AND PLAN / ED COURSE  Pertinent labs & imaging results that were available during my care of the patient were reviewed by me and considered in my medical decision making (see chart for details).  DDX: acs, pe, pna, dehydration, gastroenteritis, gastritis  KISSY FELLER is a 65 y.o. who presents to the ED with multiple complaints. Patient arrives afebrile mildly tachycardic and hypertensive. No respiratory distress. Very bizarre constellation of symptoms. Difficult to tie the mall to 1 process. Importantly her EKG does show evidence of ischemic changes with elevations in aVR and reciprocal ST depressions. Possible rate dependent. Not consistent with ST elevation MI. Patient currently denying any chest pain at this time but was complaining of aching pain in her left upper extremity. We'll further evaluate for any evidence of ACS. Her abdominal exam is soft and reassuring. We'll provide symptomatically treatment for her headache.  The patient will be placed on continuous pulse oximetry and telemetry for monitoring.  Laboratory evaluation will be sent to evaluate for the above complaints.     Clinical Course as of Jul 14 4  Sat Jul 13, 2016  1641 Patient requesting something for anxiety.  [PR]  [id number] Patient reassessed. Currently chest pain-free. CT chest does not show any evidence of pulmonary embolism but does show significant arterial atherosclerosis  including narrowing of the LAD which would be consistent with the patient's EKG changes. I spoke with Dr. Carren Rang regarding the patient's presentation and he reviewed the EKG. No evidence of STEMI criteria but I do feel the patient is high risk and will require admission to hospital for further evaluation and management. I will start the patient on heparin drip and give aspirin in addition to nitrates.  Have discussed with the patient and available family all diagnostics and treatments performed thus far and all questions were answered to the best of my ability. The patient demonstrates understanding and agreement with plan.   [PR]  [phone] Spoke with Dr. Doy Hutching regarding admission.  HE requested contacting [place] as we do not have cath lab availability on  Sunday.  Will contact [place].  [PR]  [phone] Spoke with [facility] carelink regarding patient's presentation.  Recommending patient continue cardiac eval at [facility] at this time.  Have discussed with the patient and available family all diagnostics and treatments performed thus far and all questions were answered to the best of my ability. The patient demonstrates understanding and agreement with plan.   [PR]    Clinical Course User Index [PR] Merlyn Lot, MD     ____________________________________________   FINAL CLINICAL IMPRESSION(S) / ED DIAGNOSES  Final diagnoses:  Chest pain, unspecified type  Abdominal pain, unspecified abdominal location  Nonintractable headache, unspecified chronicity pattern, unspecified headache type      NEW MEDICATIONS STARTED DURING THIS VISIT:  New Prescriptions   No medications on file     Note:  This document was prepared using Dragon voice recognition software and may include unintentional dictation errors.    Merlyn Lot, MD 07/14/16 0005

## 2016-07-14 ENCOUNTER — Observation Stay
Admit: 2016-07-14 | Discharge: 2016-07-14 | Disposition: A | Discharge status: Home / Self Care | Payer: Self-pay | Attending: Internal Medicine | Admitting: Internal Medicine

## 2016-07-14 LAB — COMPREHENSIVE METABOLIC PANEL
ALBUMIN: 3.1 g/dL — AB (ref 3.5–5.0)
ALK PHOS: 44 U/L (ref 38–126)
ALT: 21 U/L (ref 14–54)
ANION GAP: 4 — AB (ref 5–15)
AST: 24 U/L (ref 15–41)
BUN: 11 mg/dL (ref 6–20)
CALCIUM: 8.1 mg/dL — AB (ref 8.9–10.3)
CHLORIDE: 114 mmol/L — AB (ref 101–111)
CO2: 22 mmol/L (ref 22–32)
Creatinine, Ser: 0.72 mg/dL (ref 0.44–1.00)
GFR calc Af Amer: >60 mL/min (ref >60)
GFR calc non Af Amer: >60 mL/min (ref >60)
GLUCOSE: 89 mg/dL (ref 65–99)
Potassium: 4 mmol/L (ref 3.5–5.1)
SODIUM: 140 mmol/L (ref 135–145)
Total Bilirubin: 0.4 mg/dL (ref 0.3–1.2)
Total Protein: 5.7 g/dL — ABNORMAL LOW (ref 6.5–8.1)

## 2016-07-14 LAB — CBC
HEMATOCRIT: 35.1 % (ref 35.0–47.0)
HEMOGLOBIN: 12.2 g/dL (ref 12.0–16.0)
MCH: 30.3 pg (ref 26.0–34.0)
MCHC: 34.8 g/dL (ref 32.0–36.0)
MCV: 87 fL (ref 80.0–100.0)
Platelets: 205 10*3/uL (ref 150–440)
RBC: 4.03 MIL/uL (ref 3.80–5.20)
RDW: 13 % (ref 11.5–14.5)
WBC: 4.5 10*3/uL (ref 3.6–11.0)

## 2016-07-14 LAB — TROPONIN I: Troponin I: <0.03 ng/mL (ref <0.03)

## 2016-07-14 LAB — ECHOCARDIOGRAM COMPLETE
HEIGHTINCHES: 63 in
WEIGHTICAEL: 2553.6 [oz_av]

## 2016-07-14 LAB — HEPARIN LEVEL (UNFRACTIONATED)
HEPARIN UNFRACTIONATED: 0.53 [IU]/mL (ref 0.30–0.70)
HEPARIN UNFRACTIONATED: 0.39 [IU]/mL (ref 0.30–0.70)
Heparin Unfractionated: 0.69 IU/mL (ref 0.30–0.70)

## 2016-07-14 MED ORDER — HEPARIN BOLUS VIA INFUSION
2000.0000 [IU] | Freq: Once | INTRAVENOUS | Status: AC
  Administered 2016-07-14: 2000 [IU] via INTRAVENOUS
  Filled 2016-07-14: qty 2000

## 2016-07-14 MED ORDER — ATORVASTATIN CALCIUM 20 MG PO TABS
40.0000 mg | ORAL_TABLET | Freq: Every day | ORAL | Status: DC
  Administered 2016-07-14: 40 mg via ORAL
  Filled 2016-07-14: qty 2

## 2016-07-14 MED ORDER — SODIUM CHLORIDE 0.9% FLUSH
3.0000 mL | Freq: Two times a day (BID) | INTRAVENOUS | Status: DC
  Administered 2016-07-14: 3 mL via INTRAVENOUS

## 2016-07-14 MED ORDER — SODIUM CHLORIDE 0.9% FLUSH: 3.0000 mL | INTRAVENOUS | Status: DC | PRN

## 2016-07-14 MED ORDER — ASPIRIN 81 MG PO CHEW
81.0000 mg | CHEWABLE_TABLET | ORAL | Status: AC
  Administered 2016-07-15: 81 mg via ORAL
  Filled 2016-07-14: qty 1

## 2016-07-14 MED ORDER — METOPROLOL TARTRATE 25 MG PO TABS: 25.0000 mg | ORAL_TABLET | Freq: Two times a day (BID) | ORAL | 2 refills | Status: DC

## 2016-07-14 MED ORDER — SODIUM CHLORIDE 0.9 % WEIGHT BASED INFUSION
3.0000 mL/kg/h | INTRAVENOUS | Status: AC
  Administered 2016-07-15: 3 mL/kg/h via INTRAVENOUS

## 2016-07-14 MED ORDER — SODIUM CHLORIDE 0.9 % WEIGHT BASED INFUSION: 1.0000 mL/kg/h | INTRAVENOUS | Status: DC

## 2016-07-14 MED ORDER — SODIUM CHLORIDE 0.9 % IV SOLN: 250.0000 mL | INTRAVENOUS | Status: DC | PRN

## 2016-07-14 MED ORDER — NITROGLYCERIN 0.4 MG SL SUBL: 0.4000 mg | SUBLINGUAL_TABLET | SUBLINGUAL | 0 refills | Status: DC | PRN

## 2016-07-14 MED ORDER — HEPARIN (PORCINE) IN NACL 100-0.45 UNIT/ML-% IJ SOLN
850.0000 [IU]/h | INTRAMUSCULAR | Status: DC
  Administered 2016-07-14 (×2): 850 [IU]/h via INTRAVENOUS
  Filled 2016-07-14 (×2): qty 250

## 2016-07-14 NOTE — Progress Notes (Signed)
ANTICOAGULATION CONSULT NOTE - Follow Up  Pharmacy Consult for heparin drip Indication: chest pain/ACS  Allergies  Allergen Reactions  . Other Other (See Comments)    Pet dander  . Codeine Itching  . Diphenhydramine Anxiety  . Etodolac Rash  . Meperidine Rash  . Prednisone Rash  . Venlafaxine Rash    Patient Measurements: Height: 5\' 3"  (160 cm) Weight: 159 lb 9.6 oz (72.4 kg) IBW/kg (Calculated) : 52.4 Heparin Dosing Weight: 70   Vital Signs: Temp: 98 F (36.7 C) (12/03 1456) Temp Source: Oral (12/03 1456) BP: 138/78 (12/03 1456) Pulse Rate: 78 (12/03 1456)  Labs:  Recent Labs  07/13/16 1510 07/13/16 2125 07/14/16 0056 07/14/16 0416 07/14/16 0723 07/14/16 1815  HGB 14.4  --   --  12.2  --   --   HCT 41.6  --   --  35.1  --   --   PLT 266  --   --  205  --   --   APTT 29  --   --   --   --   --   LABPROT 12.6  --   --   --   --   --   INR 0.94  --   --   --   --   --   HEPARINUNFRC  --   --  0.69  --  0.53 0.39  CREATININE 0.73  --   --  0.72  --   --   TROPONINI <0.03 <0.03  --  <0.03 <0.03  --     Estimated Creatinine Clearance: 68.6 mL/min (by C-G formula based on SCr of 0.72 mg/dL).   Medical History: Past Medical History:  Diagnosis Date  . Asthma   . COPD (chronic obstructive pulmonary disease) ([place])   . Hypertension   . Migraine      Assessment: 65 yo female with left arm  and chest pain. Pharmacy consulted for heparin dosing and monitoring.   Goal of Therapy:  Heparin level 0.3-0.7 units/ml Monitor platelets by anticoagulation protocol: Yes   Plan:  12/3 07:23 HL = 0.53.  Will recheck HL with am labs.  HEPARIN D/CD on 12/2 at 0919 then Pharmacy was re-consulted for restart of heparin drip at 1215.  12/3 1230 Will Bolus with Heparin 2000units and restart heparin drip at heparin 850untis/hr. Will check HL in 6 hours.   12/3 1815 HL: 0.39. Will continue with heparin 850units/hr. Will recheck  HL in 6 hours.   Pernell Dupre,  PharmD, BCPS Clinical Pharmacist 07/14/2016 6:49 PM

## 2016-07-14 NOTE — Progress Notes (Signed)
ANTICOAGULATION CONSULT NOTE - Follow Up  Pharmacy Consult for heparin drip Indication: chest pain/ACS  Allergies  Allergen Reactions  . Other Other (See Comments)    Pet dander  . Codeine Itching  . Diphenhydramine Anxiety  . Etodolac Rash  . Meperidine Rash  . Prednisone Rash  . Venlafaxine Rash    Patient Measurements: Height: 5\' 3"  (160 cm) Weight: 159 lb 9.6 oz (72.4 kg) IBW/kg (Calculated) : 52.4 Heparin Dosing Weight: 70   Vital Signs: Temp: 98 F (36.7 C) (12/03 0435) Temp Source: Oral (12/02 2157) BP: 113/58 (12/03 0435) Pulse Rate: 65 (12/03 0435)  Labs:  Recent Labs  07/13/16 1510 07/13/16 2125 07/14/16 0056 07/14/16 0416 07/14/16 0723  HGB 14.4  --   --  12.2  --   HCT 41.6  --   --  35.1  --   PLT 266  --   --  205  --   APTT 29  --   --   --   --   LABPROT 12.6  --   --   --   --   INR 0.94  --   --   --   --   HEPARINUNFRC  --   --  0.69  --  0.53  CREATININE 0.73  --   --  0.72  --   TROPONINI <0.03 <0.03  --  <0.03  --     Estimated Creatinine Clearance: 68.6 mL/min (by C-G formula based on SCr of 0.72 mg/dL).   Medical History: Past Medical History:  Diagnosis Date  . Asthma   . COPD (chronic obstructive pulmonary disease) ([place])   . Hypertension   . Migraine      Assessment: 64 yo female with left arm  and chest pain. Pharmacy consulted for heparin dosing and monitoring.   Goal of Therapy:  Heparin level 0.3-0.7 units/ml Monitor platelets by anticoagulation protocol: Yes   Plan:  Baseline labs ordered. DW: 70kg  Give 4000 units bolus x 1 Start heparin infusion at 850 units/hr Check anti-Xa level in 6 hours and daily while on heparin Continue to monitor H&H and platelets  12/3 01:00 heparin level 0.69. Recheck in 6 hours to confirm. 12/3 07:23 HL = 0.53.  Will recheck HL with am labs.  Eliani Leclere K,RPh Clinical Pharmacist 07/14/2016 8:04 AM

## 2016-07-14 NOTE — Progress Notes (Signed)
ANTICOAGULATION CONSULT NOTE - Initial Consult  Pharmacy Consult for heparin drip Indication: chest pain/ACS  Allergies  Allergen Reactions  . Other Other (See Comments)    Pet dander  . Codeine Itching  . Diphenhydramine Anxiety  . Etodolac Rash  . Meperidine Rash  . Prednisone Rash  . Venlafaxine Rash    Patient Measurements: Height: 5\' 3"  (160 cm) Weight: 168 lb (76.2 kg) IBW/kg (Calculated) : 52.4 Heparin Dosing Weight: 70   Vital Signs: Temp: 97.6 F (36.4 C) (12/02 2157) Temp Source: Oral (12/02 2157) BP: 127/75 (12/02 2157) Pulse Rate: 73 (12/02 2157)  Labs:  Recent Labs  07/13/16 1510 07/13/16 2125 07/14/16 0056  HGB 14.4  --   --   HCT 41.6  --   --   PLT 266  --   --   APTT 29  --   --   LABPROT 12.6  --   --   INR 0.94  --   --   HEPARINUNFRC  --   --  0.69  CREATININE 0.73  --   --   TROPONINI <0.03 <0.03  --     Estimated Creatinine Clearance: 70.3 mL/min (by C-G formula based on SCr of 0.73 mg/dL).   Medical History: Past Medical History:  Diagnosis Date  . Asthma   . COPD (chronic obstructive pulmonary disease) ([place])   . Hypertension   . Migraine      Assessment: 65 yo female with left arm  and chest pain. Pharmacy consulted for heparin dosing and monitoring.   Goal of Therapy:  Heparin level 0.3-0.7 units/ml Monitor platelets by anticoagulation protocol: Yes   Plan:  Baseline labs ordered. DW: 70kg  Give 4000 units bolus x 1 Start heparin infusion at 850 units/hr Check anti-Xa level in 6 hours and daily while on heparin Continue to monitor H&H and platelets  12/3 01:00 heparin level 0.69. Recheck in 6 hours to confirm.  Eloise Harman, PharmD, BCPS Clinical Pharmacist 07/14/2016 2:15 AM

## 2016-07-14 NOTE — Progress Notes (Signed)
*  PRELIMINARY RESULTS* Echocardiogram 2D Echocardiogram has been performed.  Andrea Ford [name] 07/14/2016, 8:48 AM

## 2016-07-14 NOTE — Progress Notes (Signed)
Andrea Ford is a 65 y.o. female  [id number]  Primary Cardiologist: Neoma Laming Reason for Consultation: Chest pain and abnormal EKG  HPI: This is a 65 year old white female with a past medical history of asthma COPD hypertension and migraine presented to the hospital with chest pain and tachycardia associated with shortness of breath and diaphoresis. Chest pain was pressure type in the retrosternal area like an elephant sitting on her chest.   Review of Systems: No orthopnea PND or leg swelling   Past Medical History:  Diagnosis Date  . Asthma   . COPD (chronic obstructive pulmonary disease) ([place])   . Hypertension   . Migraine     Medications Prior to Admission  Medication Sig Dispense Refill  . aspirin 81 MG chewable tablet Chew 81 mg by mouth daily.    . busPIRone (BUSPAR) 7.5 MG tablet Take 7.5 mg by mouth 2 (two) times daily.    . Cholecalciferol (VITAMIN D-3 PO) Take 1 tablet by mouth daily.    . citalopram (CELEXA) 40 MG tablet Take 40 mg by mouth daily.    [name] losartan (COZAAR) 100 MG tablet Take 100 mg by mouth daily.    [name] albuterol (PROVENTIL HFA;VENTOLIN HFA) 108 (90 BASE) MCG/ACT inhaler Inhale into the lungs every 6 (six) hours as needed for wheezing or shortness of breath.    . ciprofloxacin (CILOXAN) 0.3 % ophthalmic solution Place 2 drops into the right ear every 4 (four) hours while awake. 5 mL 0  . hydrOXYzine (ATARAX/VISTARIL) 25 MG tablet Take 25 mg by mouth daily as needed.    . ondansetron (ZOFRAN ODT) 4 MG disintegrating tablet Take 1 tablet (4 mg total) by mouth every 8 (eight) hours as needed for nausea or vomiting. 20 tablet 0     . aspirin  81 mg Oral Daily  . busPIRone  7.5 mg Oral BID  . citalopram  40 mg Oral Daily  . docusate sodium  100 mg Oral BID  . losartan  50 mg Oral BID  . metoprolol tartrate  25 mg Oral BID  . nitroGLYCERIN  2 inch Topical Q6H  . sodium chloride flush  3 mL Intravenous Q12H    Infusions:   Allergies   Allergen Reactions  . Other Other (See Comments)    Pet dander  . Codeine Itching  . Diphenhydramine Anxiety  . Etodolac Rash  . Meperidine Rash  . Prednisone Rash  . Venlafaxine Rash    Social History   Social History  . Marital status: Divorced    Spouse name: N/A  . Number of children: N/A  . Years of education: N/A   Occupational History  . Not on file.   Social History Main Topics  . Smoking status: Former Research scientist (life sciences)  . Smokeless tobacco: Never Used  . Alcohol use No  . Drug use: No  . Sexual activity: Not on file   Other Topics Concern  . Not on file   Social History Narrative  . No narrative on file    History reviewed. No pertinent family history.  PHYSICAL EXAM: Vitals:   07/13/16 2157 07/14/16 0435  BP: 127/75 (!) 113/58  Pulse: 73 65  Resp: 15 15  Temp: 97.6 F (36.4 C) 98 F (36.7 C)     Intake/Output Summary (Last 24 hours) at 07/14/16 1147 Last data filed at 07/13/16 2221  Gross per 24 hour  Intake  3 ml  Output                0 ml  Net                3 ml    General:  Well appearing. No respiratory difficulty HEENT: normal Neck: supple. no JVD. Carotids 2+ bilat; no bruits. No lymphadenopathy or thryomegaly appreciated. Cor: PMI nondisplaced. Regular rate & rhythm. No rubs, gallops or murmurs. Lungs: clear Abdomen: soft, nontender, nondistended. No hepatosplenomegaly. No bruits or masses. Good bowel sounds. Extremities: no cyanosis, clubbing, rash, edema Neuro: alert & oriented x 3, cranial nerves grossly intact. moves all 4 extremities w/o difficulty. Affect pleasant.  [id number] rhythm with sinus tachycardia and ST depression diffusely anterior inferior and lateral leads. F2  Results for orders placed or performed during the hospital encounter of 07/13/16 (from the past 24 hour(s))  Basic metabolic panel     Status: Abnormal   Collection Time: 07/13/16  3:10 PM  Result Value Ref Range   Sodium 139 135 - 145 mmol/L    Potassium 3.4 (L) 3.5 - 5.1 mmol/L   Chloride 105 101 - 111 mmol/L   CO2 24 22 - 32 mmol/L   Glucose, Bld 146 (H) 65 - 99 mg/dL   BUN 13 6 - 20 mg/dL   Creatinine, Ser 0.73 0.44 - 1.00 mg/dL   Calcium 8.8 (L) 8.9 - 10.3 mg/dL   GFR calc non Af Amer >60 >60 mL/min   GFR calc Af Amer >60 >60 mL/min   Anion gap 10 5 - 15  CBC     Status: None   Collection Time: 07/13/16  3:10 PM  Result Value Ref Range   WBC 6.3 3.6 - 11.0 K/uL   RBC 4.82 3.80 - 5.20 MIL/uL   Hemoglobin 14.4 12.0 - 16.0 g/dL   HCT 41.6 35.0 - 47.0 %   MCV 86.5 80.0 - 100.0 fL   MCH 29.9 26.0 - 34.0 pg   MCHC 34.6 32.0 - 36.0 g/dL   RDW 12.7 11.5 - 14.5 %   Platelets 266 150 - 440 K/uL  Troponin I     Status: None   Collection Time: 07/13/16  3:10 PM  Result Value Ref Range   Troponin I <0.03 <0.03 ng/mL  Protime-INR     Status: None   Collection Time: 07/13/16  3:10 PM  Result Value Ref Range   Prothrombin Time 12.6 11.4 - 15.2 seconds   INR 0.94   APTT     Status: None   Collection Time: 07/13/16  3:10 PM  Result Value Ref Range   aPTT 29 24 - 36 seconds  Troponin I     Status: None   Collection Time: 07/13/16  9:25 PM  Result Value Ref Range   Troponin I <0.03 <0.03 ng/mL  Heparin level (unfractionated)     Status: None   Collection Time: 07/14/16 12:56 AM  Result Value Ref Range   Heparin Unfractionated 0.69 0.30 - 0.70 IU/mL  Troponin I     Status: None   Collection Time: 07/14/16  4:16 AM  Result Value Ref Range   Troponin I <0.03 <0.03 ng/mL  Comprehensive metabolic panel     Status: Abnormal   Collection Time: 07/14/16  4:16 AM  Result Value Ref Range   Sodium 140 135 - 145 mmol/L   Potassium 4.0 3.5 - 5.1 mmol/L   Chloride 114 (H) 101 - 111 mmol/L   CO2 22 22 -  32 mmol/L   Glucose, Bld 89 65 - 99 mg/dL   BUN 11 6 - 20 mg/dL   Creatinine, Ser 0.72 0.44 - 1.00 mg/dL   Calcium 8.1 (L) 8.9 - 10.3 mg/dL   Total Protein 5.7 (L) 6.5 - 8.1 g/dL   Albumin 3.1 (L) 3.5 - 5.0 g/dL   AST 24 15 - 41  U/L   ALT 21 14 - 54 U/L   Alkaline Phosphatase 44 38 - 126 U/L   Total Bilirubin 0.4 0.3 - 1.2 mg/dL   GFR calc non Af Amer >60 >60 mL/min   GFR calc Af Amer >60 >60 mL/min   Anion gap 4 (L) 5 - 15  CBC     Status: None   Collection Time: 07/14/16  4:16 AM  Result Value Ref Range   WBC 4.5 3.6 - 11.0 K/uL   RBC 4.03 3.80 - 5.20 MIL/uL   Hemoglobin 12.2 12.0 - 16.0 g/dL   HCT 35.1 35.0 - 47.0 %   MCV 87.0 80.0 - 100.0 fL   MCH 30.3 26.0 - 34.0 pg   MCHC 34.8 32.0 - 36.0 g/dL   RDW 13.0 11.5 - 14.5 %   Platelets 205 150 - 440 K/uL  Troponin I     Status: None   Collection Time: 07/14/16  7:23 AM  Result Value Ref Range   Troponin I <0.03 <0.03 ng/mL  Heparin level (unfractionated)     Status: None   Collection Time: 07/14/16  7:23 AM  Result Value Ref Range   Heparin Unfractionated 0.53 0.30 - 0.70 IU/mL   Dg Chest 2 View  Result Date: 07/13/2016 CLINICAL DATA:  Chest and left arm pain today.  Ex-smoker. EXAM: CHEST  2 VIEW COMPARISON:  10/19/2015. FINDINGS: Normal sized heart. Clear lungs. Bilateral breast implants with capsular calcifications. Unremarkable bones. IMPRESSION: No acute abnormality. Electronically Signed   By: Claudie Revering M.D.   On: 07/13/2016 15:09   Ct Angio Chest Pe W And/or Wo Contrast  Result Date: 07/13/2016 CLINICAL DATA:  65 year old female with history of increased higher rate in shortness of breath that started today. Left-sided chest pain radiating into the left arm. Former smoker (quit 40 years ago). EXAM: CT ANGIOGRAPHY CHEST WITH CONTRAST TECHNIQUE: Multidetector CT imaging of the chest was performed using the standard protocol during bolus administration of intravenous contrast. Multiplanar CT image reconstructions and MIPs were obtained to evaluate the vascular anatomy. CONTRAST:  75 mL of Isovue 370. COMPARISON:  No priors. FINDINGS: Cardiovascular: Study is limited by respiratory motion such that accurate assessment for distal subsegmental sized  pulmonary emboli is not possible. With this limitation in mind, there is no evidence to suggest clinically relevant central, lobar or segmental sized embolus. Heart size is normal. There is no significant pericardial fluid, thickening or pericardial calcification. There is aortic atherosclerosis, as well as atherosclerosis of the great vessels of the mediastinum and the coronary arteries, including calcified atherosclerotic plaque in the left anterior descending coronary artery. Mediastinum/Nodes: No pathologically enlarged mediastinal or hilar lymph nodes. Esophagus is unremarkable in appearance. No axillary lymphadenopathy. Lungs/Pleura: Small calcified granuloma in the lingula. No other suspicious appearing pulmonary nodules or masses are noted. There are areas of dependent atelectasis and/or scarring throughout the lungs bilaterally, accentuated by low lung volumes. No acute consolidative airspace disease. No pleural effusions. Upper Abdomen: Unremarkable. Musculoskeletal: Bilateral densely calcified subglandular breast implants incidentally noted. There are no aggressive appearing lytic or blastic lesions noted in the visualized portions of  the skeleton. Review of the MIP images confirms the above findings. IMPRESSION: 1. Despite the limitations of today's examination there is no evidence to suggest clinically significant central, lobar or segmental sized pulmonary embolism. Smaller distal subsegmental sized emboli cannot be completely excluded. 2. No acute findings in the thorax to account for the patient's symptoms. 3. Aortic atherosclerosis, in addition to left anterior descending coronary artery disease. Please note that although the presence of coronary artery calcium documents the presence of coronary artery disease, the severity of this disease and any potential stenosis cannot be assessed on this non-gated CT examination. Assessment for potential risk factor modification, dietary therapy or  pharmacologic therapy may be warranted, if clinically indicated. 4. Additional incidental findings, as above. Electronically Signed   By: Vinnie Langton M.D.   On: 07/13/2016 18:15     ASSESSMENT AND PLAN: Acute coronary syndrome with significant ST depression diffusely on EKG associated with chest pain, advise risk and benefits and schedule the patient for cardiac catheterization tomorrow.  Deangelo Berns A

## 2016-07-14 NOTE — Progress Notes (Signed)
ANTICOAGULATION CONSULT NOTE - Follow Up  Pharmacy Consult for heparin drip Indication: chest pain/ACS  Allergies  Allergen Reactions  . Other Other (See Comments)    Pet dander  . Codeine Itching  . Diphenhydramine Anxiety  . Etodolac Rash  . Meperidine Rash  . Prednisone Rash  . Venlafaxine Rash    Patient Measurements: Height: 5\' 3"  (160 cm) Weight: 159 lb 9.6 oz (72.4 kg) IBW/kg (Calculated) : 52.4 Heparin Dosing Weight: 70   Vital Signs: Temp: 98 F (36.7 C) (12/03 0435) BP: 113/58 (12/03 0435) Pulse Rate: 65 (12/03 0435)  Labs:  Recent Labs  07/13/16 1510 07/13/16 2125 07/14/16 0056 07/14/16 0416 07/14/16 0723  HGB 14.4  --   --  12.2  --   HCT 41.6  --   --  35.1  --   PLT 266  --   --  205  --   APTT 29  --   --   --   --   LABPROT 12.6  --   --   --   --   INR 0.94  --   --   --   --   HEPARINUNFRC  --   --  0.69  --  0.53  CREATININE 0.73  --   --  0.72  --   TROPONINI <0.03 <0.03  --  <0.03 <0.03    Estimated Creatinine Clearance: 68.6 mL/min (by C-G formula based on SCr of 0.72 mg/dL).   Medical History: Past Medical History:  Diagnosis Date  . Asthma   . COPD (chronic obstructive pulmonary disease) ([place])   . Hypertension   . Migraine      Assessment: 65 yo female with left arm  and chest pain. Pharmacy consulted for heparin dosing and monitoring.   Goal of Therapy:  Heparin level 0.3-0.7 units/ml Monitor platelets by anticoagulation protocol: Yes   Plan:  12/3 07:23 HL = 0.53.  Will recheck HL with am labs.  HEPARIN D/CD on 12/2 at 0919 then Pharmacy was re-consulted for restart of heparin drip at 1215.  Will Bolus with Heparin 2000units and restart heparin drip at heparin 850untis/hr. Will check HL in 6 hours.   Pernell Dupre, PharmD, BCPS Clinical Pharmacist 07/14/2016 12:24 PM

## 2016-07-14 NOTE — Care Management (Signed)
I prayed with Andrea Ford this morning. She believes the [name] is going to work things out and trust the staff but has had some anxiety wondering what will happen and has been a bit more concerned because she says heart attacks occur in her family. She was encouraged after our conversation and prayer and I will have the on-call Chaplain follow up and see how she is throughout the day.

## 2016-07-14 NOTE — Progress Notes (Signed)
[facility] [place] NAME: Andrea Ford    MR#:  [id number]  DATE OF BIRTH:  1950-12-07  SUBJECTIVE:  CHIEF COMPLAINT:   Chief Complaint  Patient presents with  . Chest Pain   No chest pain. But has toothache. REVIEW OF SYSTEMS:  Review of Systems  Constitutional: Negative for chills, fever and malaise/fatigue.  HENT: Negative for congestion, ear pain and sore throat.        Toothache  Eyes: Negative for blurred vision and double vision.  Respiratory: Negative for cough, shortness of breath and stridor.   Cardiovascular: Negative for chest pain, palpitations and PND.  Gastrointestinal: Negative for abdominal pain, blood in stool, diarrhea, melena, nausea and vomiting.  Genitourinary: Negative for dysuria and hematuria.  Musculoskeletal: Negative for joint pain.  Skin: Negative for itching and rash.  Neurological: Negative for dizziness, focal weakness and loss of consciousness.  Psychiatric/Behavioral: Negative for depression. The patient is not nervous/anxious.     DRUG ALLERGIES:   Allergies  Allergen Reactions  . Other Other (See Comments)    Pet dander  . Codeine Itching  . Diphenhydramine Anxiety  . Etodolac Rash  . Meperidine Rash  . Prednisone Rash  . Venlafaxine Rash   VITALS:  Blood pressure (!) 113/58, pulse 65, temperature 98 F (36.7 C), resp. rate 15, height 5\' 3"  (1.6 m), weight 159 lb 9.6 oz (72.4 kg), SpO2 96 %. PHYSICAL EXAMINATION:  Physical Exam  Constitutional: She is oriented to person, place, and time and well-developed, well-nourished, and in no distress.  HENT:  Head: Normocephalic.  Mouth/Throat: Oropharynx is clear and moist.  Eyes: Conjunctivae and EOM are normal.  Neck: Normal range of motion. Neck supple. No JVD present. No tracheal deviation present. No thyromegaly present.  Cardiovascular: Normal rate, regular rhythm and normal heart sounds.  Exam reveals no gallop.   No murmur  heard. Pulmonary/Chest: Effort normal and breath sounds normal. No respiratory distress. She has no wheezes. She has no rales.  Abdominal: Soft. Bowel sounds are normal. She exhibits no distension. There is no tenderness.  Musculoskeletal: Normal range of motion. She exhibits no edema or tenderness.  Neurological: She is alert and oriented to person, place, and time. No cranial nerve deficit.  Skin: No rash noted. No erythema.  Psychiatric: Affect and judgment normal.   LABORATORY PANEL:   CBC  Recent Labs Lab 07/14/16 0416  WBC 4.5  HGB 12.2  HCT 35.1  PLT 205   ------------------------------------------------------------------------------------------------------------------ Chemistries   Recent Labs Lab 07/14/16 0416  NA 140  K 4.0  CL 114*  CO2 22  GLUCOSE 89  BUN 11  CREATININE 0.72  CALCIUM 8.1*  AST 24  ALT 21  ALKPHOS 44  BILITOT 0.4   RADIOLOGY:  Dg Chest 2 View  Result Date: 07/13/2016 CLINICAL DATA:  Chest and left arm pain today.  Ex-smoker. EXAM: CHEST  2 VIEW COMPARISON:  10/19/2015. FINDINGS: Normal sized heart. Clear lungs. Bilateral breast implants with capsular calcifications. Unremarkable bones. IMPRESSION: No acute abnormality. Electronically Signed   By: Claudie Revering M.D.   On: 07/13/2016 15:09   Ct Angio Chest Pe W And/or Wo Contrast  Result Date: 07/13/2016 CLINICAL DATA:  65 year old female with history of increased higher rate in shortness of breath that started today. Left-sided chest pain radiating into the left arm. Former smoker (quit 40 years ago). EXAM: CT ANGIOGRAPHY CHEST WITH CONTRAST TECHNIQUE: Multidetector CT imaging of the chest was performed using  the standard protocol during bolus administration of intravenous contrast. Multiplanar CT image reconstructions and MIPs were obtained to evaluate the vascular anatomy. CONTRAST:  75 mL of Isovue 370. COMPARISON:  No priors. FINDINGS: Cardiovascular: Study is limited by respiratory motion  such that accurate assessment for distal subsegmental sized pulmonary emboli is not possible. With this limitation in mind, there is no evidence to suggest clinically relevant central, lobar or segmental sized embolus. Heart size is normal. There is no significant pericardial fluid, thickening or pericardial calcification. There is aortic atherosclerosis, as well as atherosclerosis of the great vessels of the mediastinum and the coronary arteries, including calcified atherosclerotic plaque in the left anterior descending coronary artery. Mediastinum/Nodes: No pathologically enlarged mediastinal or hilar lymph nodes. Esophagus is unremarkable in appearance. No axillary lymphadenopathy. Lungs/Pleura: Small calcified granuloma in the lingula. No other suspicious appearing pulmonary nodules or masses are noted. There are areas of dependent atelectasis and/or scarring throughout the lungs bilaterally, accentuated by low lung volumes. No acute consolidative airspace disease. No pleural effusions. Upper Abdomen: Unremarkable. Musculoskeletal: Bilateral densely calcified subglandular breast implants incidentally noted. There are no aggressive appearing lytic or blastic lesions noted in the visualized portions of the skeleton. Review of the MIP images confirms the above findings. IMPRESSION: 1. Despite the limitations of today's examination there is no evidence to suggest clinically significant central, lobar or segmental sized pulmonary embolism. Smaller distal subsegmental sized emboli cannot be completely excluded. 2. No acute findings in the thorax to account for the patient's symptoms. 3. Aortic atherosclerosis, in addition to left anterior descending coronary artery disease. Please note that although the presence of coronary artery calcium documents the presence of coronary artery disease, the severity of this disease and any potential stenosis cannot be assessed on this non-gated CT examination. Assessment for  potential risk factor modification, dietary therapy or pharmacologic therapy may be warranted, if clinically indicated. 4. Additional incidental findings, as above. Electronically Signed   By: Vinnie Langton M.D.   On: 07/13/2016 18:15   ASSESSMENT AND PLAN:   Acute coronary syndrome with significant ST depression diffusely on EKG associated with chest pain Continue heparin drip, aspirin and add Lipitor. Nitroglycerin when necessary. Echo shows normal LVEF and mild diastolic dysfunction. Cardiac cath tomorrow per Dr. Humphrey Rolls.   Accelerated hypertension. Continue losartan and add Lopressor.  All the records are reviewed and case discussed with Care Management/Social Worker. Management plans discussed with the patient, family and they are in agreement.  CODE STATUS: Full code  TOTAL TIME TAKING CARE OF THIS PATIENT: 33 minutes.   More than 50% of the time was spent in counseling/coordination of care: YES  POSSIBLE D/C IN 1 DAYS, DEPENDING ON CLINICAL CONDITION.   Demetrios Loll M.D on 07/14/2016 at 1:46 PM  Between 7am to 6pm - Pager - [phone]  After 6pm go to [email or web address] - Proofreader  [facility]  Office  [phone]  CC: Primary care physician; PROVIDER NOT IN SYSTEM  Note: This dictation was prepared with Dragon dictation along with smaller phrase technology. Any transcriptional errors that result from this process are unintentional.

## 2016-07-14 NOTE — Progress Notes (Signed)
Pt arrived from EMS alert and oriented X 4.  No c/o pain, pt is ambulatory. No skin issues. Telemetry bix and skin verified with second RN, Sharyn Lull. Pt is oriented to unit and staff. No concerns offered.

## 2016-07-15 ENCOUNTER — Encounter
Admission: EM | Disposition: A | Discharge status: Home / Self Care | Payer: Self-pay | Source: Home / Self Care | Attending: Student in an Organized Health Care Education/Training Program

## 2016-07-15 ENCOUNTER — Encounter: Payer: Self-pay | Admitting: Cardiovascular Disease

## 2016-07-15 HISTORY — PX: CARDIAC CATHETERIZATION: SHX172

## 2016-07-15 LAB — LIPID PANEL
CHOLESTEROL: 210 mg/dL — AB (ref 0–200)
HDL: 61 mg/dL (ref >40)
LDL Cholesterol: 125 mg/dL — ABNORMAL HIGH (ref 0–99)
Total CHOL/HDL Ratio: 3.4 RATIO
Triglycerides: 118 mg/dL (ref <150)
VLDL: 24 mg/dL (ref 0–40)

## 2016-07-15 LAB — HEMOGLOBIN: Hemoglobin: 13.2 g/dL (ref 12.0–16.0)

## 2016-07-15 LAB — HEPARIN LEVEL (UNFRACTIONATED): HEPARIN UNFRACTIONATED: 0.46 [IU]/mL (ref 0.30–0.70)

## 2016-07-15 LAB — PLATELET COUNT: PLATELETS: 234 10*3/uL (ref 150–440)

## 2016-07-15 SURGERY — LEFT HEART CATH AND CORONARY ANGIOGRAPHY
Anesthesia: Moderate Sedation | Laterality: Right

## 2016-07-15 MED ORDER — FENTANYL CITRATE (PF) 100 MCG/2ML IJ SOLN
INTRAMUSCULAR | Status: AC
  Filled 2016-07-15: qty 2

## 2016-07-15 MED ORDER — HEPARIN (PORCINE) IN NACL 2-0.9 UNIT/ML-% IJ SOLN
INTRAMUSCULAR | Status: AC
  Filled 2016-07-15: qty 500

## 2016-07-15 MED ORDER — FENTANYL CITRATE (PF) 100 MCG/2ML IJ SOLN
INTRAMUSCULAR | Status: DC | PRN
  Administered 2016-07-15: 50 ug via INTRAVENOUS

## 2016-07-15 MED ORDER — ASPIRIN 81 MG PO CHEW: 81.0000 mg | CHEWABLE_TABLET | Freq: Every day | ORAL | 2 refills | Status: DC

## 2016-07-15 MED ORDER — MIDAZOLAM HCL 2 MG/2ML IJ SOLN
INTRAMUSCULAR | Status: DC | PRN
  Administered 2016-07-15 (×2): 1 mg via INTRAVENOUS

## 2016-07-15 MED ORDER — ATORVASTATIN CALCIUM 40 MG PO TABS: 40.0000 mg | ORAL_TABLET | Freq: Every day | ORAL | 2 refills | Status: AC

## 2016-07-15 MED ORDER — MIDAZOLAM HCL 2 MG/2ML IJ SOLN
INTRAMUSCULAR | Status: AC
  Filled 2016-07-15: qty 2

## 2016-07-15 SURGICAL SUPPLY — 9 items
CATH 5FR JL4 DIAGNOSTIC (CATHETERS) ×2 IMPLANT
CATH 5FR JR4 DIAGNOSTIC (CATHETERS) ×2 IMPLANT
CATH 5FR PIGTAIL DIAGNOSTIC (CATHETERS) ×2 IMPLANT
DEVICE CLOSURE MYNXGRIP 5F (Vascular Products) ×2 IMPLANT
KIT MANI 3VAL PERCEP (MISCELLANEOUS) ×2 IMPLANT
NEEDLE PERC 18GX7CM (NEEDLE) ×2 IMPLANT
PACK CARDIAC CATH (CUSTOM PROCEDURE TRAY) ×2 IMPLANT
SHEATH PINNACLE 5F 10CM (SHEATH) ×2 IMPLANT
WIRE EMERALD 3MM-J .035X150CM (WIRE) ×2 IMPLANT

## 2016-07-15 NOTE — Discharge Instructions (Signed)
Femoral Site Care Introduction Refer to this sheet in the next few weeks. These instructions provide you with information about caring for yourself after your procedure. Your health care provider may also give you more specific instructions. Your treatment has been planned according to current medical practices, but problems sometimes occur. Call your health care provider if you have any problems or questions after your procedure. What can I expect after the procedure? After your procedure, it is typical to have the following:  Bruising at the site that usually fades within 1-2 weeks.  Blood collecting in the tissue (hematoma) that may be painful to the touch. It should usually decrease in size and tenderness within 1-2 weeks. Follow these instructions at home:  Take medicines only as directed by your health care provider.  You may shower 24-48 hours after the procedure or as directed by your health care provider. Remove the bandage (dressing) and gently wash the site with plain soap and water. Pat the area dry with a clean towel. Do not rub the site, because this may cause bleeding.  Do not take baths, swim, or use a hot tub until your health care provider approves.  Check your insertion site every day for redness, swelling, or drainage.  Do not apply powder or lotion to the site.  Limit use of stairs to twice a day for the first 2-3 days or as directed by your health care provider.  Do not squat for the first 2-3 days or as directed by your health care provider.  Do not lift over 10 lb (4.5 kg) for 5 days after your procedure or as directed by your health care provider.  Ask your health care provider when it is okay to:  Return to work or school.  Resume usual physical activities or sports.  Resume sexual activity.  Do not drive home if you are discharged the same day as the procedure. Have someone else drive you.  You may drive 24 hours after the procedure unless otherwise  instructed by your health care provider.  Do not operate machinery or power tools for 24 hours after the procedure or as directed by your health care provider.  If your procedure was done as an outpatient procedure, which means that you went home the same day as your procedure, a responsible adult should be with you for the first 24 hours after you arrive home.  Keep all follow-up visits as directed by your health care provider. This is important. Contact a health care provider if:  You have a fever.  You have chills.  You have increased bleeding from the site. Hold pressure on the site. Get help right away if:  You have unusual pain at the site.  You have redness, warmth, or swelling at the site.  You have drainage (other than a small amount of blood on the dressing) from the site.  The site is bleeding, and the bleeding does not stop after 30 minutes of holding steady pressure on the site.  Your leg or foot becomes pale, cool, tingly, or numb. This information is not intended to replace advice given to you by your health care provider. Make sure you discuss any questions you have with your health care provider. Document Released: 04/01/2014 Document Revised: 01/04/2016 Document Reviewed: 02/15/2014  2017 [employer] Heart healthy diet.

## 2016-07-15 NOTE — Progress Notes (Signed)
May go home with f/u office this Thursday 2 pm.

## 2016-07-15 NOTE — Progress Notes (Signed)
ANTICOAGULATION CONSULT NOTE - Follow Up  Pharmacy Consult for heparin drip Indication: chest pain/ACS  Allergies  Allergen Reactions  . Other Other (See Comments)    Pet dander  . Codeine Itching  . Diphenhydramine Anxiety  . Etodolac Rash  . Meperidine Rash  . Prednisone Rash  . Venlafaxine Rash    Patient Measurements: Height: 5\' 3"  (160 cm) Weight: 159 lb 9.6 oz (72.4 kg) IBW/kg (Calculated) : 52.4 Heparin Dosing Weight: 70   Vital Signs: Temp: 98 F (36.7 C) (12/03 1944) Temp Source: Oral (12/03 1944) BP: 119/44 (12/03 1944) Pulse Rate: 75 (12/03 1944)  Labs:  Recent Labs  07/13/16 1510 07/13/16 2125  07/14/16 0416 07/14/16 0723 07/14/16 1815 07/15/16 0042  HGB 14.4  --   --  12.2  --   --  13.2  HCT 41.6  --   --  35.1  --   --   --   PLT 266  --   --  205  --   --  234  APTT 29  --   --   --   --   --   --   LABPROT 12.6  --   --   --   --   --   --   INR 0.94  --   --   --   --   --   --   HEPARINUNFRC  --   --   < >  --  0.53 0.39 0.46  CREATININE 0.73  --   --  0.72  --   --   --   TROPONINI <0.03 <0.03  --  <0.03 <0.03  --   --   < > = values in this interval not displayed.  Estimated Creatinine Clearance: 68.6 mL/min (by C-G formula based on SCr of 0.72 mg/dL).   Medical History: Past Medical History:  Diagnosis Date  . Asthma   . COPD (chronic obstructive pulmonary disease) ([place])   . Hypertension   . Migraine      Assessment: 65 yo female with left arm  and chest pain. Pharmacy consulted for heparin dosing and monitoring.   Goal of Therapy:  Heparin level 0.3-0.7 units/ml Monitor platelets by anticoagulation protocol: Yes   Plan:  12/3 07:23 HL = 0.53.  Will recheck HL with am labs.  HEPARIN D/CD on 12/2 at 0919 then Pharmacy was re-consulted for restart of heparin drip at 1215.  12/3 1230 Will Bolus with Heparin 2000units and restart heparin drip at heparin 850untis/hr. Will check HL in 6 hours.   12/3 1815 HL: 0.39. Will  continue with heparin 850units/hr. Will recheck  HL in 6 hours.   12/4 01:00 heparin level 0.46. Continue current regimen. Recheck heparin level and CBC with tomorrow AM labs.  Eloise Harman, PharmD, BCPS Clinical Pharmacist 07/15/2016 1:54 AM

## 2016-07-15 NOTE — Progress Notes (Signed)
Patient discharged via wheelchair and private vehicle. IV removed and catheter intact. All discharge instructions given and patient verbalizes understanding. Tele removed and returned. Prescriptions given to patient No distress noted.   

## 2016-07-15 NOTE — Progress Notes (Signed)
SUBJECTIVE: Patient denies any chest pain   Vitals:   07/14/16 1456 07/14/16 1944 07/15/16 0344 07/15/16 0720  BP: 138/78 (!) 119/44 (!) 151/74 (!) 173/81  Pulse: 78 75 (!) 59 72  Resp: 20 18 18 20   Temp: 98 F (36.7 C) 98 F (36.7 C) 97.8 F (36.6 C) 97.8 F (36.6 C)  TempSrc: Oral Oral Oral Oral  SpO2: 95% 96% 99% 99%  Weight:    159 lb (72.1 kg)  Height:    5\' 3"  (1.6 m)    Intake/Output Summary (Last 24 hours) at 07/15/16 0744 Last data filed at 07/15/16 0344  Gross per 24 hour  Intake              240 ml  Output                0 ml  Net              240 ml    LABS: Basic Metabolic Panel:  Recent Labs  07/13/16 1510 07/14/16 0416  NA 139 140  K 3.4* 4.0  CL 105 114*  CO2 24 22  GLUCOSE 146* 89  BUN 13 11  CREATININE 0.73 0.72  CALCIUM 8.8* 8.1*   Liver Function Tests:  Recent Labs  07/14/16 0416  AST 24  ALT 21  ALKPHOS 44  BILITOT 0.4  PROT 5.7*  ALBUMIN 3.1*   No results for input(s): LIPASE, AMYLASE in the last 72 hours. CBC:  Recent Labs  07/13/16 1510 07/14/16 0416 07/15/16 0042  WBC 6.3 4.5  --   HGB 14.4 12.2 13.2  HCT 41.6 35.1  --   MCV 86.5 87.0  --   PLT 266 205 234   Cardiac Enzymes:  Recent Labs  07/13/16 2125 07/14/16 0416 07/14/16 0723  TROPONINI <0.03 <0.03 <0.03   BNP: Invalid input(s): POCBNP D-Dimer: No results for input(s): DDIMER in the last 72 hours. Hemoglobin A1C: No results for input(s): HGBA1C in the last 72 hours. Fasting Lipid Panel:  Recent Labs  07/15/16 0042  CHOL 210*  HDL 61  LDLCALC 125*  TRIG 118  CHOLHDL 3.4   Thyroid Function Tests: No results for input(s): TSH, T4TOTAL, T3FREE, THYROIDAB in the last 72 hours.  Invalid input(s): FREET3 Anemia Panel: No results for input(s): VITAMINB12, FOLATE, FERRITIN, TIBC, IRON, RETICCTPCT in the last 72 hours.   PHYSICAL EXAM General: Well developed, well nourished, in no acute distress HEENT:  Normocephalic and atramatic Neck:  No  JVD.  Lungs: Clear bilaterally to auscultation and percussion. Heart: HRRR . Normal S1 and S2 without gallops or murmurs.  Abdomen: Bowel sounds are positive, abdomen soft and non-tender  Msk:  Back normal, normal gait. Normal strength and tone for age. Extremities: No clubbing, cyanosis or edema.   Neuro: Alert and oriented X 3. Psych:  Good affect, responds appropriately  TELEMETRY:Sinus rhythm  ASSESSMENT AND PLAN: Acute coronary syndrome with ST depressions on EKG suggestive of severe ischemia. Will be having cardiac catheterization today. Findings will be in the chart.  Principal Problem:   Chest pain Active Problems:   Accelerated hypertension   Abnormal EKG   GAD (generalized anxiety disorder)    Andrea David, MD, [facility] 07/15/2016 7:44 AM

## 2016-07-15 NOTE — Progress Notes (Signed)
Pt clinically stable post heart cath, right groin without bleeding nor hematoma, friend present at bedside, denies complaints, vitals stable, report called to [facility] RN on 2a with plan reviewed, sr per monitor,alert and oriented

## 2016-07-15 NOTE — Discharge Summary (Signed)
Andrea Ford at [place] NAME: Andrea Ford    MR#:  [id number]  DATE OF BIRTH:  Jan 08, 1951  DATE OF ADMISSION:  07/13/2016   ADMITTING PHYSICIAN: Idelle Crouch, MD  DATE OF DISCHARGE: 07/15/2016 PRIMARY CARE PHYSICIAN: PROVIDER NOT IN SYSTEM   ADMISSION DIAGNOSIS:  chest pain acute coronary syndrome DISCHARGE DIAGNOSIS:  Principal Problem:   Chest pain Active Problems:   Accelerated hypertension   Abnormal EKG   GAD (generalized anxiety disorder)  SECONDARY DIAGNOSIS:   Past Medical History:  Diagnosis Date  . Asthma   . COPD (chronic obstructive pulmonary disease) ([place])   . Hypertension   . Migraine    HOSPITAL COURSE:   Acute coronary syndrome with significant ST depression diffusely on EKG associated with chest pain She was on heparin drip. Echo shows normal LVEF and mild diastolic dysfunction. Cardiac cath today showed no stenosis per Dr. Humphrey Rolls On aspirin and added Lipitor. Nitroglycerin when necessary.  Accelerated hypertension. Continue losartan and added Lopressor.  Hyperlipidemia. LDL 125. Continue Lipitor.  DISCHARGE CONDITIONS:  Stable, discharge to home today. CONSULTS OBTAINED:  Treatment Team:  Dionisio David, MD DRUG ALLERGIES:   Allergies  Allergen Reactions  . Other Other (See Comments)    Pet dander  . Codeine Itching  . Diphenhydramine Anxiety  . Etodolac Rash  . Meperidine Rash  . Prednisone Rash  . Venlafaxine Rash   DISCHARGE MEDICATIONS:     Medication List    TAKE these medications   albuterol 108 (90 Base) MCG/ACT inhaler Commonly known as:  PROVENTIL HFA;VENTOLIN HFA Inhale into the lungs every 6 (six) hours as needed for wheezing or shortness of breath.   aspirin 81 MG chewable tablet Chew 1 tablet (81 mg total) by mouth daily.   atorvastatin 40 MG tablet Commonly known as:  LIPITOR Take 1 tablet (40 mg total) by mouth daily at 6 PM.   busPIRone 7.5 MG  tablet Commonly known as:  BUSPAR Take 7.5 mg by mouth 2 (two) times daily.   ciprofloxacin 0.3 % ophthalmic solution Commonly known as:  CILOXAN Place 2 drops into the right ear every 4 (four) hours while awake.   citalopram 40 MG tablet Commonly known as:  CELEXA Take 40 mg by mouth daily.   hydrOXYzine 25 MG tablet Commonly known as:  ATARAX/VISTARIL Take 25 mg by mouth daily as needed.   losartan 100 MG tablet Commonly known as:  COZAAR Take 100 mg by mouth daily.   metoprolol tartrate 25 MG tablet Commonly known as:  LOPRESSOR Take 1 tablet (25 mg total) by mouth 2 (two) times daily.   nitroGLYCERIN 0.4 MG SL tablet Commonly known as:  NITROSTAT Place 1 tablet (0.4 mg total) under the tongue every 5 (five) minutes as needed for chest pain.   ondansetron 4 MG disintegrating tablet Commonly known as:  ZOFRAN ODT Take 1 tablet (4 mg total) by mouth every 8 (eight) hours as needed for nausea or vomiting.   VITAMIN D-3 PO Take 1 tablet by mouth daily.        DISCHARGE INSTRUCTIONS:  See AVS.  If you experience worsening of your admission symptoms, develop shortness of breath, life threatening emergency, suicidal or homicidal thoughts you must seek medical attention immediately by calling 911 or calling your MD immediately  if symptoms less severe.  You Must read complete instructions/literature along with all the possible adverse reactions/side effects for all the Medicines you take and that  have been prescribed to you. Take any new Medicines after you have completely understood and accpet all the possible adverse reactions/side effects.   Please note  You were cared for by a hospitalist during your hospital stay. If you have any questions about your discharge medications or the care you received while you were in the hospital after you are discharged, you can call the unit and asked to speak with the hospitalist on call if the hospitalist that took care of you is not  available. Once you are discharged, your primary care physician will handle any further medical issues. Please note that NO REFILLS for any discharge medications will be authorized once you are discharged, as it is imperative that you return to your primary care physician (or establish a relationship with a primary care physician if you do not have one) for your aftercare needs so that they can reassess your need for medications and monitor your lab values.    On the day of Discharge:  VITAL SIGNS:  Blood pressure (!) 145/76, pulse (!) 52, temperature 98.3 F (36.8 C), temperature source Oral, resp. rate 15, height 5\' 3"  (1.6 m), weight 159 lb (72.1 kg), SpO2 96 %. PHYSICAL EXAMINATION:  GENERAL:  65 y.o.-year-old patient lying in the bed with no acute distress.  EYES: Pupils equal, round, reactive to light and accommodation. No scleral icterus. Extraocular muscles intact.  HEENT: Head atraumatic, normocephalic. Oropharynx and nasopharynx clear.  NECK:  Supple, no jugular venous distention. No thyroid enlargement, no tenderness.  LUNGS: Normal breath sounds bilaterally, no wheezing, rales,rhonchi or crepitation. No use of accessory muscles of respiration.  CARDIOVASCULAR: S1, S2 normal. No murmurs, rubs, or gallops.  ABDOMEN: Soft, non-tender, non-distended. Bowel sounds present. No organomegaly or mass.  EXTREMITIES: No pedal edema, cyanosis, or clubbing.  NEUROLOGIC: Cranial nerves II through XII are intact. Muscle strength 5/5 in all extremities. Sensation intact. Gait not checked.  PSYCHIATRIC: The patient is alert and oriented x 3.  SKIN: No obvious rash, lesion, or ulcer.  DATA REVIEW:   CBC  Recent Labs Lab 07/14/16 0416 07/15/16 0042  WBC 4.5  --   HGB 12.2 13.2  HCT 35.1  --   PLT 205 234    Chemistries   Recent Labs Lab 07/14/16 0416  NA 140  K 4.0  CL 114*  CO2 22  GLUCOSE 89  BUN 11  CREATININE 0.72  CALCIUM 8.1*  AST 24  ALT 21  ALKPHOS 44  BILITOT 0.4       Microbiology Results  Results for orders placed or performed during the hospital encounter of 12/25/14  Wet prep, genital     Status: Abnormal   Collection Time: 12/25/14  9:53 AM  Result Value Ref Range Status   Yeast Wet Prep HPF POC NONE SEEN NONE SEEN Final   Trich, Wet Prep NONE SEEN NONE SEEN Final   Clue Cells Wet Prep HPF POC NONE SEEN NONE SEEN Final   WBC, Wet Prep HPF POC MODERATE (A) NONE SEEN Final    RADIOLOGY:  No results found.   Management plans discussed with the patient, family and they are in agreement.  CODE STATUS:     Code Status Orders        Start     Ordered   07/13/16 2111  Full code  Continuous     07/13/16 2110    Code Status History    Date Active Date Inactive Code Status Order ID Comments User Context  This patient has a current code status but no historical code status.      TOTAL TIME TAKING CARE OF THIS PATIENT: 25 minutes.    Demetrios Loll M.D on 07/15/2016 at 2:16 PM  Between 7am to 6pm - Pager - [phone]  After 6pm go to [email or web address] - Proofreader  [facility]  Office  [phone]  CC: Primary care physician; PROVIDER NOT IN SYSTEM   Note: This dictation was prepared with Dragon dictation along with smaller phrase technology. Any transcriptional errors that result from this process are unintentional.

## 2016-07-21 ENCOUNTER — Emergency Department
Admission: EM | Admit: 2016-07-21 | Discharge: 2016-07-21 | Disposition: A | Discharge status: Home / Self Care | Payer: Self-pay | Attending: Emergency Medicine | Admitting: Emergency Medicine

## 2016-07-21 ENCOUNTER — Encounter: Payer: Self-pay | Admitting: Emergency Medicine

## 2016-07-21 DIAGNOSIS — I10 Essential (primary) hypertension: Secondary | ICD-10-CM | POA: Insufficient documentation

## 2016-07-21 DIAGNOSIS — Z87891 Personal history of nicotine dependence: Secondary | ICD-10-CM | POA: Insufficient documentation

## 2016-07-21 DIAGNOSIS — R220 Localized swelling, mass and lump, head: Secondary | ICD-10-CM

## 2016-07-21 DIAGNOSIS — Z79899 Other long term (current) drug therapy: Secondary | ICD-10-CM | POA: Insufficient documentation

## 2016-07-21 DIAGNOSIS — Z7982 Long term (current) use of aspirin: Secondary | ICD-10-CM | POA: Insufficient documentation

## 2016-07-21 DIAGNOSIS — J449 Chronic obstructive pulmonary disease, unspecified: Secondary | ICD-10-CM | POA: Insufficient documentation

## 2016-07-21 DIAGNOSIS — K047 Periapical abscess without sinus: Secondary | ICD-10-CM | POA: Insufficient documentation

## 2016-07-21 DIAGNOSIS — K0381 Cracked tooth: Secondary | ICD-10-CM | POA: Insufficient documentation

## 2016-07-21 DIAGNOSIS — J45909 Unspecified asthma, uncomplicated: Secondary | ICD-10-CM | POA: Insufficient documentation

## 2016-07-21 LAB — BASIC METABOLIC PANEL
Anion gap: 6 (ref 5–15)
BUN: 11 mg/dL (ref 6–20)
CHLORIDE: 103 mmol/L (ref 101–111)
CO2: 29 mmol/L (ref 22–32)
Calcium: 9.2 mg/dL (ref 8.9–10.3)
Creatinine, Ser: 0.75 mg/dL (ref 0.44–1.00)
GFR calc non Af Amer: >60 mL/min (ref >60)
Glucose, Bld: 100 mg/dL — ABNORMAL HIGH (ref 65–99)
POTASSIUM: 4.4 mmol/L (ref 3.5–5.1)
SODIUM: 138 mmol/L (ref 135–145)

## 2016-07-21 LAB — URINALYSIS, COMPLETE (UACMP) WITH MICROSCOPIC
BACTERIA UA: NONE SEEN
BILIRUBIN URINE: NEGATIVE
Glucose, UA: NEGATIVE mg/dL
HGB URINE DIPSTICK: NEGATIVE
Ketones, ur: 80 mg/dL — AB
LEUKOCYTES UA: NEGATIVE
Nitrite: NEGATIVE
Protein, ur: NEGATIVE mg/dL
SPECIFIC GRAVITY, URINE: 1.019 (ref 1.005–1.030)
pH: 5 (ref 5.0–8.0)

## 2016-07-21 LAB — CBC
HEMATOCRIT: 40.3 % (ref 35.0–47.0)
HEMOGLOBIN: 14 g/dL (ref 12.0–16.0)
MCH: 29.4 pg (ref 26.0–34.0)
MCHC: 34.6 g/dL (ref 32.0–36.0)
MCV: 84.7 fL (ref 80.0–100.0)
PLATELETS: 251 10*3/uL (ref 150–440)
RBC: 4.76 MIL/uL (ref 3.80–5.20)
RDW: 12.9 % (ref 11.5–14.5)
WBC: 9.5 10*3/uL (ref 3.6–11.0)

## 2016-07-21 LAB — TROPONIN I

## 2016-07-21 MED ORDER — OXYCODONE-ACETAMINOPHEN 5-325 MG PO TABS
1.0000 | ORAL_TABLET | Freq: Once | ORAL | Status: AC
  Administered 2016-07-21: 1 via ORAL
  Filled 2016-07-21: qty 1

## 2016-07-21 MED ORDER — KETOROLAC TROMETHAMINE 10 MG PO TABS: 10.0000 mg | ORAL_TABLET | Freq: Three times a day (TID) | ORAL | 0 refills | Status: DC | PRN

## 2016-07-21 MED ORDER — PENICILLIN V POTASSIUM 250 MG PO TABS
500.0000 mg | ORAL_TABLET | Freq: Once | ORAL | Status: AC
  Administered 2016-07-21: 500 mg via ORAL
  Filled 2016-07-21 (×2): qty 2

## 2016-07-21 MED ORDER — PENICILLIN V POTASSIUM 500 MG PO TABS: 500.0000 mg | ORAL_TABLET | Freq: Four times a day (QID) | ORAL | 0 refills | Status: DC

## 2016-07-21 MED ORDER — OXYCODONE-ACETAMINOPHEN 5-325 MG PO TABS: 1.0000 | ORAL_TABLET | ORAL | 0 refills | Status: AC | PRN

## 2016-07-21 NOTE — ED Notes (Signed)

## 2016-07-21 NOTE — Discharge Instructions (Signed)
Please drink plenty of fluids stay well-hydrated. You may take Tylenol or Toradol for mild to moderate pain. Please take Toradol with food, and do not take any other NSAID medications including Aleve, Motrin, ibuprofen, or Advil when you're taking this medication. You may take Percocet for severe pain. Do not drive within 8 hours of taking Percocet.  Take the entire course of antibiotics, even if you're feeling better. Please make an appointment with a dentist as soon as possible.  You may ice the side of your face for 10 minutes every 2 hours to decrease swelling.  Return to the emergency department if you develop severe pain, fever, nausea or vomiting, or any symptoms concerning to you.

## 2016-07-21 NOTE — ED Triage Notes (Signed)
Pt presents to ED with c/o dental pain R upper and lower jaw. Pt states she has several "broken teeth" and was not able to get in to see an emergency dentist. Pt states had heart cath on Monday 12/4, pt states her BP has been fluctuating low and high at home. Pt c/o light headedness and weakness that appears to fluctuate with her blood pressure. Pt is alert and oriented at this time.

## 2016-07-21 NOTE — ED Provider Notes (Signed)
[facility] Emergency Department Provider Note  ____________________________________________  Time seen: Approximately 4:10 PM  I have reviewed the triage vital signs and the nursing notes.   HISTORY  Chief Complaint Dental Pain and Weakness    HPI Andrea Ford is a 65 y.o. female with a history of poor dentition and currently without dental insurance presenting for right upper and lower tooth pain with associated facial swelling. The patient reports that for the past several weeks, she has had a pain associated with multiple cracked teeth in the upper and lower jaws on the right. Over the past several days, she has developed associated facial swelling. She uses ice, which helps. She has not had any systemic symptoms including fever, chills, nausea or vomiting.   Past Medical History:  Diagnosis Date  . Asthma   . COPD (chronic obstructive pulmonary disease) ([place])   . Hypertension   . Migraine     Patient Active Problem List   Diagnosis Date Noted  . Accelerated hypertension 07/13/2016  . Chest pain 07/13/2016  . Abnormal EKG 07/13/2016  . GAD (generalized anxiety disorder) 07/13/2016    Past Surgical History:  Procedure Laterality Date  . ANKLE SURGERY Right   . CARDIAC CATHETERIZATION Right 07/15/2016   Procedure: Left Heart Cath and Coronary Angiography;  Surgeon: Dionisio David, MD;  Location: [facility];  Service: Cardiovascular;  Laterality: Right;  . CHOLECYSTECTOMY    . TUBAL LIGATION      Current Outpatient Rx  . Order #: [id number] Class: Historical Med  . Order #: [id number] Class: Print  . Order #: [id number] Class: Print  . Order #: [id number] Class: Historical Med  . Order #: [id number] Class: Historical Med  . Order #: [id number] Class: Print  . Order #: [id number] Class: Historical Med  . Order #: [id number] Class: Historical Med  . Order #: [id number] Class: Print  . Order #: [id number] Class: Historical Med  . Order #:  [id number] Class: Print  . Order #: [id number] Class: Print  . Order #: [id number] Class: Print  . Order #: [id number] Class: Print  . Order #: [id number] Class: Print    Allergies Other; Codeine; Diphenhydramine; Etodolac; Meperidine; Prednisone; and Venlafaxine  History reviewed. No pertinent family history.  Social History Social History  Substance Use Topics  . Smoking status: Former Research scientist (life sciences)  . Smokeless tobacco: Never Used  . Alcohol use No    Review of Systems Constitutional: No fever/chills.No lightheadedness or syncope. Eyes: No visual changes. No pain with extraocular movements. ENT: No sore throat. No congestion or rhinorrhea. Positive dental pain. Positive right facial swelling. Cardiovascular: Denies chest pain. Denies palpitations. Respiratory: Denies shortness of breath.  No cough. Gastrointestinal: No abdominal pain.  No nausea, no vomiting.  No diarrhea.  No constipation. Musculoskeletal: Negative for neck pain or stiffness.. Skin: Negative for rash. Neurological: Negative for headaches. No focal numbness, tingling or weakness.   10-point ROS otherwise negative.  ____________________________________________   PHYSICAL EXAM:  VITAL SIGNS: ED Triage Vitals  Enc Vitals Group     BP 07/21/16 1250 (!) 157/98     Pulse Rate 07/21/16 1250 95     Resp 07/21/16 1250 18     Temp 07/21/16 1250 99.3 F (37.4 C)     Temp Source 07/21/16 1250 Oral     SpO2 07/21/16 1250 98 %     Weight 07/21/16 1251 167 lb (75.8 kg)     Height 07/21/16 1251 5\' 3"  (1.6 m)     Head Circumference --      Peak Flow --  Pain Score 07/21/16 1251 10     Pain Loc --      Pain Edu? --      Excl. in [place]? --     Constitutional: Alert and oriented. Well appearing and in no acute distress. Answers questions appropriately. Eyes: Conjunctivae are normal.  EOMI. No scleral icterus. EARS: TM on the right is clear without fluid, erythema or bulge. The canal is clear as well. Head: Mild  right-sided facial swelling with lymphadenopathy on the right in the submandibular space. No evidence of Ludwigs angina or mastoiditis. Nose: No congestion/rhinnorhea. Mouth/Throat: Mucous membranes are moist. No trismus. Diffuse poor dentition. Multiple cracked teeth, notably in the right upper and right lower jaw with tenderness on palpation but no discrete abscess. No halitosis. No drooling. No voice change. Neck: No stridor.  Supple.  No JVD. No meningismus. Cardiovascular: Normal rate Respiratory: Normal respiratory effort. Musculoskeletal: No LE edema.  Neurologic:  A&Ox3.  Speech is clear.  Face and smile are symmetric.  EOMI.  Moves all extremities well. Skin:  Skin is warm, dry and intact. No rash noted. Psychiatric: Mood and affect are normal. Speech and behavior are normal.  Normal judgement.  ____________________________________________   LABS (all labs ordered are listed, but only abnormal results are displayed)  Labs Reviewed  BASIC METABOLIC PANEL - Abnormal; Notable for the following:       Result Value   Glucose, Bld 100 (*)    All other components within normal limits  URINALYSIS, COMPLETE (UACMP) WITH MICROSCOPIC - Abnormal; Notable for the following:    Color, Urine YELLOW (*)    APPearance CLEAR (*)    Ketones, ur 80 (*)    Squamous Epithelial / LPF 0-5 (*)    All other components within normal limits  CBC  TROPONIN I   ____________________________________________  EKG  ED ECG REPORT I, Eula Listen, the attending physician, personally viewed and interpreted this ECG.   Date: 07/21/2016  EKG Time: 1259  Rate: 89  Rhythm: normal sinus rhythm  Axis: normal  Intervals:none  ST&T Change: Nonspecific T-wave inversion in V1. No ST elevation.  ____________________________________________  RADIOLOGY  No results found.  ____________________________________________   PROCEDURES  Procedure(s) performed: None  Procedures  Critical Care  performed: No ____________________________________________   INITIAL IMPRESSION / ASSESSMENT AND PLAN / ED COURSE  Pertinent labs & imaging results that were available during my care of the patient were reviewed by me and considered in my medical decision making (see chart for details).  65 y.o. email with a history of poor dentition presenting with several weeks of progressive worsening right-sided dental pain, now with associated facial swelling but no evidence of systemic infection, very unlikely discrete abscess, no evidence of meningitis, and no evidence of airway compromise. I will initiate the patient on penicillin, and give her treatment of her pain. I have offered her a dental block, and she has refused it. She will follow-up with her dentist. She understands return precautions.  ____________________________________________  FINAL CLINICAL IMPRESSION(S) / ED DIAGNOSES  Final diagnoses:  Dental infection  Facial swelling    Clinical Course       NEW MEDICATIONS STARTED DURING THIS VISIT:  New Prescriptions   KETOROLAC (TORADOL) 10 MG TABLET    Take 1 tablet (10 mg total) by mouth every 8 (eight) hours as needed for moderate pain (with food).   OXYCODONE-ACETAMINOPHEN (ROXICET) 5-325 MG TABLET    Take 1-2 tablets by mouth every 4 (four) hours as needed.  PENICILLIN V POTASSIUM (VEETID) 500 MG TABLET    Take 1 tablet (500 mg total) by mouth 4 (four) times daily.      Eula Listen, MD 07/21/16 1616

## 2016-07-28 IMAGING — CT CT ABD-PELV W/ CM
2 of 5 series · 16 of 46 positions shown, 18 images · IV contrast (omnipaque)
Comparison: 09/06/1999 by report only

CLINICAL DATA: left upper quadrant pain and discomfort. Patient
states, "it feels like somebody's bruised me inside." Pain began
yesterday. Patient states, "I lift a lot of heavy boxes at work".
Patient reports vomiting and diarrhea that began yesterday. Patient
reports dry heaving today. Patient states diarrhea x 10 in the last
12 hours and vomiting x 2. Pt with hx of tubal ligation and
cholecystectomy. Pt positive for hematuria.

EXAM:
CT ABDOMEN AND PELVIS WITH CONTRAST
TECHNIQUE: Multidetector CT imaging of the abdomen and pelvis was performed
using the standard protocol following bolus administration of
intravenous contrast.
CONTRAST:  100 mL Omnipaque 300 IV

[Series 2: routine abd pel with · axial · 0.66mm/px · z∈[-974,-589]mm · 13 of 87 slices shown, 15 images]
[im 5/87  soft-tissue]
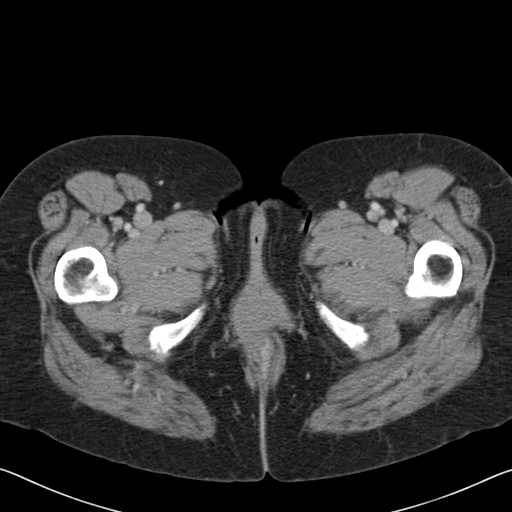
[im 5/87  bone]
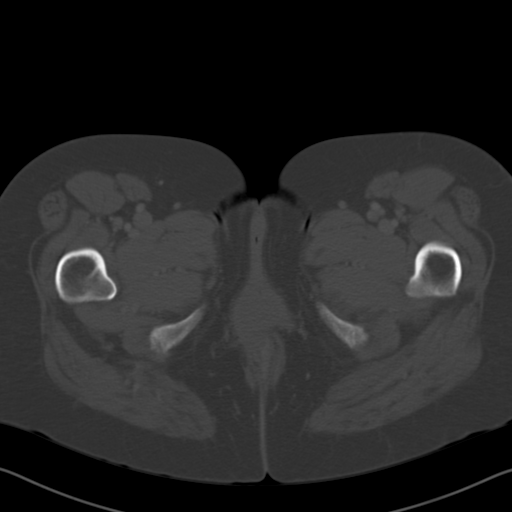
[im 13/87  soft-tissue]
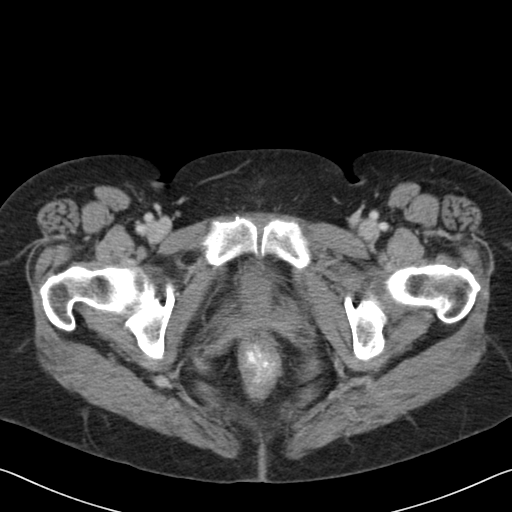
[im 18/87  soft-tissue]
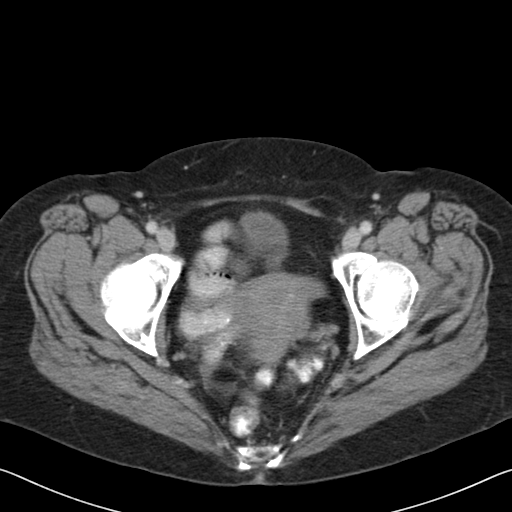
[im 26/87  soft-tissue]
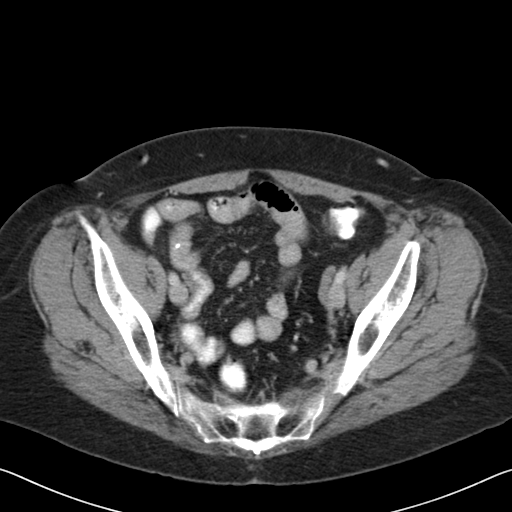
[im 31/87  soft-tissue]
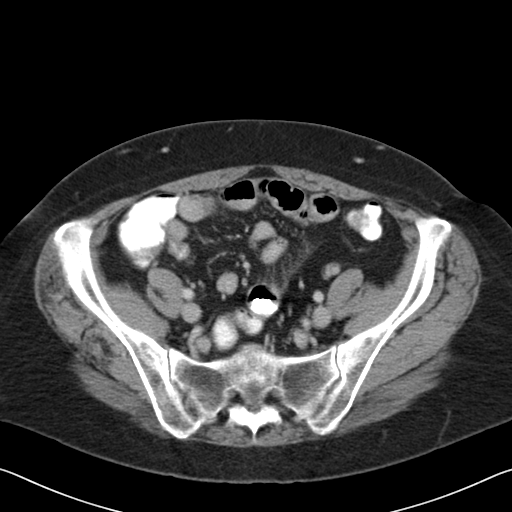
[im 39/87  soft-tissue]
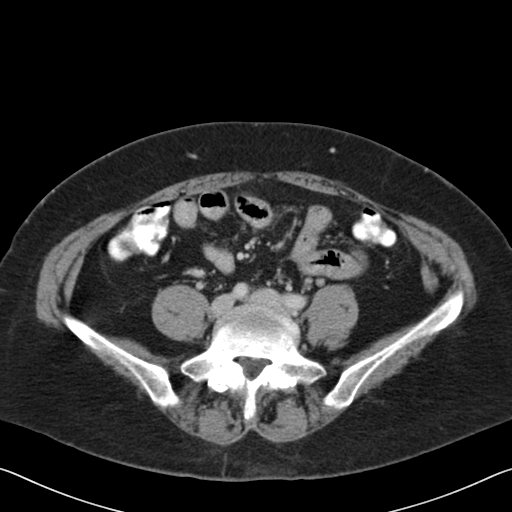
[im 44/87  soft-tissue]
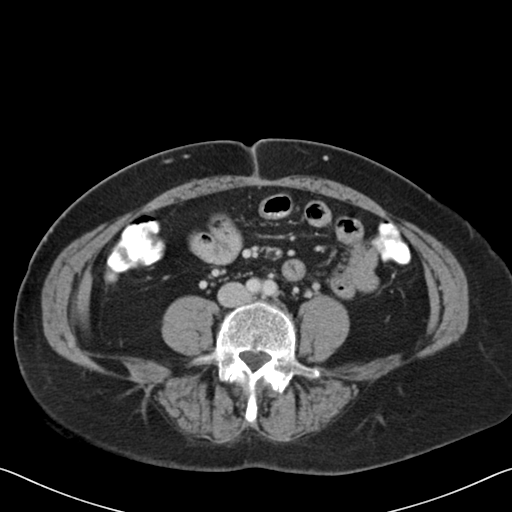
[im 48/87  soft-tissue]
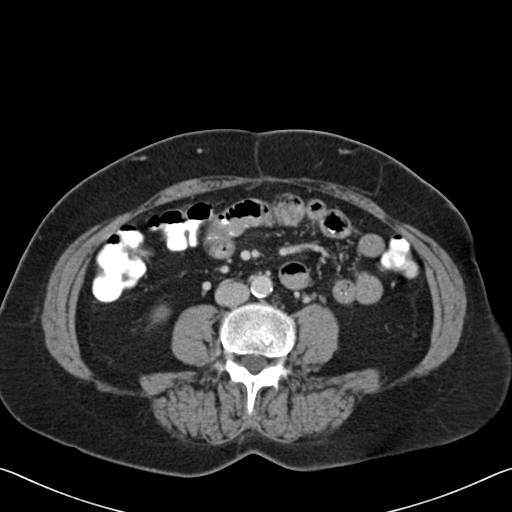
[im 56/87  soft-tissue]
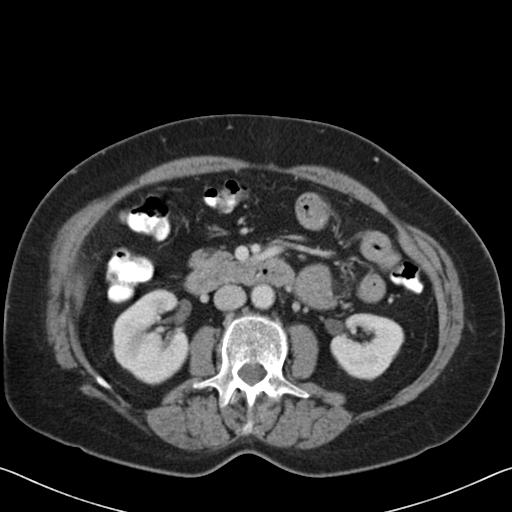
[im 56/87  bone]
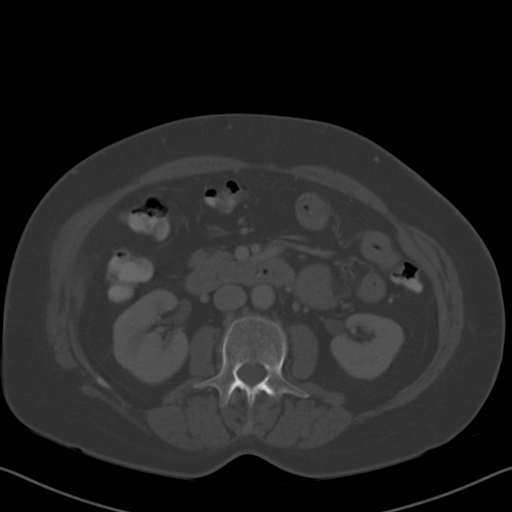
[im 61/87  soft-tissue]
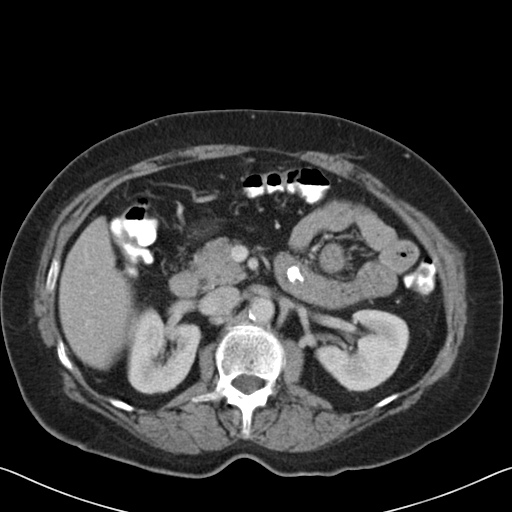
[im 69/87  soft-tissue]
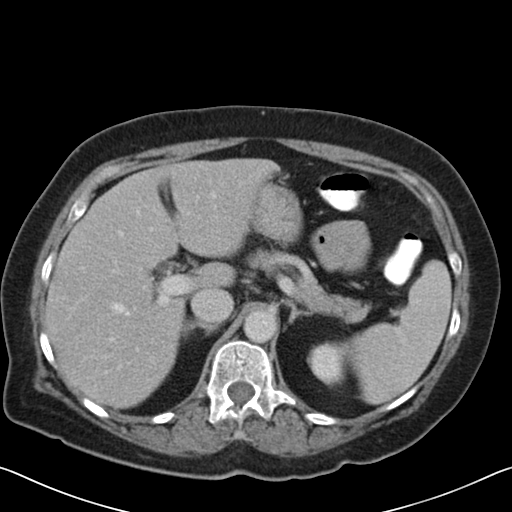
[im 74/87  soft-tissue]
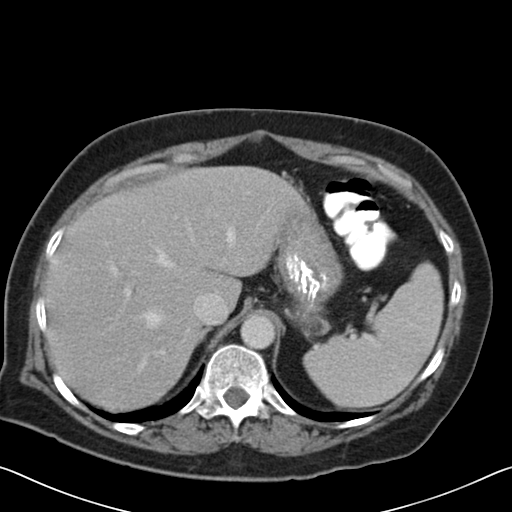
[im 82/87  soft-tissue]
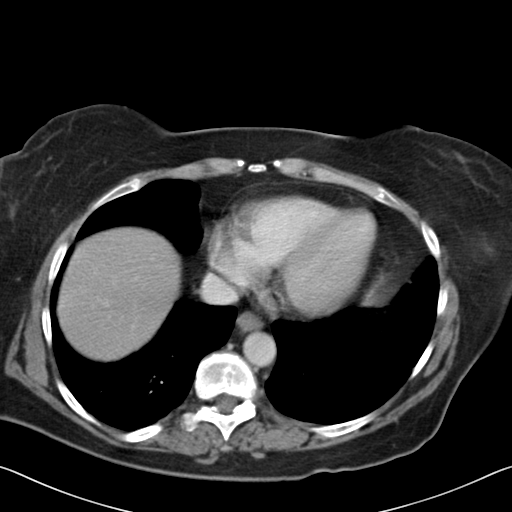

[Series 5: cor routine abd pel with · coronal · 0.64mm/px · 3 of 119 slices shown]
[im 40/119  soft-tissue]
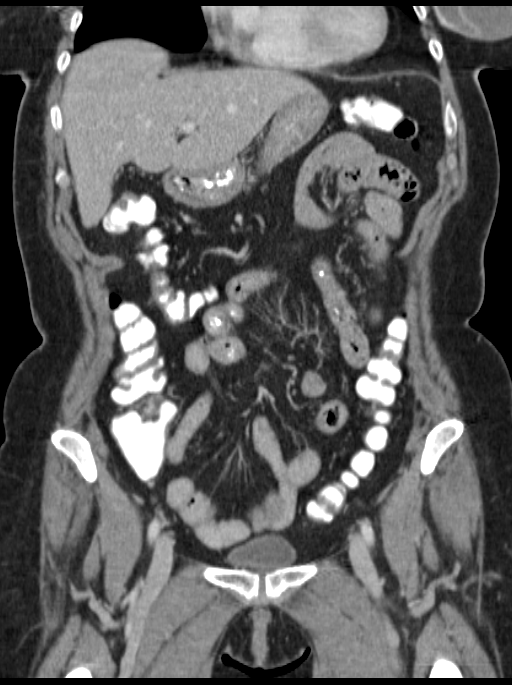
[im 53/119  soft-tissue]
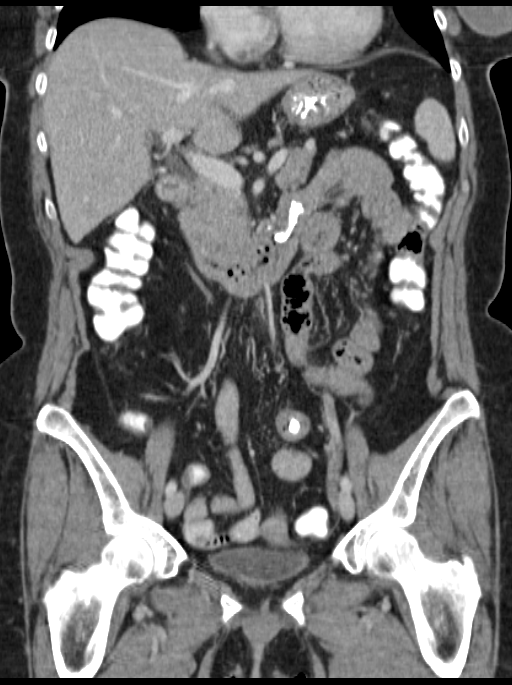
[im 66/119  soft-tissue]
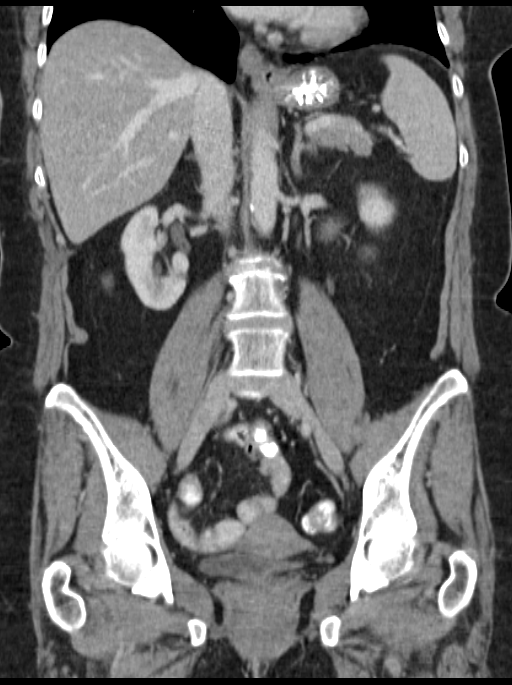

[16 of 46 positions shown; findings below may reference images not displayed]

FINDINGS: Minimal dependent atelectasis in the visualized lung bases right
greater than left. Bilateral breast implants with peripheral
calcifications partially visualized. Unremarkable liver, spleen,
adrenal glands, kidneys, pancreas. Gallbladder surgically absent.
Patchy aortic calcified plaque without aneurysm or stenosis. Portal
vein patent. Stomach, small bowel, and colon are nondilated.
Appendix not discretely identified. No pericecal inflammatory/
edematous change. Urinary bladder incompletely distended. Uterus and
adnexal regions unremarkable. No ascites. No free air. No adenopathy
identified. Mild spondylitic changes in the lumbar spine. No
hydronephrosis.
IMPRESSION: 1. No acute abdominal process.

## 2016-08-13 DIAGNOSIS — F332 Major depressive disorder, recurrent severe without psychotic features: Secondary | ICD-10-CM | POA: Insufficient documentation

## 2016-11-24 ENCOUNTER — Emergency Department
Admission: EM | Admit: 2016-11-24 | Discharge: 2016-11-24 | Disposition: A | Discharge status: Home / Self Care | Payer: BLUE CROSS/BLUE SHIELD | Attending: Emergency Medicine | Admitting: Emergency Medicine

## 2016-11-24 ENCOUNTER — Encounter: Payer: Self-pay | Admitting: Emergency Medicine

## 2016-11-24 DIAGNOSIS — J45909 Unspecified asthma, uncomplicated: Secondary | ICD-10-CM | POA: Insufficient documentation

## 2016-11-24 DIAGNOSIS — R110 Nausea: Secondary | ICD-10-CM | POA: Insufficient documentation

## 2016-11-24 DIAGNOSIS — R1012 Left upper quadrant pain: Secondary | ICD-10-CM | POA: Diagnosis not present

## 2016-11-24 DIAGNOSIS — R1013 Epigastric pain: Secondary | ICD-10-CM | POA: Insufficient documentation

## 2016-11-24 DIAGNOSIS — J449 Chronic obstructive pulmonary disease, unspecified: Secondary | ICD-10-CM | POA: Diagnosis not present

## 2016-11-24 DIAGNOSIS — Z79899 Other long term (current) drug therapy: Secondary | ICD-10-CM | POA: Diagnosis not present

## 2016-11-24 DIAGNOSIS — Z7982 Long term (current) use of aspirin: Secondary | ICD-10-CM | POA: Insufficient documentation

## 2016-11-24 DIAGNOSIS — Z87891 Personal history of nicotine dependence: Secondary | ICD-10-CM | POA: Insufficient documentation

## 2016-11-24 DIAGNOSIS — I10 Essential (primary) hypertension: Secondary | ICD-10-CM | POA: Diagnosis not present

## 2016-11-24 LAB — COMPREHENSIVE METABOLIC PANEL
ALK PHOS: 67 U/L (ref 38–126)
ALT: 27 U/L (ref 14–54)
AST: 29 U/L (ref 15–41)
Albumin: 4.2 g/dL (ref 3.5–5.0)
Anion gap: 8 (ref 5–15)
BILIRUBIN TOTAL: 0.7 mg/dL (ref 0.3–1.2)
BUN: 14 mg/dL (ref 6–20)
CALCIUM: 9.1 mg/dL (ref 8.9–10.3)
CO2: 25 mmol/L (ref 22–32)
CREATININE: 0.64 mg/dL (ref 0.44–1.00)
Chloride: 104 mmol/L (ref 101–111)
Glucose, Bld: 100 mg/dL — ABNORMAL HIGH (ref 65–99)
Potassium: 4 mmol/L (ref 3.5–5.1)
SODIUM: 137 mmol/L (ref 135–145)
TOTAL PROTEIN: 7.7 g/dL (ref 6.5–8.1)

## 2016-11-24 LAB — URINALYSIS, COMPLETE (UACMP) WITH MICROSCOPIC
BILIRUBIN URINE: NEGATIVE
Bacteria, UA: NONE SEEN
GLUCOSE, UA: NEGATIVE mg/dL
KETONES UR: NEGATIVE mg/dL
Leukocytes, UA: NEGATIVE
NITRITE: NEGATIVE
Protein, ur: NEGATIVE mg/dL
Specific Gravity, Urine: 1.003 — ABNORMAL LOW (ref 1.005–1.030)
pH: 6 (ref 5.0–8.0)

## 2016-11-24 LAB — CBC
HCT: 40.9 % (ref 35.0–47.0)
Hemoglobin: 14.2 g/dL (ref 12.0–16.0)
MCH: 29.7 pg (ref 26.0–34.0)
MCHC: 34.7 g/dL (ref 32.0–36.0)
MCV: 85.6 fL (ref 80.0–100.0)
PLATELETS: 291 10*3/uL (ref 150–440)
RBC: 4.78 MIL/uL (ref 3.80–5.20)
RDW: 12.8 % (ref 11.5–14.5)
WBC: 6.7 10*3/uL (ref 3.6–11.0)

## 2016-11-24 LAB — LIPASE, BLOOD: Lipase: 26 U/L (ref 11–51)

## 2016-11-24 MED ORDER — FAMOTIDINE 40 MG PO TABS: 40.0000 mg | ORAL_TABLET | Freq: Every evening | ORAL | 1 refills | Status: DC

## 2016-11-24 MED ORDER — GI COCKTAIL ~~LOC~~
30.0000 mL | Freq: Once | ORAL | Status: AC
  Administered 2016-11-24: 30 mL via ORAL
  Filled 2016-11-24: qty 30

## 2016-11-24 NOTE — ED Triage Notes (Signed)
Pt presents via POV s/o LUQ pain extending through left flank into back, Reports history of similar pain and thought to have been pulled muscle. Reports some nausea.

## 2016-11-24 NOTE — ED Provider Notes (Signed)
[facility] Emergency Department Provider Note   ____________________________________________   I have reviewed the triage vital signs and the nursing notes.   HISTORY  Chief Complaint Abdominal Pain and Flank Pain   History limited by: Not Limited   HPI Andrea Ford is a 66 y.o. female who presents to the emergency department today is of concerns for left upper quadrant abdominal pain. The pain started this morning. She describes it as the feeling of a deep bruise. It radiates to her back. She has some nausea with the pain but no vomiting. No shortness of breath or cough. No fevers. Patient has had similar pain in the past. Occurs occasionally without any obvious cause.    Past Medical History:  Diagnosis Date  . Asthma   . COPD (chronic obstructive pulmonary disease) ([place])   . Hypertension   . Migraine     Patient Active Problem List   Diagnosis Date Noted  . Accelerated hypertension 07/13/2016  . Chest pain 07/13/2016  . Abnormal EKG 07/13/2016  . GAD (generalized anxiety disorder) 07/13/2016    Past Surgical History:  Procedure Laterality Date  . ANKLE SURGERY Right   . CARDIAC CATHETERIZATION Right 07/15/2016   Procedure: Left Heart Cath and Coronary Angiography;  Surgeon: Dionisio David, MD;  Location: [place] [facility];  Service: Cardiovascular;  Laterality: Right;  . CHOLECYSTECTOMY    . TUBAL LIGATION      Prior to Admission medications   Medication Sig Start Date End Date Taking? Authorizing Provider  albuterol (PROVENTIL HFA;VENTOLIN HFA) 108 (90 BASE) MCG/ACT inhaler Inhale into the lungs every 6 (six) hours as needed for wheezing or shortness of breath.    Historical Provider, MD  aspirin 81 MG chewable tablet Chew 1 tablet (81 mg total) by mouth daily. 07/15/16   Demetrios Loll, MD  atorvastatin (LIPITOR) 40 MG tablet Take 1 tablet (40 mg total) by mouth daily at 6 PM. 07/15/16   Demetrios Loll, MD  busPIRone (BUSPAR) 7.5 MG tablet Take  7.5 mg by mouth 2 (two) times daily. 05/02/16 05/02/17  Historical Provider, MD  Cholecalciferol (VITAMIN D-3 PO) Take 1 tablet by mouth daily.    Historical Provider, MD  ciprofloxacin (CILOXAN) 0.3 % ophthalmic solution Place 2 drops into the right ear every 4 (four) hours while awake. 05/19/16   Victorino Dike, FNP  citalopram (CELEXA) 40 MG tablet Take 40 mg by mouth daily. 05/02/16   Historical Provider, MD  hydrOXYzine (ATARAX/VISTARIL) 25 MG tablet Take 25 mg by mouth daily as needed. 05/02/16   Historical Provider, MD  ketorolac (TORADOL) 10 MG tablet Take 1 tablet (10 mg total) by mouth every 8 (eight) hours as needed for moderate pain (with food). 07/21/16   Anne-Caroline Mariea Clonts, MD  losartan (COZAAR) 100 MG tablet Take 100 mg by mouth daily. 02/01/16 01/31/17  Historical Provider, MD  metoprolol tartrate (LOPRESSOR) 25 MG tablet Take 1 tablet (25 mg total) by mouth 2 (two) times daily. 07/14/16   Demetrios Loll, MD  nitroGLYCERIN (NITROSTAT) 0.4 MG SL tablet Place 1 tablet (0.4 mg total) under the tongue every 5 (five) minutes as needed for chest pain. 07/14/16   Demetrios Loll, MD  ondansetron (ZOFRAN ODT) 4 MG disintegrating tablet Take 1 tablet (4 mg total) by mouth every 8 (eight) hours as needed for nausea or vomiting. 04/17/16   Carrie Mew, MD  oxyCODONE-acetaminophen (ROXICET) 5-325 MG tablet Take 1-2 tablets by mouth every 4 (four) hours as needed. 07/21/16 07/21/17  Eula Listen, MD  penicillin v potassium (VEETID) 500 MG tablet Take 1 tablet (500 mg total) by mouth 4 (four) times daily. 07/21/16   Eula Listen, MD    Allergies Other; Codeine; Diphenhydramine; Etodolac; Meperidine; Prednisone; and Venlafaxine  History reviewed. No pertinent family history.  Social History Social History  Substance Use Topics  . Smoking status: Former Research scientist (life sciences)  . Smokeless tobacco: Never Used  . Alcohol use No    Review of Systems  Constitutional: Negative for fever. Cardiovascular:  Negative for chest pain. Respiratory: Negative for shortness of breath. Gastrointestinal: Positive for abdominal pain.  Genitourinary: Negative for dysuria. Musculoskeletal: Negative for back pain. Skin: Negative for rash. Neurological: Negative for headaches, focal weakness or numbness.  10-point ROS otherwise negative.  ____________________________________________   PHYSICAL EXAM:  VITAL SIGNS: ED Triage Vitals  Enc Vitals Group     BP 11/24/16 1059 (!) 146/98     Pulse Rate 11/24/16 1059 83     Resp 11/24/16 1059 14     Temp 11/24/16 1059 98.5 F (36.9 C)     Temp Source 11/24/16 1059 Oral     SpO2 11/24/16 1059 97 %     Weight 11/24/16 1100 171 lb (77.6 kg)     Height 11/24/16 1100 5\' 3"  (1.6 m)     Head Circumference --      Peak Flow --      Pain Score 11/24/16 1059 7   Constitutional: Alert and oriented. Well appearing and in no distress. Eyes: Conjunctivae are normal. Normal extraocular movements. ENT   Head: Normocephalic and atraumatic.   Nose: No congestion/rhinnorhea.   Mouth/Throat: Mucous membranes are moist.   Neck: No stridor. Hematological/Lymphatic/Immunilogical: No cervical lymphadenopathy. Cardiovascular: Normal rate, regular rhythm.  No murmurs, rubs, or gallops.  Respiratory: Normal respiratory effort without tachypnea nor retractions. Breath sounds are clear and equal bilaterally. No wheezes/rales/rhonchi. Gastrointestinal: Soft and tender in the left upper quadrant and epigastric region. No rebound. No guarding.  Genitourinary: Deferred Musculoskeletal: Normal range of motion in all extremities. No lower extremity edema. Neurologic:  Normal speech and language. No gross focal neurologic deficits are appreciated.  Skin:  Skin is warm, dry and intact. No rash noted. Psychiatric: Mood and affect are normal. Speech and behavior are normal. Patient exhibits appropriate insight and judgment.  ____________________________________________     LABS (pertinent positives/negatives)  Labs Reviewed  COMPREHENSIVE METABOLIC PANEL - Abnormal; Notable for the following:       Result Value   Glucose, Bld 100 (*)    All other components within normal limits  URINALYSIS, COMPLETE (UACMP) WITH MICROSCOPIC - Abnormal; Notable for the following:    Color, Urine STRAW (*)    APPearance CLEAR (*)    Specific Gravity, Urine 1.003 (*)    Hgb urine dipstick SMALL (*)    Squamous Epithelial / LPF 0-5 (*)    All other components within normal limits  LIPASE, BLOOD  CBC     ____________________________________________   EKG  I, Nance Pear, attending physician, personally viewed and interpreted this EKG  EKG Time: 1219 Rate: 68 Rhythm: sinus rhythm Axis: normal Intervals: qtc 422 QRS: narrow ST changes: no st elevation Impression: normal ekg   ____________________________________________    RADIOLOGY  None   ____________________________________________   PROCEDURES  Procedures  ____________________________________________   INITIAL IMPRESSION / ASSESSMENT AND PLAN / ED COURSE  Pertinent labs & imaging results that were available during my care of the patient were reviewed by me and considered  in my medical decision making (see chart for details).  Patient presented to the emergency department today with concerns for left upper quadrant abdominal pain. On exam patient is mildly tender in the left upper quadrant and epigastric region. Lab work however without any concerning leukocytosis elevation of lipase. Patient was given a GI cocktail stated the pain felt sharp. She does have a history of acid reflux in the past however is currently not taking any treatment for. This point feel patient is safe for discharge. Patient has follow-up appointment scheduled later this month with primary care.  Will give patient trial of antacids.  ____________________________________________   FINAL CLINICAL IMPRESSION(S) /  ED DIAGNOSES  Final diagnoses:  Left upper quadrant pain     Note: This dictation was prepared with Dragon dictation. Any transcriptional errors that result from this process are unintentional     Nance Pear, MD 11/24/16 1244

## 2016-11-24 NOTE — Discharge Instructions (Signed)
Please seek medical attention for any high fevers, chest pain, shortness of breath, change in behavior, persistent vomiting, bloody stool or any other new or concerning symptoms.  

## 2017-01-07 ENCOUNTER — Encounter: Payer: Self-pay | Admitting: Emergency Medicine

## 2017-01-07 ENCOUNTER — Emergency Department
Admission: EM | Admit: 2017-01-07 | Discharge: 2017-01-07 | Disposition: A | Discharge status: Home / Self Care | Payer: BLUE CROSS/BLUE SHIELD | Attending: Student in an Organized Health Care Education/Training Program | Admitting: Student in an Organized Health Care Education/Training Program

## 2017-01-07 DIAGNOSIS — J45909 Unspecified asthma, uncomplicated: Secondary | ICD-10-CM | POA: Diagnosis not present

## 2017-01-07 DIAGNOSIS — R42 Dizziness and giddiness: Secondary | ICD-10-CM

## 2017-01-07 DIAGNOSIS — Z87891 Personal history of nicotine dependence: Secondary | ICD-10-CM | POA: Diagnosis not present

## 2017-01-07 DIAGNOSIS — Z79899 Other long term (current) drug therapy: Secondary | ICD-10-CM | POA: Diagnosis not present

## 2017-01-07 DIAGNOSIS — R51 Headache: Secondary | ICD-10-CM | POA: Insufficient documentation

## 2017-01-07 DIAGNOSIS — J449 Chronic obstructive pulmonary disease, unspecified: Secondary | ICD-10-CM | POA: Diagnosis not present

## 2017-01-07 DIAGNOSIS — I10 Essential (primary) hypertension: Secondary | ICD-10-CM | POA: Diagnosis not present

## 2017-01-07 DIAGNOSIS — Z7982 Long term (current) use of aspirin: Secondary | ICD-10-CM | POA: Insufficient documentation

## 2017-01-07 DIAGNOSIS — R519 Headache, unspecified: Secondary | ICD-10-CM

## 2017-01-07 DIAGNOSIS — R110 Nausea: Secondary | ICD-10-CM | POA: Insufficient documentation

## 2017-01-07 LAB — BASIC METABOLIC PANEL
Anion gap: 6 (ref 5–15)
BUN: 19 mg/dL (ref 6–20)
CALCIUM: 9.3 mg/dL (ref 8.9–10.3)
CO2: 28 mmol/L (ref 22–32)
Chloride: 101 mmol/L (ref 101–111)
Creatinine, Ser: 0.8 mg/dL (ref 0.44–1.00)
GFR calc Af Amer: >60 mL/min (ref >60)
GLUCOSE: 102 mg/dL — AB (ref 65–99)
Potassium: 4.2 mmol/L (ref 3.5–5.1)
Sodium: 135 mmol/L (ref 135–145)

## 2017-01-07 LAB — URINALYSIS, COMPLETE (UACMP) WITH MICROSCOPIC
BACTERIA UA: NONE SEEN
BILIRUBIN URINE: NEGATIVE
Glucose, UA: NEGATIVE mg/dL
Hgb urine dipstick: NEGATIVE
Ketones, ur: NEGATIVE mg/dL
Leukocytes, UA: NEGATIVE
NITRITE: NEGATIVE
PH: 6 (ref 5.0–8.0)
Protein, ur: NEGATIVE mg/dL
Specific Gravity, Urine: 1.002 — ABNORMAL LOW (ref 1.005–1.030)

## 2017-01-07 LAB — CBC
HEMATOCRIT: 41.2 % (ref 35.0–47.0)
Hemoglobin: 14.5 g/dL (ref 12.0–16.0)
MCH: 30.3 pg (ref 26.0–34.0)
MCHC: 35.2 g/dL (ref 32.0–36.0)
MCV: 86.1 fL (ref 80.0–100.0)
Platelets: 286 10*3/uL (ref 150–440)
RBC: 4.79 MIL/uL (ref 3.80–5.20)
RDW: 12.9 % (ref 11.5–14.5)
WBC: 10 10*3/uL (ref 3.6–11.0)

## 2017-01-07 LAB — TROPONIN I: Troponin I: <0.03 ng/mL (ref <0.03)

## 2017-01-07 MED ORDER — DEXAMETHASONE SODIUM PHOSPHATE 10 MG/ML IJ SOLN
10.0000 mg | Freq: Once | INTRAMUSCULAR | Status: AC
  Administered 2017-01-07: 10 mg via INTRAVENOUS
  Filled 2017-01-07: qty 1

## 2017-01-07 MED ORDER — LORAZEPAM 1 MG PO TABS
ORAL_TABLET | ORAL | Status: AC
  Administered 2017-01-07: 1 mg via ORAL
  Filled 2017-01-07: qty 1

## 2017-01-07 MED ORDER — SODIUM CHLORIDE 0.9 % IV BOLUS (SEPSIS)
1000.0000 mL | Freq: Once | INTRAVENOUS | Status: AC
  Administered 2017-01-07: 1000 mL via INTRAVENOUS

## 2017-01-07 MED ORDER — MAGNESIUM SULFATE 2 GM/50ML IV SOLN
2.0000 g | Freq: Once | INTRAVENOUS | Status: AC
  Administered 2017-01-07: 2 g via INTRAVENOUS
  Filled 2017-01-07: qty 50

## 2017-01-07 MED ORDER — LORAZEPAM 1 MG PO TABS
1.0000 mg | ORAL_TABLET | Freq: Once | ORAL | Status: AC
  Administered 2017-01-07: 1 mg via ORAL

## 2017-01-07 MED ORDER — PROCHLORPERAZINE EDISYLATE 5 MG/ML IJ SOLN
10.0000 mg | Freq: Once | INTRAMUSCULAR | Status: AC
  Administered 2017-01-07: 10 mg via INTRAVENOUS
  Filled 2017-01-07: qty 2

## 2017-01-07 MED ORDER — ACETAMINOPHEN 500 MG PO TABS
1000.0000 mg | ORAL_TABLET | Freq: Once | ORAL | Status: AC
  Administered 2017-01-07: 1000 mg via ORAL
  Filled 2017-01-07: qty 2

## 2017-01-07 NOTE — ED Triage Notes (Signed)
Pt c/o headache, nausea, and feeling light headed for 2 days.  Feels like going to pass out sometimes. Ambulatory to triage without difficulty. Hx migraines. No vision changes. No facial droop. Speech clear.

## 2017-01-07 NOTE — Discharge Instructions (Signed)

## 2017-01-07 NOTE — ED Provider Notes (Signed)
[facility] Emergency Department Provider Note    First MD Initiated Contact with Patient 01/07/17 1544     (approximate)  I have reviewed the triage vital signs and the nursing notes.   HISTORY  Chief Complaint Nausea    HPI Andrea Ford is a 66 y.o. female with history of migraine headaches presents with feeling lightheaded associated with occipital headache and nausea that is been going on for 2 days. States that she's felt lightheaded enough that she feels like she is about to pass out. She denies any chest pain or shortness of breath. No loss of consciousness. No numbness or tingling. Has had symptoms similar in the past.  She is primarily concerned today because her blood pressure was elevated she thinks that this is causing her headache. States she's been out of work for over a year due to elevated blood pressure. Says that she recently had cardiac catheter that was negative with Dr. Chancy Milroy.   Past Medical History:  Diagnosis Date  . Asthma   . COPD (chronic obstructive pulmonary disease) ([place])   . Hypertension   . Migraine    History reviewed. No pertinent family history. Past Surgical History:  Procedure Laterality Date  . ANKLE SURGERY Right   . CARDIAC CATHETERIZATION Right 07/15/2016   Procedure: Left Heart Cath and Coronary Angiography;  Surgeon: Dionisio David, MD;  Location: [place] [facility];  Service: Cardiovascular;  Laterality: Right;  . CHOLECYSTECTOMY    . TUBAL LIGATION     Patient Active Problem List   Diagnosis Date Noted  . Accelerated hypertension 07/13/2016  . Chest pain 07/13/2016  . Abnormal EKG 07/13/2016  . GAD (generalized anxiety disorder) 07/13/2016      Prior to Admission medications   Medication Sig Start Date End Date Taking? Authorizing Provider  albuterol (PROVENTIL HFA;VENTOLIN HFA) 108 (90 BASE) MCG/ACT inhaler Inhale into the lungs every 6 (six) hours as needed for wheezing or shortness of breath.     [provider]  aspirin 81 MG chewable tablet Chew 1 tablet (81 mg total) by mouth daily. 07/15/16   Demetrios Loll, MD  atorvastatin (LIPITOR) 40 MG tablet Take 1 tablet (40 mg total) by mouth daily at 6 PM. 07/15/16   Demetrios Loll, MD  busPIRone (BUSPAR) 7.5 MG tablet Take 7.5 mg by mouth 2 (two) times daily. 05/02/16 05/02/17  [provider]  Cholecalciferol (VITAMIN D-3 PO) Take 1 tablet by mouth daily.    [provider]  ciprofloxacin (CILOXAN) 0.3 % ophthalmic solution Place 2 drops into the right ear every 4 (four) hours while awake. 05/19/16   Triplett, Cari B, FNP  citalopram (CELEXA) 40 MG tablet Take 40 mg by mouth daily. 05/02/16   [provider]  famotidine (PEPCID) 40 MG tablet Take 1 tablet (40 mg total) by mouth every evening. 11/24/16 11/24/17  Nance Pear, MD  hydrOXYzine (ATARAX/VISTARIL) 25 MG tablet Take 25 mg by mouth daily as needed. 05/02/16   [provider]  ketorolac (TORADOL) 10 MG tablet Take 1 tablet (10 mg total) by mouth every 8 (eight) hours as needed for moderate pain (with food). 07/21/16   Eula Listen, MD  losartan (COZAAR) 100 MG tablet Take 100 mg by mouth daily. 02/01/16 01/31/17  [provider]  metoprolol tartrate (LOPRESSOR) 25 MG tablet Take 1 tablet (25 mg total) by mouth 2 (two) times daily. 07/14/16   Demetrios Loll, MD  nitroGLYCERIN (NITROSTAT) 0.4 MG SL tablet Place 1  tablet (0.4 mg total) under the tongue every 5 (five) minutes as needed for chest pain. 07/14/16   Demetrios Loll, MD  ondansetron (ZOFRAN ODT) 4 MG disintegrating tablet Take 1 tablet (4 mg total) by mouth every 8 (eight) hours as needed for nausea or vomiting. 04/17/16   Carrie Mew, MD  oxyCODONE-acetaminophen (ROXICET) 5-325 MG tablet Take 1-2 tablets by mouth every 4 (four) hours as needed. 07/21/16 07/21/17  Eula Listen, MD  penicillin v potassium (VEETID) 500 MG tablet Take 1 tablet (500 mg total) by mouth 4 (four) times  daily. 07/21/16   Eula Listen, MD    Allergies Other; Codeine; Diphenhydramine; Etodolac; Meperidine; Prednisone; and Venlafaxine    Social History Social History  Substance Use Topics  . Smoking status: Former Research scientist (life sciences)  . Smokeless tobacco: Never Used  . Alcohol use No    Review of Systems Patient denies headaches, rhinorrhea, blurry vision, numbness, shortness of breath, chest pain, edema, cough, abdominal pain, nausea, vomiting, diarrhea, dysuria, fevers, rashes or hallucinations unless otherwise stated above in HPI. ____________________________________________   PHYSICAL EXAM:  VITAL SIGNS: Vitals:   01/07/17 1346  BP: (!) 159/108  Pulse: 68  Resp: 18  Temp: 98.2 F (36.8 C)    Constitutional: Alert and oriented. Well appearing and in no acute distress. Eyes: Conjunctivae are normal.  Head: Atraumatic. Nose: No congestion/rhinnorhea. Mouth/Throat: Mucous membranes are moist.   Neck: No stridor. Painless ROM.  Cardiovascular: Normal rate, regular rhythm. Grossly normal heart sounds.  Good peripheral circulation. Respiratory: Normal respiratory effort.  No retractions. Lungs CTAB. Gastrointestinal: Soft and nontender. No distention. No abdominal bruits. No CVA tenderness. Genitourinary:  Musculoskeletal: No lower extremity tenderness nor edema.  No joint effusions. Neurologic: CN- intact.  No facial droop, Normal FNF.  Normal heel to shin.  Sensation intact bilaterally. Normal speech and language. No gross focal neurologic deficits are appreciated. No gait instability. Skin:  Skin is warm, dry and intact. No rash noted. Psychiatric: Mood and affect are normal. Speech and behavior are normal.  ____________________________________________   LABS (all labs ordered are listed, but only abnormal results are displayed)  Results for orders placed or performed during the hospital encounter of 01/07/17 (from the past 24 hour(s))  Urinalysis, Complete w  Microscopic     Status: Abnormal   Collection Time: 01/07/17  1:48 PM  Result Value Ref Range   Color, Urine COLORLESS (A) YELLOW   APPearance CLEAR (A) CLEAR   Specific Gravity, Urine 1.002 (L) 1.005 - 1.030   pH 6.0 5.0 - 8.0   Glucose, UA NEGATIVE NEGATIVE mg/dL   Hgb urine dipstick NEGATIVE NEGATIVE   Bilirubin Urine NEGATIVE NEGATIVE   Ketones, ur NEGATIVE NEGATIVE mg/dL   Protein, ur NEGATIVE NEGATIVE mg/dL   Nitrite NEGATIVE NEGATIVE   Leukocytes, UA NEGATIVE NEGATIVE   RBC / HPF 0-5 0 - 5 RBC/hpf   WBC, UA 0-5 0 - 5 WBC/hpf   Bacteria, UA NONE SEEN NONE SEEN   Squamous Epithelial / LPF 0-5 (A) NONE SEEN  Basic metabolic panel     Status: Abnormal   Collection Time: 01/07/17  1:49 PM  Result Value Ref Range   Sodium 135 135 - 145 mmol/L   Potassium 4.2 3.5 - 5.1 mmol/L   Chloride 101 101 - 111 mmol/L   CO2 28 22 - 32 mmol/L   Glucose, Bld 102 (H) 65 - 99 mg/dL   BUN 19 6 - 20 mg/dL   Creatinine, Ser 0.80 0.44 - 1.00 mg/dL  Calcium 9.3 8.9 - 10.3 mg/dL   GFR calc non Af Amer >60 >60 mL/min   GFR calc Af Amer >60 >60 mL/min   Anion gap 6 5 - 15  CBC     Status: None   Collection Time: 01/07/17  1:49 PM  Result Value Ref Range   WBC 10.0 3.6 - 11.0 K/uL   RBC 4.79 3.80 - 5.20 MIL/uL   Hemoglobin 14.5 12.0 - 16.0 g/dL   HCT 41.2 35.0 - 47.0 %   MCV 86.1 80.0 - 100.0 fL   MCH 30.3 26.0 - 34.0 pg   MCHC 35.2 32.0 - 36.0 g/dL   RDW 12.9 11.5 - 14.5 %   Platelets 286 150 - 440 K/uL  Troponin I     Status: None   Collection Time: 01/07/17  1:49 PM  Result Value Ref Range   Troponin I <0.03 <0.03 ng/mL   ____________________________________________  EKG My review and personal interpretation at Time: 13:51   Indication: lightheadedness  Rate: 70  Rhythm: sinus Axis: normal Other: normal intervals, no acute STE or depressions ____________________________________________   ____________________________________________   PROCEDURES  Procedure(s) performed:    Procedures    Critical Care performed: no ____________________________________________   INITIAL IMPRESSION / ASSESSMENT AND PLAN / ED COURSE  Pertinent labs & imaging results that were available during my care of the patient were reviewed by me and considered in my medical decision making (see chart for details).  DDX: tension HA, migraine, elevated BP, unlikely ICH, vertigo  DACODA SPALLONE is a 66 y.o. who presents to the ED with with Hx of migraines and HTN p/w HA for last 2 days. Not worst HA ever. Gradual onset. HA similar to previous episodes. Denies focal neurologic symptoms. Denies trauma. No fevers or neck pain. No vision loss. Afebrile in ED. VSS. Exam as above. No meningeal signs. No CN, motor, sensory or cerebellar deficits. Temporal arteries palpable and non-tender. Appears well and non-toxic.  Will provide IV fluids for hydration and IV medications for symptom control.  Likely migraine or possible tension or nonspecific HA. Clinical picture is not consistent with ICH, SAH, SDH, EDH, TIA, or CVA. No concern for meningitis or encephalitis. No concern for GCA/Temporal arteritis.      ----------------------------------------- 6:32 PM on 01/07/2017 -----------------------------------------   Pain improved. Repeat neuro exam is again without focal deficit, nuchal rigidity or evidence of meningeal irritation.  Able to ambulate with a steady gait.  No posterior circulation symptoms noted. Stable to D/C home, follow up with PCP or Neurology if persistent recurrent Has.  Have discussed with the patient and available family all diagnostics and treatments performed thus far and all questions were answered to the best of my ability. The patient demonstrates understanding and agreement with plan.     ____________________________________________   FINAL CLINICAL IMPRESSION(S) / ED DIAGNOSES  Final diagnoses:  Light headedness  Acute nonintractable headache, unspecified headache  type      NEW MEDICATIONS STARTED DURING THIS VISIT:  New Prescriptions   No medications on file     Note:  This document was prepared using Dragon voice recognition software and may include unintentional dictation errors.    Merlyn Lot, MD 01/07/17 [phone]

## 2017-01-21 ENCOUNTER — Emergency Department
Admission: EM | Admit: 2017-01-21 | Discharge: 2017-01-21 | Disposition: A | Discharge status: Home / Self Care | Payer: BLUE CROSS/BLUE SHIELD | Attending: Emergency Medicine | Admitting: Emergency Medicine

## 2017-01-21 ENCOUNTER — Encounter: Payer: Self-pay | Admitting: Emergency Medicine

## 2017-01-21 DIAGNOSIS — R05 Cough: Secondary | ICD-10-CM | POA: Diagnosis present

## 2017-01-21 DIAGNOSIS — J9801 Acute bronchospasm: Secondary | ICD-10-CM | POA: Diagnosis not present

## 2017-01-21 DIAGNOSIS — Z79899 Other long term (current) drug therapy: Secondary | ICD-10-CM | POA: Diagnosis not present

## 2017-01-21 DIAGNOSIS — Z87891 Personal history of nicotine dependence: Secondary | ICD-10-CM | POA: Diagnosis not present

## 2017-01-21 DIAGNOSIS — J449 Chronic obstructive pulmonary disease, unspecified: Secondary | ICD-10-CM | POA: Diagnosis not present

## 2017-01-21 DIAGNOSIS — I10 Essential (primary) hypertension: Secondary | ICD-10-CM | POA: Insufficient documentation

## 2017-01-21 DIAGNOSIS — J45909 Unspecified asthma, uncomplicated: Secondary | ICD-10-CM | POA: Diagnosis not present

## 2017-01-21 DIAGNOSIS — Z9049 Acquired absence of other specified parts of digestive tract: Secondary | ICD-10-CM | POA: Insufficient documentation

## 2017-01-21 DIAGNOSIS — Z7982 Long term (current) use of aspirin: Secondary | ICD-10-CM | POA: Insufficient documentation

## 2017-01-21 MED ORDER — HYDROCOD POLST-CPM POLST ER 10-8 MG/5ML PO SUER: 5.0000 mL | Freq: Two times a day (BID) | ORAL | 0 refills | Status: DC

## 2017-01-21 MED ORDER — METHYLPREDNISOLONE 4 MG PO TBPK: ORAL_TABLET | ORAL | 0 refills | Status: DC

## 2017-01-21 NOTE — ED Provider Notes (Signed)
[facility] Emergency Department Provider Note    ____________________________________________   First MD Initiated Contact with Patient 01/21/17 [phone]     (approximate)  I have reviewed the triage vital signs and the nursing notes.   HISTORY  Chief Complaint Cough and Sore Throat    HPI Andrea Ford is a 66 y.o. female patient complain a three-day history of productive cough and sore throat. Patient state inhaler was not controlling her cough. Patient said unable to sleep secondary to having a nonproductive cough. Patient denies any fever associated with this complaint.Patient denies pain with this complaint. Patient has a history of COPD and asthma.   Past Medical History:  Diagnosis Date  . Asthma   . COPD (chronic obstructive pulmonary disease) ([place])   . Hypertension   . Migraine     Patient Active Problem List   Diagnosis Date Noted  . Accelerated hypertension 07/13/2016  . Chest pain 07/13/2016  . Abnormal EKG 07/13/2016  . GAD (generalized anxiety disorder) 07/13/2016    Past Surgical History:  Procedure Laterality Date  . ANKLE SURGERY Right   . CARDIAC CATHETERIZATION Right 07/15/2016   Procedure: Left Heart Cath and Coronary Angiography;  Surgeon: Dionisio David, MD;  Location: [place] [facility];  Service: Cardiovascular;  Laterality: Right;  . CHOLECYSTECTOMY    . TUBAL LIGATION      Prior to Admission medications   Medication Sig Start Date End Date Taking? Authorizing Provider  albuterol (PROVENTIL HFA;VENTOLIN HFA) 108 (90 BASE) MCG/ACT inhaler Inhale into the lungs every 6 (six) hours as needed for wheezing or shortness of breath.    [provider]  aspirin 81 MG chewable tablet Chew 1 tablet (81 mg total) by mouth daily. 07/15/16   Demetrios Loll, MD  atorvastatin (LIPITOR) 40 MG tablet Take 1 tablet (40 mg total) by mouth daily at 6 PM. 07/15/16   Demetrios Loll, MD  busPIRone (BUSPAR) 7.5 MG tablet Take 7.5 mg by  mouth 2 (two) times daily. 05/02/16 05/02/17  [provider]  chlorpheniramine-HYDROcodone (TUSSIONEX PENNKINETIC ER) 10-8 MG/5ML SUER Take 5 mLs by mouth 2 (two) times daily. 01/21/17   Sable Feil, PA-C  Cholecalciferol (VITAMIN D-3 PO) Take 1 tablet by mouth daily.    [provider]  ciprofloxacin (CILOXAN) 0.3 % ophthalmic solution Place 2 drops into the right ear every 4 (four) hours while awake. 05/19/16   Triplett, Cari B, FNP  citalopram (CELEXA) 40 MG tablet Take 40 mg by mouth daily. 05/02/16   [provider]  famotidine (PEPCID) 40 MG tablet Take 1 tablet (40 mg total) by mouth every evening. 11/24/16 11/24/17  Nance Pear, MD  hydrOXYzine (ATARAX/VISTARIL) 25 MG tablet Take 25 mg by mouth daily as needed. 05/02/16   [provider]  ketorolac (TORADOL) 10 MG tablet Take 1 tablet (10 mg total) by mouth every 8 (eight) hours as needed for moderate pain (with food). 07/21/16   Eula Listen, MD  losartan (COZAAR) 100 MG tablet Take 100 mg by mouth daily. 02/01/16 01/31/17  [provider]  methylPREDNISolone (MEDROL DOSEPAK) 4 MG TBPK tablet Take Tapered dose as directed 01/21/17   Sable Feil, PA-C  metoprolol tartrate (LOPRESSOR) 25 MG tablet Take 1 tablet (25 mg total) by mouth 2 (two) times daily. 07/14/16   Demetrios Loll, MD  nitroGLYCERIN (NITROSTAT) 0.4 MG SL tablet Place 1 tablet (0.4 mg total) under the tongue every 5 (five) minutes as needed for chest pain.  07/14/16   Demetrios Loll, MD  ondansetron (ZOFRAN ODT) 4 MG disintegrating tablet Take 1 tablet (4 mg total) by mouth every 8 (eight) hours as needed for nausea or vomiting. 04/17/16   Carrie Mew, MD  oxyCODONE-acetaminophen (ROXICET) 5-325 MG tablet Take 1-2 tablets by mouth every 4 (four) hours as needed. 07/21/16 07/21/17  Eula Listen, MD  penicillin v potassium (VEETID) 500 MG tablet Take 1 tablet (500 mg total) by mouth 4 (four) times daily. 07/21/16   Eula Listen, MD    Allergies Other; Codeine; Diphenhydramine; Etodolac; Meperidine; Prednisone; and Venlafaxine  No family history on file.  Social History Social History  Substance Use Topics  . Smoking status: Former Research scientist (life sciences)  . Smokeless tobacco: Never Used  . Alcohol use No    Review of Systems  Constitutional: No fever/chills Eyes: No visual changes. ENT: No sore throat. Cardiovascular: Denies chest pain. Respiratory: Denies shortness of breath. Nonproductive cough Gastrointestinal: No abdominal pain.  No nausea, no vomiting.  No diarrhea.  No constipation. Genitourinary: Negative for dysuria. Musculoskeletal: Negative for back pain. Skin: Negative for rash. Neurological: Negative for headaches, focal weakness or numbness.   ____________________________________________   PHYSICAL EXAM:  VITAL SIGNS: ED Triage Vitals [01/21/17 0841]  Enc Vitals Group     BP 140/77     Pulse Rate 83     Resp 16     Temp 98.3 F (36.8 C)     Temp Source Oral     SpO2 97 %     Weight 170 lb (77.1 kg)     Height 5\' 1"  (1.549 m)     Head Circumference      Peak Flow      Pain Score      Pain Loc      Pain Edu?      Excl. in [place]?     Constitutional: Alert and oriented. Well appearing and in no acute distress. Eyes: Conjunctivae are normal. PERRL. EOMI. Head: Atraumatic. Nose: No congestion/rhinnorhea. Mouth/Throat: Mucous membranes are moist.  Oropharynx non-erythematous. Neck: No stridor. No cervical spine tenderness to palpation. Hematological/Lymphatic/Immunilogical: No cervical lymphadenopathy. Cardiovascular: Normal rate, regular rhythm. Grossly normal heart sounds.  Good peripheral circulation. Respiratory: Normal respiratory effort.  No retractions. Lungs CTAB.  edema.  No joint effusions. Neurologic:  Normal speech and language. No gross focal neurologic deficits are appreciated. No gait instability. Skin:  Skin is warm, dry and intact. No rash  noted. Psychiatric: Mood and affect are normal. Speech and behavior are normal.  ____________________________________________   LABS (all labs ordered are listed, but only abnormal results are displayed)  Labs Reviewed - No data to display ____________________________________________  EKG   ____________________________________________  RADIOLOGY  No results found.  ____________________________________________   PROCEDURES  Procedure(s) performed: None  Procedures  Critical Care performed: No  ____________________________________________   INITIAL IMPRESSION / ASSESSMENT AND PLAN / ED COURSE  Pertinent labs & imaging results that were available during my care of the patient were reviewed by me and considered in my medical decision making (see chart for details).  Cough due to bronchospasm. Patient given discharge care instructions. Patient advised to follow-up family doctor condition persists.      ____________________________________________   FINAL CLINICAL IMPRESSION(S) / ED DIAGNOSES  Final diagnoses:  Cough due to bronchospasm      NEW MEDICATIONS STARTED DURING THIS VISIT:  New Prescriptions   CHLORPHENIRAMINE-HYDROCODONE (TUSSIONEX PENNKINETIC ER) 10-8 MG/5ML SUER    Take 5 mLs by mouth 2 (two)  times daily.   METHYLPREDNISOLONE (MEDROL DOSEPAK) 4 MG TBPK TABLET    Take Tapered dose as directed     Note:  This document was prepared using Dragon voice recognition software and may include unintentional dictation errors.    Sable Feil, PA-C 01/21/17 0131    Lavonia Drafts, MD 01/21/17 1118

## 2017-01-21 NOTE — ED Notes (Signed)
See triage note  States she developed a sore throat and cough a few days ago  Having some increased pain in chest with cough and inspiration   Afebrile on arrival

## 2017-01-21 NOTE — ED Triage Notes (Signed)
C/O 3 day history of productive cough and sore throat.  Has been using inhalers as well, but still having cough.

## 2017-09-09 ENCOUNTER — Emergency Department
Admission: EM | Admit: 2017-09-09 | Discharge: 2017-09-09 | Disposition: A | Discharge status: Home / Self Care | Payer: BLUE CROSS/BLUE SHIELD | Attending: Emergency Medicine | Admitting: Emergency Medicine

## 2017-09-09 ENCOUNTER — Encounter: Payer: Self-pay | Admitting: Emergency Medicine

## 2017-09-09 DIAGNOSIS — I10 Essential (primary) hypertension: Secondary | ICD-10-CM | POA: Diagnosis not present

## 2017-09-09 DIAGNOSIS — H9203 Otalgia, bilateral: Secondary | ICD-10-CM | POA: Diagnosis present

## 2017-09-09 DIAGNOSIS — Z79899 Other long term (current) drug therapy: Secondary | ICD-10-CM | POA: Diagnosis not present

## 2017-09-09 DIAGNOSIS — Z7982 Long term (current) use of aspirin: Secondary | ICD-10-CM | POA: Insufficient documentation

## 2017-09-09 DIAGNOSIS — H6503 Acute serous otitis media, bilateral: Secondary | ICD-10-CM | POA: Insufficient documentation

## 2017-09-09 DIAGNOSIS — Z87891 Personal history of nicotine dependence: Secondary | ICD-10-CM | POA: Insufficient documentation

## 2017-09-09 DIAGNOSIS — J449 Chronic obstructive pulmonary disease, unspecified: Secondary | ICD-10-CM | POA: Insufficient documentation

## 2017-09-09 MED ORDER — BENZONATATE 100 MG PO CAPS: 100.0000 mg | ORAL_CAPSULE | Freq: Three times a day (TID) | ORAL | 0 refills | Status: DC | PRN

## 2017-09-09 NOTE — ED Triage Notes (Signed)
Pt presents with bi-lat ear pain. Pt states it started a few days ago. Pt has tried OTC ear relief drops and no relief. Pt is NAD awaiting EDP.

## 2017-09-09 NOTE — Discharge Instructions (Signed)
Your exam is consistent with sinus pressure and fluid behind the ears. You should restart your daily nasal spray. Take a daily allergy medicine, and consider a Benadryl at bedtime. Use your humidifier at bedtime and use nasal saline to moisturize your nose.

## 2017-09-09 NOTE — ED Provider Notes (Signed)
[facility] Emergency Department Provider Note ____________________________________________  Time seen: 1330  I have reviewed the triage vital signs and the nursing notes.  HISTORY  Chief Complaint  Otalgia  HPI Andrea Ford is a 67 y.o. female presents to the ED accompanied by her family, for evaluation of bilateral ear pressure and fullness.  Patient describes symptoms starting a few days prior.  She was concerned she had possible ear infection.  She is been using over-the-counter eardrops without significant relief.  She denies any fevers, chills, sweats.  She also denies any nausea, vomiting, dizziness, vertigo, tinnitus, or hearing loss.  She has been experiencing some sinus pressure and fullness as well as some postnasal drainage.  Severe that they might interfere with her blood pressure and heart medications.  Past Medical History:  Diagnosis Date  . Asthma   . COPD (chronic obstructive pulmonary disease) ([place])   . Hypertension   . Migraine     Patient Active Problem List   Diagnosis Date Noted  . Accelerated hypertension 07/13/2016  . Chest pain 07/13/2016  . Abnormal EKG 07/13/2016  . GAD (generalized anxiety disorder) 07/13/2016    Past Surgical History:  Procedure Laterality Date  . ANKLE SURGERY Right   . CARDIAC CATHETERIZATION Right 07/15/2016   Procedure: Left Heart Cath and Coronary Angiography;  Surgeon: Dionisio David, MD;  Location: [place] [facility];  Service: Cardiovascular;  Laterality: Right;  . CHOLECYSTECTOMY    . TUBAL LIGATION      Prior to Admission medications   Medication Sig Start Date End Date Taking? Authorizing Provider  albuterol (PROVENTIL HFA;VENTOLIN HFA) 108 (90 BASE) MCG/ACT inhaler Inhale into the lungs every 6 (six) hours as needed for wheezing or shortness of breath.    [provider]  aspirin 81 MG chewable tablet Chew 1 tablet (81 mg total) by mouth daily. 07/15/16   Demetrios Loll, MD   atorvastatin (LIPITOR) 40 MG tablet Take 1 tablet (40 mg total) by mouth daily at 6 PM. 07/15/16   Demetrios Loll, MD  benzonatate (TESSALON PERLES) 100 MG capsule Take 1 capsule (100 mg total) by mouth 3 (three) times daily as needed for cough (Take 1-2 per dose). 09/09/17   Acheron Sugg, Dannielle Karvonen, PA-C  chlorpheniramine-HYDROcodone (TUSSIONEX PENNKINETIC ER) 10-8 MG/5ML SUER Take 5 mLs by mouth 2 (two) times daily. 01/21/17   Sable Feil, PA-C  Cholecalciferol (VITAMIN D-3 PO) Take 1 tablet by mouth daily.    [provider]  ciprofloxacin (CILOXAN) 0.3 % ophthalmic solution Place 2 drops into the right ear every 4 (four) hours while awake. 05/19/16   Triplett, Cari B, FNP  citalopram (CELEXA) 40 MG tablet Take 40 mg by mouth daily. 05/02/16   [provider]  famotidine (PEPCID) 40 MG tablet Take 1 tablet (40 mg total) by mouth every evening. 11/24/16 11/24/17  Nance Pear, MD  hydrOXYzine (ATARAX/VISTARIL) 25 MG tablet Take 25 mg by mouth daily as needed. 05/02/16   [provider]  ketorolac (TORADOL) 10 MG tablet Take 1 tablet (10 mg total) by mouth every 8 (eight) hours as needed for moderate pain (with food). 07/21/16   Eula Listen, MD  losartan (COZAAR) 100 MG tablet Take 100 mg by mouth daily. 02/01/16 01/31/17  [provider]  methylPREDNISolone (MEDROL DOSEPAK) 4 MG TBPK tablet Take Tapered dose as directed 01/21/17   Sable Feil, PA-C  metoprolol tartrate (LOPRESSOR) 25 MG tablet Take 1 tablet (25 mg total) by mouth  2 (two) times daily. 07/14/16   Demetrios Loll, MD  nitroGLYCERIN (NITROSTAT) 0.4 MG SL tablet Place 1 tablet (0.4 mg total) under the tongue every 5 (five) minutes as needed for chest pain. 07/14/16   Demetrios Loll, MD  ondansetron (ZOFRAN ODT) 4 MG disintegrating tablet Take 1 tablet (4 mg total) by mouth every 8 (eight) hours as needed for nausea or vomiting. 04/17/16   Carrie Mew, MD  penicillin v potassium (VEETID) 500 MG tablet  Take 1 tablet (500 mg total) by mouth 4 (four) times daily. 07/21/16   Eula Listen, MD   Allergies Other; Codeine; Diphenhydramine; Etodolac; Meperidine; Prednisone; and Venlafaxine  No family history on file.  Social History Social History   Tobacco Use  . Smoking status: Former Research scientist (life sciences)  . Smokeless tobacco: Never Used  Substance Use Topics  . Alcohol use: No  . Drug use: No   Review of Systems  Constitutional: Negative for fever. Eyes: Negative for visual changes. ENT: Negative for sore throat. Reports bilateral ear fullness Cardiovascular: Negative for chest pain. Respiratory: Negative for shortness of breath. Gastrointestinal: Negative for abdominal pain, vomiting and diarrhea. Neurological: Negative for headaches, focal weakness or numbness. ____________________________________________  PHYSICAL EXAM:  VITAL SIGNS: ED Triage Vitals  Enc Vitals Group     BP 09/09/17 1251 134/86     Pulse Rate 09/09/17 1251 89     Resp --      Temp 09/09/17 1251 98 F (36.7 C)     Temp Source 09/09/17 1251 Oral     SpO2 09/09/17 1251 95 %     Weight 09/09/17 1256 165 lb (74.8 kg)     Height 09/09/17 1256 5\' 1"  (1.549 m)     Head Circumference --      Peak Flow --      Pain Score 09/09/17 1255 1     Pain Loc --      Pain Edu? --      Excl. in [place]? --     Constitutional: Alert and oriented. Well appearing and in no distress. Head: Normocephalic and atraumatic.  Patient with some increased frontal sinus pressure with facial palpation. Eyes: Conjunctivae are normal. PERRL. Normal extraocular movements Ears: Canals clear. TMs intact, dull, and retracted bilaterally.  Nose: No congestion/rhinorrhea/epistaxis. Mouth/Throat: Mucous membranes are moist.  Uvula is midline and tonsils are flat.  No oropharyngeal lesions are appreciated.  There is some generalized oropharyngeal erythema with some indication of postnasal drainage. Neck: Supple. No  thyromegaly. Hematological/Lymphatic/Immunological: No cervical lymphadenopathy. Cardiovascular: Normal rate, regular rhythm. Normal distal pulses. Respiratory: Normal respiratory effort. No wheezes/rales/rhonchi. ____________________________________________  INITIAL IMPRESSION / ASSESSMENT AND PLAN / ED COURSE  She with a ED evaluation of bilateral ear pressure and fullness.  Patient without any otorrhea, tinnitus, vertigo.  No indication of an acute otitis media.  Patient symptoms likely represent some mild serous effusion bilaterally.  She is advised that she can take an over-the-counter allergy medicine to help decrease postnasal drip and potentially drain the ears.  He is also advised to continue use her cool mist humidifier over her head at night and nasal steroids and saline during the day.  She will follow with primary care provider for ongoing symptoms.  Patient declined an offer for a steroid shot in the ED. ____________________________________________  FINAL CLINICAL IMPRESSION(S) / ED DIAGNOSES  Final diagnoses:  Otalgia of both ears  Bilateral acute serous otitis media, recurrence not specified      Lyda Colcord, Dannielle Karvonen, PA-C 09/09/17  1740    Earleen Newport, MD 09/10/17 [phone]

## 2017-09-12 DIAGNOSIS — H6983 Other specified disorders of Eustachian tube, bilateral: Secondary | ICD-10-CM | POA: Insufficient documentation

## 2017-10-05 ENCOUNTER — Emergency Department
Admission: EM | Admit: 2017-10-05 | Discharge: 2017-10-05 | Disposition: A | Discharge status: Home / Self Care | Payer: BLUE CROSS/BLUE SHIELD | Attending: Emergency Medicine | Admitting: Emergency Medicine

## 2017-10-05 ENCOUNTER — Encounter: Payer: Self-pay | Admitting: Emergency Medicine

## 2017-10-05 ENCOUNTER — Other Ambulatory Visit: Payer: Self-pay

## 2017-10-05 DIAGNOSIS — I10 Essential (primary) hypertension: Secondary | ICD-10-CM | POA: Diagnosis not present

## 2017-10-05 DIAGNOSIS — J449 Chronic obstructive pulmonary disease, unspecified: Secondary | ICD-10-CM | POA: Insufficient documentation

## 2017-10-05 DIAGNOSIS — Z79899 Other long term (current) drug therapy: Secondary | ICD-10-CM | POA: Diagnosis not present

## 2017-10-05 DIAGNOSIS — Z7982 Long term (current) use of aspirin: Secondary | ICD-10-CM | POA: Diagnosis not present

## 2017-10-05 DIAGNOSIS — Z87891 Personal history of nicotine dependence: Secondary | ICD-10-CM | POA: Diagnosis not present

## 2017-10-05 DIAGNOSIS — F419 Anxiety disorder, unspecified: Secondary | ICD-10-CM | POA: Diagnosis not present

## 2017-10-05 DIAGNOSIS — G8929 Other chronic pain: Secondary | ICD-10-CM | POA: Insufficient documentation

## 2017-10-05 DIAGNOSIS — J45909 Unspecified asthma, uncomplicated: Secondary | ICD-10-CM | POA: Diagnosis not present

## 2017-10-05 DIAGNOSIS — M25569 Pain in unspecified knee: Secondary | ICD-10-CM | POA: Diagnosis not present

## 2017-10-05 LAB — CBC
HCT: 41.8 % (ref 35.0–47.0)
HEMOGLOBIN: 14.2 g/dL (ref 12.0–16.0)
MCH: 29.5 pg (ref 26.0–34.0)
MCHC: 33.9 g/dL (ref 32.0–36.0)
MCV: 86.8 fL (ref 80.0–100.0)
Platelets: 250 10*3/uL (ref 150–440)
RBC: 4.81 MIL/uL (ref 3.80–5.20)
RDW: 13 % (ref 11.5–14.5)
WBC: 6.5 10*3/uL (ref 3.6–11.0)

## 2017-10-05 LAB — BASIC METABOLIC PANEL
ANION GAP: 10 (ref 5–15)
BUN: 17 mg/dL (ref 6–20)
CO2: 22 mmol/L (ref 22–32)
CREATININE: 0.75 mg/dL (ref 0.44–1.00)
Calcium: 8.6 mg/dL — ABNORMAL LOW (ref 8.9–10.3)
Chloride: 105 mmol/L (ref 101–111)
GFR calc Af Amer: >60 mL/min (ref >60)
GFR calc non Af Amer: >60 mL/min (ref >60)
GLUCOSE: 110 mg/dL — AB (ref 65–99)
Potassium: 4 mmol/L (ref 3.5–5.1)
Sodium: 137 mmol/L (ref 135–145)

## 2017-10-05 LAB — URINALYSIS, COMPLETE (UACMP) WITH MICROSCOPIC
Bilirubin Urine: NEGATIVE
GLUCOSE, UA: NEGATIVE mg/dL
Ketones, ur: NEGATIVE mg/dL
Nitrite: NEGATIVE
PROTEIN: NEGATIVE mg/dL
SPECIFIC GRAVITY, URINE: 1.013 (ref 1.005–1.030)
pH: 5 (ref 5.0–8.0)

## 2017-10-05 LAB — TROPONIN I: Troponin I: <0.03 ng/mL (ref <0.03)

## 2017-10-05 MED ORDER — LORAZEPAM 2 MG/ML IJ SOLN
1.0000 mg | Freq: Once | INTRAMUSCULAR | Status: AC
  Administered 2017-10-05: 1 mg via INTRAVENOUS
  Filled 2017-10-05: qty 1

## 2017-10-05 MED ORDER — METOCLOPRAMIDE HCL 5 MG/ML IJ SOLN
10.0000 mg | Freq: Once | INTRAMUSCULAR | Status: AC
  Administered 2017-10-05: 10 mg via INTRAVENOUS
  Filled 2017-10-05: qty 2

## 2017-10-05 MED ORDER — SODIUM CHLORIDE 0.9 % IV BOLUS (SEPSIS)
500.0000 mL | Freq: Once | INTRAVENOUS | Status: AC
  Administered 2017-10-05: 500 mL via INTRAVENOUS

## 2017-10-05 NOTE — ED Provider Notes (Signed)
[facility] Emergency Department Provider Note  Time seen: 8:02 PM  I have reviewed the triage vital signs and the nursing notes.   HISTORY  Chief Complaint Hypertension    HPI Andrea Ford is a 67 y.o. female with a past medical history of hypertension, COPD, asthma, anxiety, presents to the emergency department for elevated blood pressure.  States at home it was as high as 160/120.  States she has had many issues with her blood pressure in the past spiking which concerned her so she came to the emergency department for evaluation.  Patient admits she is very anxious about her blood pressure, she has rapid speech throughout our evaluation.  States underlying anxiety disorder but did not take her Klonopin today because she did not feel overly anxious.  Patient denies any chest pain or trouble breathing.  States she is having bilateral knee pain but states that is chronic.  Largely negative review of systems otherwise.  Past Medical History:  Diagnosis Date  . Asthma   . COPD (chronic obstructive pulmonary disease) ([place])   . Hypertension   . Migraine     Patient Active Problem List   Diagnosis Date Noted  . Accelerated hypertension 07/13/2016  . Chest pain 07/13/2016  . Abnormal EKG 07/13/2016  . GAD (generalized anxiety disorder) 07/13/2016    Past Surgical History:  Procedure Laterality Date  . ANKLE SURGERY Right   . CARDIAC CATHETERIZATION Right 07/15/2016   Procedure: Left Heart Cath and Coronary Angiography;  Surgeon: Dionisio David, MD;  Location: [place] [facility];  Service: Cardiovascular;  Laterality: Right;  . CHOLECYSTECTOMY    . TUBAL LIGATION      Prior to Admission medications   Medication Sig Start Date End Date Taking? Authorizing Provider  albuterol (PROVENTIL HFA;VENTOLIN HFA) 108 (90 BASE) MCG/ACT inhaler Inhale into the lungs every 6 (six) hours as needed for wheezing or shortness of breath.    [provider]   aspirin 81 MG chewable tablet Chew 1 tablet (81 mg total) by mouth daily. 07/15/16   Demetrios Loll, MD  atorvastatin (LIPITOR) 40 MG tablet Take 1 tablet (40 mg total) by mouth daily at 6 PM. 07/15/16   Demetrios Loll, MD  benzonatate (TESSALON PERLES) 100 MG capsule Take 1 capsule (100 mg total) by mouth 3 (three) times daily as needed for cough (Take 1-2 per dose). 09/09/17   Menshew, Dannielle Karvonen, PA-C  Cholecalciferol (VITAMIN D-3 PO) Take 1 tablet by mouth daily.    [provider]  ciprofloxacin (CILOXAN) 0.3 % ophthalmic solution Place 2 drops into the right ear every 4 (four) hours while awake. 05/19/16   Triplett, Cari B, FNP  citalopram (CELEXA) 40 MG tablet Take 40 mg by mouth daily. 05/02/16   [provider]  famotidine (PEPCID) 40 MG tablet Take 1 tablet (40 mg total) by mouth every evening. 11/24/16 11/24/17  Nance Pear, MD  hydrOXYzine (ATARAX/VISTARIL) 25 MG tablet Take 25 mg by mouth daily as needed. 05/02/16   [provider]  losartan (COZAAR) 100 MG tablet Take 100 mg by mouth daily. 02/01/16 01/31/17  [provider]  metoprolol tartrate (LOPRESSOR) 25 MG tablet Take 1 tablet (25 mg total) by mouth 2 (two) times daily. 07/14/16   Demetrios Loll, MD  nitroGLYCERIN (NITROSTAT) 0.4 MG SL tablet Place 1 tablet (0.4 mg total) under the tongue every 5 (five) minutes as needed for chest pain. 07/14/16   Demetrios Loll, MD  Allergies  Allergen Reactions  . Other Other (See Comments)    Pet dander  . Codeine Itching  . Diphenhydramine Anxiety  . Etodolac Rash  . Meperidine Rash  . Prednisone Rash  . Venlafaxine Rash    History reviewed. No pertinent family history.  Social History Social History   Tobacco Use  . Smoking status: Former Research scientist (life sciences)  . Smokeless tobacco: Never Used  Substance Use Topics  . Alcohol use: No  . Drug use: No    Review of Systems Constitutional: Negative for fever.  States dizziness today. Eyes: Negative for visual  complaints ENT: Negative for recent illness/congestion Cardiovascular: Negative for chest pain. Respiratory: Negative for shortness of breath. Gastrointestinal: Negative for abdominal pain, vomiting and diarrhea. Genitourinary: Negative for urinary compaints Musculoskeletal: Lateral knee pain, chronic. Skin: Negative for skin complaints  Neurological: Moderate headache today All other ROS negative  ____________________________________________   PHYSICAL EXAM:  VITAL SIGNS: ED Triage Vitals  Enc Vitals Group     BP 10/05/17 1726 (!) 160/92     Pulse Rate 10/05/17 1726 (!) 103     Resp 10/05/17 1726 18     Temp 10/05/17 1726 98.3 F (36.8 C)     Temp Source 10/05/17 1726 Oral     SpO2 10/05/17 1726 97 %     Weight 10/05/17 1730 168 lb (76.2 kg)     Height 10/05/17 1730 5\' 1"  (1.549 m)     Head Circumference --      Peak Flow --      Pain Score 10/05/17 1727 6     Pain Loc --      Pain Edu? --      Excl. in [place]? --    Constitutional: Alert and oriented.  Moderately anxious appearing, admits to being anxious about her blood pressure being elevated is concerned that she could have a stroke or heart attack.  States mild headache currently. Eyes: Normal exam ENT   Head: Normocephalic and atraumatic.   Mouth/Throat: Mucous membranes are moist. Cardiovascular: Regular rhythm, rate around 100 bpm. Respiratory: Normal respiratory effort without tachypnea nor retractions. Breath sounds are clear Gastrointestinal: Soft and nontender. No distention.  Musculoskeletal: Range of motion bilateral lower extremities.  No significant tenderness or edema. Neurologic:  Normal speech and language. No gross focal neurologic deficits  Skin:  Skin is warm, dry and intact.  Psychiatric: Fairly rapid speech, appears quite anxious.  ____________________________________________    EKG  EKG reviewed and interpreted by myself shows normal sinus rhythm 89 bpm with a narrow QRS, normal axis,  normal intervals, no concerning ST changes.  ____________________________________________   INITIAL IMPRESSION / ASSESSMENT AND PLAN / ED COURSE  Pertinent labs & imaging results that were available during my care of the patient were reviewed by me and considered in my medical decision making (see chart for details).  Patient presents to the emergency department for elevated blood pressure.  Patient is quite anxious during my examination.  Differential would include hypertension, anxiety, headache.  Overall the patient appears well, no distress but is anxious.  We will treat with Ativan, normal saline and Reglan and continue to closely monitor.  Reassuringly patient's lab work is normal including troponin.  Urinalysis is normal.  Patient's blood pressure down to 145/92.  Headache is nearly resolved per patient.  We will discharge the patient home with PCP follow-up.  Patient agreeable to plan. ____________________________________________   FINAL CLINICAL IMPRESSION(S) / ED DIAGNOSES  Hypertension Anxiety    Camren Lipsett,  Lennette Bihari, MD 10/05/17 2046

## 2017-10-05 NOTE — ED Triage Notes (Signed)
Discussed pt with dr Cherylann Banas. No imaging at this time, will see MD first. No stroke work up.

## 2017-10-05 NOTE — ED Triage Notes (Signed)
Pt appears very anxious with rapid speech.  Here today because reports her BP at home was 150s/106.  Has history of BP spiking at times.  Worried having a heart attack or something per pt.  Pt also keeps talking about knees hurting since they switched the medication to a new injection.  Today mostly seems worried about BP and has been feeling lightheaded at times today.  ambulatory with steady gait to triage.  Does have headache per pt that started at 1330. Grip equal.  Speech clear. No facial droop.

## 2017-10-05 NOTE — ED Notes (Signed)
Took her bp meds prior to arrival

## 2017-10-05 NOTE — ED Notes (Signed)
Pt states has labile bp and her bp was going up and down today.

## 2017-10-07 LAB — URINE CULTURE

## 2017-12-09 DIAGNOSIS — N3946 Mixed incontinence: Secondary | ICD-10-CM | POA: Insufficient documentation

## 2018-01-16 DIAGNOSIS — R351 Nocturia: Secondary | ICD-10-CM | POA: Insufficient documentation

## 2018-01-16 DIAGNOSIS — D259 Leiomyoma of uterus, unspecified: Secondary | ICD-10-CM | POA: Insufficient documentation

## 2018-06-25 ENCOUNTER — Emergency Department
Admission: EM | Admit: 2018-06-25 | Discharge: 2018-06-25 | Disposition: A | Discharge status: Home / Self Care | Payer: Medicare Other | Attending: Emergency Medicine | Admitting: Emergency Medicine

## 2018-06-25 ENCOUNTER — Emergency Department: Payer: Medicare Other

## 2018-06-25 ENCOUNTER — Other Ambulatory Visit: Payer: Self-pay

## 2018-06-25 ENCOUNTER — Encounter: Payer: Self-pay | Admitting: Emergency Medicine

## 2018-06-25 DIAGNOSIS — F411 Generalized anxiety disorder: Secondary | ICD-10-CM | POA: Insufficient documentation

## 2018-06-25 DIAGNOSIS — Z9049 Acquired absence of other specified parts of digestive tract: Secondary | ICD-10-CM | POA: Insufficient documentation

## 2018-06-25 DIAGNOSIS — Z79899 Other long term (current) drug therapy: Secondary | ICD-10-CM | POA: Diagnosis not present

## 2018-06-25 DIAGNOSIS — Z87891 Personal history of nicotine dependence: Secondary | ICD-10-CM | POA: Insufficient documentation

## 2018-06-25 DIAGNOSIS — J209 Acute bronchitis, unspecified: Secondary | ICD-10-CM

## 2018-06-25 DIAGNOSIS — J45909 Unspecified asthma, uncomplicated: Secondary | ICD-10-CM | POA: Diagnosis not present

## 2018-06-25 DIAGNOSIS — R05 Cough: Secondary | ICD-10-CM | POA: Diagnosis present

## 2018-06-25 DIAGNOSIS — J449 Chronic obstructive pulmonary disease, unspecified: Secondary | ICD-10-CM | POA: Diagnosis not present

## 2018-06-25 DIAGNOSIS — I10 Essential (primary) hypertension: Secondary | ICD-10-CM | POA: Insufficient documentation

## 2018-06-25 LAB — BASIC METABOLIC PANEL
ANION GAP: 9 (ref 5–15)
BUN: 10 mg/dL (ref 8–23)
CHLORIDE: 103 mmol/L (ref 98–111)
CO2: 31 mmol/L (ref 22–32)
Calcium: 9.2 mg/dL (ref 8.9–10.3)
Creatinine, Ser: 0.73 mg/dL (ref 0.44–1.00)
GFR calc Af Amer: >60 mL/min (ref >60)
GFR calc non Af Amer: >60 mL/min (ref >60)
GLUCOSE: 110 mg/dL — AB (ref 70–99)
POTASSIUM: 4.2 mmol/L (ref 3.5–5.1)
Sodium: 143 mmol/L (ref 135–145)

## 2018-06-25 LAB — TROPONIN I

## 2018-06-25 LAB — CBC WITH DIFFERENTIAL/PLATELET
ABS IMMATURE GRANULOCYTES: 0.02 10*3/uL (ref 0.00–0.07)
BASOS ABS: 0.1 10*3/uL (ref 0.0–0.1)
BASOS PCT: 1 %
EOS ABS: 0.1 10*3/uL (ref 0.0–0.5)
Eosinophils Relative: 1 %
HCT: 44.8 % (ref 36.0–46.0)
Hemoglobin: 14.4 g/dL (ref 12.0–15.0)
Immature Granulocytes: 0 %
LYMPHS ABS: 1.3 10*3/uL (ref 0.7–4.0)
Lymphocytes Relative: 22 %
MCH: 29.4 pg (ref 26.0–34.0)
MCHC: 32.1 g/dL (ref 30.0–36.0)
MCV: 91.4 fL (ref 80.0–100.0)
MONOS PCT: 10 %
Monocytes Absolute: 0.6 10*3/uL (ref 0.1–1.0)
NEUTROS ABS: 3.6 10*3/uL (ref 1.7–7.7)
NRBC: 0 % (ref 0.0–0.2)
Neutrophils Relative %: 66 %
PLATELETS: 237 10*3/uL (ref 150–400)
RBC: 4.9 MIL/uL (ref 3.87–5.11)
RDW: 12.6 % (ref 11.5–15.5)
WBC: 5.6 10*3/uL (ref 4.0–10.5)

## 2018-06-25 LAB — FIBRIN DERIVATIVES D-DIMER (ARMC ONLY): Fibrin derivatives D-dimer (ARMC): 465.38 ng/mL (FEU) (ref 0.00–499.00)

## 2018-06-25 MED ORDER — METHYLPREDNISOLONE SODIUM SUCC 125 MG IJ SOLR
80.0000 mg | Freq: Once | INTRAMUSCULAR | Status: DC
  Filled 2018-06-25: qty 2

## 2018-06-25 MED ORDER — PREDNISONE 20 MG PO TABS: 40.0000 mg | ORAL_TABLET | Freq: Every day | ORAL | 0 refills | Status: DC

## 2018-06-25 MED ORDER — ALBUTEROL SULFATE (2.5 MG/3ML) 0.083% IN NEBU: 2.5000 mg | INHALATION_SOLUTION | Freq: Four times a day (QID) | RESPIRATORY_TRACT | 0 refills | Status: AC | PRN

## 2018-06-25 MED ORDER — SODIUM CHLORIDE 0.9 % IV BOLUS
1000.0000 mL | Freq: Once | INTRAVENOUS | Status: AC
  Administered 2018-06-25: 1000 mL via INTRAVENOUS

## 2018-06-25 MED ORDER — AZITHROMYCIN 250 MG PO TABS: ORAL_TABLET | ORAL | 0 refills | Status: DC

## 2018-06-25 MED ORDER — IPRATROPIUM-ALBUTEROL 0.5-2.5 (3) MG/3ML IN SOLN
3.0000 mL | Freq: Once | RESPIRATORY_TRACT | Status: AC
  Administered 2018-06-25: 3 mL via RESPIRATORY_TRACT
  Filled 2018-06-25: qty 3

## 2018-06-25 NOTE — ED Provider Notes (Signed)
[facility] Emergency Department Provider Note  ____________________________________________  Time seen: Approximately 2:19 PM  I have reviewed the triage vital signs and the nursing notes.   HISTORY  Chief Complaint Chest Pain and Cough    HPI Andrea Ford is a 67 y.o. female with a history of COPD and hypertension who complains of nonproductive cough, rhinorrhea, congestion, shortness of breath, and chest tightness with coughing for the past 4 days.  Not exertional.   No fevers chills or sweats.  No recent travel trauma hospitalization or surgery.  No history of DVT or PE.  Tried using her inhaler at home without relief.  She is out of her albuterol nebulizer solution.  Symptoms are constant waxing and waning.  No aggravating or alleviating factors.     Past Medical History:  Diagnosis Date  . Asthma   . COPD (chronic obstructive pulmonary disease) ([place])   . Hypertension   . Migraine      Patient Active Problem List   Diagnosis Date Noted  . Accelerated hypertension 07/13/2016  . Chest pain 07/13/2016  . Abnormal EKG 07/13/2016  . GAD (generalized anxiety disorder) 07/13/2016     Past Surgical History:  Procedure Laterality Date  . ANKLE SURGERY Right   . CARDIAC CATHETERIZATION Right 07/15/2016   Procedure: Left Heart Cath and Coronary Angiography;  Surgeon: Dionisio David, MD;  Location: [place] [facility];  Service: Cardiovascular;  Laterality: Right;  . CHOLECYSTECTOMY    . TUBAL LIGATION       Prior to Admission medications   Medication Sig Start Date End Date Taking? Authorizing Provider  albuterol (PROVENTIL HFA;VENTOLIN HFA) 108 (90 BASE) MCG/ACT inhaler Inhale into the lungs every 6 (six) hours as needed for wheezing or shortness of breath.   Yes [provider]  atorvastatin (LIPITOR) 40 MG tablet Take 1 tablet (40 mg total) by mouth daily at 6 PM. Patient taking differently: Take 80 mg by mouth daily at 6 PM.   07/15/16  Yes Demetrios Loll, MD  carvedilol (COREG) 6.25 MG tablet Take 6.25 mg by mouth at bedtime.   Yes [provider]  Cholecalciferol (VITAMIN D-3 PO) Take 1 tablet by mouth daily.   Yes [provider]  citalopram (CELEXA) 20 MG tablet Take 20 mg by mouth daily.  05/02/16  Yes [provider]  clonazePAM (KLONOPIN) 0.5 MG tablet Take 0.25 mg by mouth 2 (two) times daily as needed for anxiety.   Yes [provider]  dicyclomine (BENTYL) 20 MG tablet Take 20 mg by mouth daily as needed for spasms.   Yes [provider]  famotidine (PEPCID) 40 MG tablet Take 1 tablet (40 mg total) by mouth every evening. 11/24/16 06/25/18 Yes Nance Pear, MD  losartan (COZAAR) 100 MG tablet Take 100 mg by mouth daily. 02/01/16 06/25/18 Yes [provider]  traZODone (DESYREL) 50 MG tablet Take 50 mg by mouth at bedtime as needed for sleep.   Yes [provider]  albuterol (PROVENTIL) (2.5 MG/3ML) 0.083% nebulizer solution Take 3 mLs (2.5 mg total) by nebulization every 6 (six) hours as needed for wheezing or shortness of breath. 06/25/18   Carrie Mew, MD  aspirin 81 MG chewable tablet Chew 1 tablet (81 mg total) by mouth daily. Patient not taking: Reported on 06/25/2018 07/15/16   Demetrios Loll, MD  azithromycin (ZITHROMAX Z-PAK) 250 MG tablet Take 2 tablets (500 mg) on  Day 1,  followed by 1 tablet (250 mg) once daily  on Days 2 through 5. 06/25/18   Carrie Mew, MD  benzonatate (TESSALON PERLES) 100 MG capsule Take 1 capsule (100 mg total) by mouth 3 (three) times daily as needed for cough (Take 1-2 per dose). Patient not taking: Reported on 06/25/2018 09/09/17   Menshew, Dannielle Karvonen, PA-C  ciprofloxacin (CILOXAN) 0.3 % ophthalmic solution Place 2 drops into the right ear every 4 (four) hours while awake. Patient not taking: Reported on 06/25/2018 05/19/16   Sherrie George B, FNP  hydrOXYzine (ATARAX/VISTARIL) 25 MG tablet Take 25 mg by  mouth daily as needed. 05/02/16   [provider]  metoprolol tartrate (LOPRESSOR) 25 MG tablet Take 1 tablet (25 mg total) by mouth 2 (two) times daily. Patient not taking: Reported on 06/25/2018 07/14/16   Demetrios Loll, MD  nitroGLYCERIN (NITROSTAT) 0.4 MG SL tablet Place 1 tablet (0.4 mg total) under the tongue every 5 (five) minutes as needed for chest pain. Patient not taking: Reported on 06/25/2018 07/14/16   Demetrios Loll, MD  predniSONE (DELTASONE) 20 MG tablet Take 2 tablets (40 mg total) by mouth daily. 06/25/18   Carrie Mew, MD     Allergies Other; Codeine; Diphenhydramine; Etodolac; Meperidine; Prednisone; and Venlafaxine   No family history on file.  Social History Social History   Tobacco Use  . Smoking status: Former Research scientist (life sciences)  . Smokeless tobacco: Never Used  Substance Use Topics  . Alcohol use: No  . Drug use: No    Review of Systems  Constitutional:   No fever or chills.  ENT:   Positive sore throat.  Positive rhinorrhea. Cardiovascular: Positive chest tightness without syncope. Respiratory: Positive shortness of breath and nonproductive cough. Gastrointestinal:   Negative for abdominal pain, vomiting and diarrhea.  Musculoskeletal:   Negative for focal pain or swelling All other systems reviewed and are negative except as documented above in ROS and HPI.  ____________________________________________   PHYSICAL EXAM:  VITAL SIGNS: ED Triage Vitals [06/25/18 0925]  Enc Vitals Group     BP (!) 146/76     Pulse Rate (!) 101     Resp 18     Temp 98.1 F (36.7 C)     Temp Source Oral     SpO2 97 %     Weight 154 lb (69.9 kg)     Height 5' 1.5" (1.562 m)     Head Circumference      Peak Flow      Pain Score 8     Pain Loc      Pain Edu?      Excl. in [place]?     Vital signs reviewed, nursing assessments reviewed.   Constitutional:   Alert and oriented. Non-toxic appearance. Eyes:   Conjunctivae are normal. EOMI. PERRL. ENT      Head:    Normocephalic and atraumatic.      Nose:   No congestion/rhinnorhea.       Mouth/Throat:   MMM, no pharyngeal erythema. No peritonsillar mass.       Neck:   No meningismus. Full ROM. Hematological/Lymphatic/Immunilogical:   No cervical lymphadenopathy. Cardiovascular:   RRR. Symmetric bilateral radial and DP pulses.  No murmurs. Cap refill less than 2 seconds. Respiratory:   Normal respiratory effort without tachypnea/retractions.  Diffuse expiratory wheezing and prolonged expiratory phase, accentuated by FEV1 maneuver. Gastrointestinal:   Soft and nontender. Non distended. There is no CVA tenderness.  No rebound, rigidity, or guarding.  Musculoskeletal:   Normal range of motion in all extremities.  No joint effusions.  No lower extremity tenderness.  No edema.  Chest wall tender anteriorly when the patient palpates it, reproducing her tightness symptoms according to the patient Neurologic:   Normal speech and language.  Motor grossly intact. No acute focal neurologic deficits are appreciated.  Skin:    Skin is warm, dry and intact. No rash noted.  No petechiae, purpura, or bullae.  ____________________________________________    LABS (pertinent positives/negatives) (all labs ordered are listed, but only abnormal results are displayed) Labs Reviewed  BASIC METABOLIC PANEL - Abnormal; Notable for the following components:      Result Value   Glucose, Bld 110 (*)    All other components within normal limits  TROPONIN I  CBC WITH DIFFERENTIAL/PLATELET  FIBRIN DERIVATIVES D-DIMER ([facility] ONLY)   ____________________________________________   EKG  Interpreted by me Normal sinus rhythm rate of 93, normal axis and intervals.  Normal QRS ST segments and T waves.  No significant change compared to October 06, 2017  Repeat EKG performed at 9:45 AM, sinus rhythm rate of 79, grossly unchanged without acute ischemic changes.  ____________________________________________    RADIOLOGY  Dg  Chest 2 View  Result Date: 06/25/2018 CLINICAL DATA:  Chest pressure.  Productive cough. EXAM: CHEST - 2 VIEW COMPARISON:  09/24/2015.  Chest x-ray 09/24/2015. FINDINGS: Mediastinum hilar structures normal. Cardiomegaly with normal pulmonary vascularity. Mild bibasilar subsegmental atelectasis. No pleural effusion or pneumothorax. Degenerative change thoracic spine. IMPRESSION: 1.  Mild bibasilar subsegmental atelectasis. 2.  Cardiomegaly.  No pulmonary venous congestion. Electronically Signed   By: Marcello Moores  Register   On: 06/25/2018 10:57    ____________________________________________   PROCEDURES Procedures  ____________________________________________  DIFFERENTIAL DIAGNOSIS   Bronchitis/COPD exacerbation, pneumonia, pneumothorax, pleural effusion, PE.  Doubt ACS pericarditis or dissection.  Not septic  CLINICAL IMPRESSION / ASSESSMENT AND PLAN / ED COURSE  Pertinent labs & imaging results that were available during my care of the patient were reviewed by me and considered in my medical decision making (see chart for details).    Patient presents with shortness of breath and cough and multiple URI symptoms.  With her underlying lung disease, check labs and chest x-ray.  Solu-Medrol and DuoNeb for symptom relief.  Check a d-dimer to risk stratify for PE.  Clinical Course as of Jun 25 1418  Thu Jun 25, 2018  1004 Needs Refill of  albuterol nebs   [PS]  1038 Pt refused steroid when nurse tried to administer, despite my reviewing role of steroid in current management and h/o prednisone side effects.   [PS]    Clinical Course User Index [PS] Carrie Mew, MD    ----------------------------------------- 2:22 PM on 06/25/2018 -----------------------------------------  Work-up negative.  Patient feeling better after DuoNeb despite refusing the steroids.  Vital signs remain unremarkable, oxygenation is normal.  Continue bronchodilators, short course of azithromycin.  Offered  prednisone.  Follow-up with primary care.  At this point I doubt ACS PE dissection AAA or pneumonia.   ____________________________________________   FINAL CLINICAL IMPRESSION(S) / ED DIAGNOSES    Final diagnoses:  Acute bronchitis, unspecified organism     ED Discharge Orders         Ordered    predniSONE (DELTASONE) 20 MG tablet  Daily     06/25/18 1419    albuterol (PROVENTIL) (2.5 MG/3ML) 0.083% nebulizer solution  Every 6 hours PRN     06/25/18 1419    azithromycin (ZITHROMAX Z-PAK) 250 MG tablet     06/25/18 1419  Portions of this note were generated with dragon dictation software. Dictation errors may occur despite best attempts at proofreading.    Carrie Mew, MD 06/25/18 [id number]

## 2018-06-25 NOTE — ED Triage Notes (Signed)
First RN: Pt c/o painful chest, cough, sneezing, runny nose, and intermittent nausea. Pt with noted cough in triage. Pt A&O x4, ambulatory to the desk without difficulty, able to speak in complete sentences on arrival.

## 2018-06-25 NOTE — ED Triage Notes (Signed)
Pt arrived with complaints chest pressure, productive cough, and congestion that started 3-4 days ago. Pt states her chest pain has felt like pressure and started a few days prior but states when she takes a deep breath or cough the pain becomes sharp.

## 2018-06-30 ENCOUNTER — Other Ambulatory Visit: Payer: Self-pay

## 2018-06-30 ENCOUNTER — Emergency Department
Admission: EM | Admit: 2018-06-30 | Discharge: 2018-06-30 | Disposition: A | Discharge status: Home / Self Care | Payer: Medicare Other | Attending: Emergency Medicine | Admitting: Emergency Medicine

## 2018-06-30 ENCOUNTER — Encounter: Payer: Self-pay | Admitting: Emergency Medicine

## 2018-06-30 ENCOUNTER — Emergency Department: Payer: Medicare Other

## 2018-06-30 DIAGNOSIS — Z87891 Personal history of nicotine dependence: Secondary | ICD-10-CM | POA: Diagnosis not present

## 2018-06-30 DIAGNOSIS — J45909 Unspecified asthma, uncomplicated: Secondary | ICD-10-CM | POA: Insufficient documentation

## 2018-06-30 DIAGNOSIS — F419 Anxiety disorder, unspecified: Secondary | ICD-10-CM | POA: Diagnosis not present

## 2018-06-30 DIAGNOSIS — R05 Cough: Secondary | ICD-10-CM | POA: Diagnosis present

## 2018-06-30 DIAGNOSIS — J069 Acute upper respiratory infection, unspecified: Secondary | ICD-10-CM | POA: Diagnosis not present

## 2018-06-30 DIAGNOSIS — B9789 Other viral agents as the cause of diseases classified elsewhere: Secondary | ICD-10-CM

## 2018-06-30 DIAGNOSIS — I10 Essential (primary) hypertension: Secondary | ICD-10-CM | POA: Insufficient documentation

## 2018-06-30 DIAGNOSIS — R197 Diarrhea, unspecified: Secondary | ICD-10-CM

## 2018-06-30 DIAGNOSIS — Z9049 Acquired absence of other specified parts of digestive tract: Secondary | ICD-10-CM | POA: Diagnosis not present

## 2018-06-30 DIAGNOSIS — M940 Chondrocostal junction syndrome [Tietze]: Secondary | ICD-10-CM

## 2018-06-30 DIAGNOSIS — J449 Chronic obstructive pulmonary disease, unspecified: Secondary | ICD-10-CM | POA: Diagnosis not present

## 2018-06-30 LAB — URINALYSIS, COMPLETE (UACMP) WITH MICROSCOPIC
Bacteria, UA: NONE SEEN
Bilirubin Urine: NEGATIVE
Glucose, UA: NEGATIVE mg/dL
Hgb urine dipstick: NEGATIVE
Ketones, ur: NEGATIVE mg/dL
Leukocytes, UA: NEGATIVE
Nitrite: NEGATIVE
PROTEIN: NEGATIVE mg/dL
Specific Gravity, Urine: 1.002 — ABNORMAL LOW (ref 1.005–1.030)
pH: 7 (ref 5.0–8.0)

## 2018-06-30 LAB — GASTROINTESTINAL PANEL BY PCR, STOOL (REPLACES STOOL CULTURE)
Adenovirus F40/41: NOT DETECTED
Astrovirus: NOT DETECTED
CAMPYLOBACTER SPECIES: NOT DETECTED
Cryptosporidium: NOT DETECTED
Cyclospora cayetanensis: NOT DETECTED
Entamoeba histolytica: NOT DETECTED
Enteroaggregative E coli (EAEC): NOT DETECTED
Enteropathogenic E coli (EPEC): NOT DETECTED
Enterotoxigenic E coli (ETEC): NOT DETECTED
GIARDIA LAMBLIA: NOT DETECTED
NOROVIRUS GI/GII: NOT DETECTED
PLESIMONAS SHIGELLOIDES: NOT DETECTED
ROTAVIRUS A: NOT DETECTED
SAPOVIRUS (I, II, IV, AND V): NOT DETECTED
SHIGA LIKE TOXIN PRODUCING E COLI (STEC): NOT DETECTED
SHIGELLA/ENTEROINVASIVE E COLI (EIEC): NOT DETECTED
Salmonella species: NOT DETECTED
Vibrio cholerae: NOT DETECTED
Vibrio species: NOT DETECTED
Yersinia enterocolitica: NOT DETECTED

## 2018-06-30 LAB — BASIC METABOLIC PANEL
Anion gap: 10 (ref 5–15)
BUN: 13 mg/dL (ref 8–23)
CHLORIDE: 105 mmol/L (ref 98–111)
CO2: 27 mmol/L (ref 22–32)
CREATININE: 0.64 mg/dL (ref 0.44–1.00)
Calcium: 9.2 mg/dL (ref 8.9–10.3)
GFR calc non Af Amer: >60 mL/min (ref >60)
Glucose, Bld: 103 mg/dL — ABNORMAL HIGH (ref 70–99)
POTASSIUM: 4.3 mmol/L (ref 3.5–5.1)
Sodium: 142 mmol/L (ref 135–145)

## 2018-06-30 LAB — INFLUENZA PANEL BY PCR (TYPE A & B)
INFLAPCR: NEGATIVE
INFLBPCR: NEGATIVE

## 2018-06-30 LAB — GROUP A STREP BY PCR: Group A Strep by PCR: NOT DETECTED

## 2018-06-30 LAB — CBC
HCT: 39.1 % (ref 36.0–46.0)
HEMOGLOBIN: 13.2 g/dL (ref 12.0–15.0)
MCH: 30.1 pg (ref 26.0–34.0)
MCHC: 33.8 g/dL (ref 30.0–36.0)
MCV: 89.1 fL (ref 80.0–100.0)
PLATELETS: 240 10*3/uL (ref 150–400)
RBC: 4.39 MIL/uL (ref 3.87–5.11)
RDW: 12.6 % (ref 11.5–15.5)
WBC: 6.9 10*3/uL (ref 4.0–10.5)
nRBC: 0 % (ref 0.0–0.2)

## 2018-06-30 LAB — C DIFFICILE QUICK SCREEN W PCR REFLEX
C DIFFICLE (CDIFF) ANTIGEN: NEGATIVE
C Diff interpretation: NOT DETECTED
C Diff toxin: NEGATIVE

## 2018-06-30 LAB — TROPONIN I

## 2018-06-30 MED ORDER — BENZONATATE 100 MG PO CAPS
200.0000 mg | ORAL_CAPSULE | Freq: Once | ORAL | Status: AC
  Administered 2018-06-30: 200 mg via ORAL
  Filled 2018-06-30: qty 2

## 2018-06-30 MED ORDER — CLONAZEPAM 0.5 MG PO TABS
0.5000 mg | ORAL_TABLET | Freq: Once | ORAL | Status: AC
  Administered 2018-06-30: 0.5 mg via ORAL
  Filled 2018-06-30: qty 1

## 2018-06-30 MED ORDER — METOPROLOL TARTRATE 25 MG PO TABS
25.0000 mg | ORAL_TABLET | Freq: Once | ORAL | Status: DC
  Filled 2018-06-30: qty 1

## 2018-06-30 MED ORDER — CARVEDILOL 6.25 MG PO TABS
6.2500 mg | ORAL_TABLET | Freq: Two times a day (BID) | ORAL | Status: DC
  Filled 2018-06-30: qty 1

## 2018-06-30 MED ORDER — LOSARTAN POTASSIUM 50 MG PO TABS
100.0000 mg | ORAL_TABLET | Freq: Once | ORAL | Status: AC
  Administered 2018-06-30: 100 mg via ORAL
  Filled 2018-06-30: qty 2

## 2018-06-30 MED ORDER — ACETAMINOPHEN 500 MG PO TABS
1000.0000 mg | ORAL_TABLET | Freq: Once | ORAL | Status: AC
  Administered 2018-06-30: 1000 mg via ORAL
  Filled 2018-06-30: qty 2

## 2018-06-30 MED ORDER — BENZONATATE 100 MG PO CAPS: 200.0000 mg | ORAL_CAPSULE | Freq: Three times a day (TID) | ORAL | 0 refills | Status: DC | PRN

## 2018-06-30 NOTE — ED Notes (Signed)
Pt unable to give stool sample

## 2018-06-30 NOTE — Discharge Instructions (Addendum)
Your symptoms are likely due to a virus, which your body will fight off on its own.  You may continue to have symptoms for days to weeks.  Please continue to take your Advair and Albuterol as needed.  You may also take Tessalon Perles for your cough.  Please talk to your primary care doctor about your anxiety, and continue to take your Clonazepam as prescribed.  Return to the emergency department if you develop fever >100.5, shortness of breath, lightheadedness or fainting, inability to keep down fluids, or any other symptoms concerning to you.

## 2018-06-30 NOTE — ED Provider Notes (Signed)
[facility] Emergency Department Provider Note  ____________________________________________  Time seen: Approximately 8:27 AM  I have reviewed the triage vital signs and the nursing notes.   HISTORY  Chief Complaint Shortness of Breath and Cough    HPI Andrea Ford is a 67 y.o. female with a history of asthma and COPD not on oxygen, HTN, anxiety, presenting with cough, decreased sleep, sweatiness and chills, chest pain and anxiety.  The patient was seen here 11/14 for similar symptoms and was discharged with a Z-Pak and prednisone.  She has completed the course, and is not feeling any better.  She reports that the prednisone is making her heart race and making it difficult for her to sleep.  She has been having sweatiness and chills but has not taken her temperature.  She continues to have a dry nonproductive cough without congestion or rhinorrhea or ear pain.  She has some mild sore throat.  She takes Advair daily, as well as albuterol nebulizers for her cough, which do help.  She reports that she has a central chest pain "like an elephant" which is worse if she presses on it and worse with cough but also present when she is not having cough.  It does not worsen with exertion.  She denies palpitations, shortness of breath, lightheadedness or syncope.  Over the last couple of days, the patient has developed a nonbloody mucousy diarrhea without abdominal pain, nausea or vomiting.  She states that she has baseline "gastrointestinal issues" which can give her diarrhea but this feels different than usual.  She also reports "I get very anxious when I am sick," and has been using clonazepam successfully as prescribed for her symptoms.  Past Medical History:  Diagnosis Date  . Asthma   . COPD (chronic obstructive pulmonary disease) ([place])   . Hypertension   . Migraine     Patient Active Problem List   Diagnosis Date Noted  . Accelerated hypertension 07/13/2016  . Chest  pain 07/13/2016  . Abnormal EKG 07/13/2016  . GAD (generalized anxiety disorder) 07/13/2016    Past Surgical History:  Procedure Laterality Date  . ANKLE SURGERY Right   . CARDIAC CATHETERIZATION Right 07/15/2016   Procedure: Left Heart Cath and Coronary Angiography;  Surgeon: Dionisio David, MD;  Location: [place] [facility];  Service: Cardiovascular;  Laterality: Right;  . CHOLECYSTECTOMY    . TUBAL LIGATION      Current Outpatient Rx  . Order #: [id number] Class: Historical Med  . Order #: [id number] Class: Print  . Order #: [id number] Class: Print  . Order #: [id number] Class: Historical Med  . Order #: [id number] Class: Historical Med  . Order #: [id number] Class: Historical Med  . Order #: [id number] Class: Historical Med  . Order #: [id number] Class: Historical Med  . Order #: [id number] Class: Historical Med  . Order #: [id number] Class: Print  . Order #: [id number] Class: Historical Med  . Order #: [id number] Class: Historical Med  . Order #: [id number] Class: Historical Med  . Order #: [id number] Class: Print  . Order #: [id number] Class: Print  . Order #: [id number] Class: Print  . Order #: [id number] Class: Print  . Order #: [id number] Class: Historical Med  . Order #: [id number] Class: Print  . Order #: [id number] Class: Print  . Order #: [id number] Class: Print    Allergies Prednisone; Diphenhydramine hcl; Other; Oxycodone-acetaminophen; Codeine; Diphenhydramine; Etodolac; Hydrocodone; Meperidine; and Venlafaxine  No family history on file.  Social History Social History   Tobacco Use  . Smoking status: Former Research scientist (life sciences)  . Smokeless tobacco: Never  Used  Substance Use Topics  . Alcohol use: No  . Drug use: No    Review of Systems Constitutional: Positive sweatiness and chills.  No lightheadedness or syncope. Eyes: No visual changes.  No eye discharge. ENT: Positive sore throat. No congestion or rhinorrhea. Cardiovascular: Positive chest pain. Denies palpitations. Respiratory: Denies  shortness of breath.  Positive cough. Gastrointestinal: No abdominal pain.  No nausea, no vomiting.  Positive diarrhea.  No constipation. Genitourinary: Negative for dysuria. Musculoskeletal: Negative for back pain. Skin: Negative for rash. Neurological: Negative for headaches. No focal numbness, tingling or weakness.  Psych: As of anxiety   ____________________________________________   PHYSICAL EXAM:  VITAL SIGNS: ED Triage Vitals  Enc Vitals Group     BP 06/30/18 0818 (!) 145/100     Pulse Rate 06/30/18 0818 93     Resp 06/30/18 0818 20     Temp --      Temp src --      SpO2 06/30/18 0818 97 %     Weight 06/30/18 0804 154 lb (69.9 kg)     Height 06/30/18 0804 5' 1.5" (1.562 m)     Head Circumference --      Peak Flow --      Pain Score --      Pain Loc --      Pain Edu? --      Excl. in [place]? --     Constitutional: Alert and oriented. Answers questions appropriately.  Anxious appearing and intermittently coughing. Eyes: Conjunctivae are normal.  EOMI. No scleral icterus.  No eye discharge. Head: Atraumatic. EARS: TMs are clear without bulge, erythema or fluid bilaterally.  The TMs are clear bilaterally as well. Nose: No congestion/rhinnorhea. Mouth/Throat: Mucous membranes are moist.  The patient has mild posterior pharyngeal erythema without tonsillar swelling or exudate.  The posterior palate is symmetric and the uvula is midline.  No stridor, hoarse voice, or drooling. Neck: No stridor.  Supple.  No JVD.  No meningismus. Cardiovascular: Normal rate, regular rhythm. No murmurs, rubs or gallops.  Respiratory: Normal respiratory effort.  No accessory muscle use or retractions. Lungs CTAB.  No wheezes, rales or ronchi. Gastrointestinal: Soft, nontender and nondistended.  No guarding or rebound.  No peritoneal signs. Musculoskeletal: No LE edema. No ttp in the calves or palpable cords.  Negative Homan's sign. Neurologic:  A&Ox3.  Speech is clear.  Face and smile are  symmetric.  EOMI.  Moves all extremities well. Skin:  Skin is warm, dry and intact. No rash noted. Psychiatric: Mood and affect are anxious.  ____________________________________________   LABS (all labs ordered are listed, but only abnormal results are displayed)  Labs Reviewed  URINALYSIS, COMPLETE (UACMP) WITH MICROSCOPIC - Abnormal; Notable for the following components:      Result Value   Color, Urine STRAW (*)    APPearance CLEAR (*)    Specific Gravity, Urine 1.002 (*)    All other components within normal limits  BASIC METABOLIC PANEL - Abnormal; Notable for the following components:   Glucose, Bld 103 (*)    All other components within normal limits  GROUP A STREP BY PCR  GASTROINTESTINAL PANEL BY PCR, STOOL (REPLACES STOOL CULTURE)  C DIFFICILE QUICK SCREEN W PCR REFLEX  INFLUENZA PANEL BY PCR (TYPE A & B)  CBC  TROPONIN I  URINALYSIS, COMPLETE (UACMP) WITH MICROSCOPIC   ____________________________________________  EKG  ED ECG REPORT I, Anne-Caroline Mariea Clonts, the attending physician, personally viewed and interpreted this ECG.  Date: 06/30/2018  EKG Time: 815  Rate: 93  Rhythm: normal sinus rhythm  Axis: normal  Intervals:none  ST&T Change: No STEMI  ____________________________________________  RADIOLOGY  Dg Chest 2 View  Result Date: 06/30/2018 CLINICAL DATA:  Cough, chest pressure EXAM: CHEST - 2 VIEW COMPARISON:  06/25/2018 FINDINGS: Lungs are clear.  No pleural effusion or pneumothorax. The heart is normal in size. Visualized osseous structures are within normal limits. IMPRESSION: Normal chest radiographs. Electronically Signed   By: Julian Hy M.D.   On: 06/30/2018 09:03    ____________________________________________   PROCEDURES  Procedure(s) performed: None  Procedures  Critical Care performed: No ____________________________________________   INITIAL IMPRESSION / ASSESSMENT AND PLAN / ED COURSE  Pertinent labs & imaging  results that were available during my care of the patient were reviewed by me and considered in my medical decision making (see chart for details).  67 y.o. female with asthma and COPD presenting with ongoing cough, chest pain.  Overall, the patient is hypertensive here but states she has not taken her morning antihypertensives I have ordered these.  Her symptoms are most consistent with a viral URI, and I have had a long discussion with her about the likelihood that the antibiotics did not help because they do not treat viral infections.  We will get a chest x-ray to rule out bacterial overgrowth/pneumonia.  Strep test and influenza testing have also been ordered.  The patient is also having chest pain so we will get a troponin; her EKG does not show any ischemic changes and the most likely etiology of her chest pain is musculoskeletal strain from her cough/costochondritis.  In addition, the patient does have some diarrhea, which may be a side effect from her antibiotic use.  We will get stool studies including C. difficile.  We will also obtain a urinalysis.  Here, the patient will receive Tessalon Perles, as well as pain control for her chest.  PE is considered but very unlikely.  Plan reevaluation for final disposition.  ED Course:  The patient continues to be hemodynamically stable and was able to ambulate with pulse oximeter readings remaining in the 90's.  The pt does not have a pna clinically or on CXR, strep and influenza testing are negative.  Cardiac work up is reassuring.  At this time, the patient is safe for discharge home.  We have had an expectant management discussion regarding her cough, chest pain and anxiety.  Return precautions and f/u instructions were discussed.  ____________________________________________  FINAL CLINICAL IMPRESSION(S) / ED DIAGNOSES  Final diagnoses:  Costochondritis, acute  Diarrhea, unspecified type  Viral URI with cough  Anxiety         NEW  MEDICATIONS STARTED DURING THIS VISIT:  New Prescriptions   BENZONATATE (TESSALON PERLES) 100 MG CAPSULE    Take 2 capsules (200 mg total) by mouth 3 (three) times daily as needed for cough.      Eula Listen, MD 06/30/18 1025

## 2018-06-30 NOTE — ED Triage Notes (Signed)
Pt c/o cough and chest pressure, pt seen here 5 days ago for the same.  States it is difficult to sleep, Pt was prescribed antibiotics, but states she is not any better.

## 2018-06-30 NOTE — ED Notes (Signed)
Ambulated pt. Pt satting between 90-95 on r/a while walking.

## 2018-06-30 NOTE — ED Notes (Addendum)
FIRST NURSE NOTE:  Pt seen 5 days ago for cough and cold sxs, pt states she is not feeling any better and continues to have a cough, states it feels like an elephant is on her chest.  Pt is able to speak in complete sentences without running out of breath. Pt has mask applied on arrival.

## 2018-07-14 DIAGNOSIS — Z789 Other specified health status: Secondary | ICD-10-CM | POA: Insufficient documentation

## 2018-08-12 ENCOUNTER — Emergency Department
Admission: EM | Admit: 2018-08-12 | Discharge: 2018-08-12 | Disposition: A | Discharge status: Home / Self Care | Payer: Medicare Other | Attending: Emergency Medicine | Admitting: Emergency Medicine

## 2018-08-12 ENCOUNTER — Encounter: Payer: Self-pay | Admitting: Emergency Medicine

## 2018-08-12 ENCOUNTER — Emergency Department: Payer: Medicare Other

## 2018-08-12 ENCOUNTER — Other Ambulatory Visit: Payer: Self-pay

## 2018-08-12 DIAGNOSIS — J449 Chronic obstructive pulmonary disease, unspecified: Secondary | ICD-10-CM | POA: Diagnosis not present

## 2018-08-12 DIAGNOSIS — Z79899 Other long term (current) drug therapy: Secondary | ICD-10-CM | POA: Diagnosis not present

## 2018-08-12 DIAGNOSIS — R42 Dizziness and giddiness: Secondary | ICD-10-CM

## 2018-08-12 DIAGNOSIS — F419 Anxiety disorder, unspecified: Secondary | ICD-10-CM

## 2018-08-12 DIAGNOSIS — R413 Other amnesia: Secondary | ICD-10-CM | POA: Diagnosis not present

## 2018-08-12 DIAGNOSIS — I10 Essential (primary) hypertension: Secondary | ICD-10-CM | POA: Diagnosis not present

## 2018-08-12 DIAGNOSIS — Z87891 Personal history of nicotine dependence: Secondary | ICD-10-CM | POA: Diagnosis not present

## 2018-08-12 DIAGNOSIS — Z9049 Acquired absence of other specified parts of digestive tract: Secondary | ICD-10-CM | POA: Diagnosis not present

## 2018-08-12 DIAGNOSIS — J45909 Unspecified asthma, uncomplicated: Secondary | ICD-10-CM | POA: Diagnosis not present

## 2018-08-12 DIAGNOSIS — R202 Paresthesia of skin: Secondary | ICD-10-CM | POA: Diagnosis not present

## 2018-08-12 DIAGNOSIS — R410 Disorientation, unspecified: Secondary | ICD-10-CM | POA: Diagnosis present

## 2018-08-12 LAB — PROTIME-INR
INR: 0.91
Prothrombin Time: 12.2 seconds (ref 11.4–15.2)

## 2018-08-12 LAB — URINALYSIS, COMPLETE (UACMP) WITH MICROSCOPIC
Bacteria, UA: NONE SEEN
Bilirubin Urine: NEGATIVE
Glucose, UA: NEGATIVE mg/dL
Ketones, ur: NEGATIVE mg/dL
Nitrite: NEGATIVE
Protein, ur: NEGATIVE mg/dL
Specific Gravity, Urine: 1.021 (ref 1.005–1.030)
pH: 6 (ref 5.0–8.0)

## 2018-08-12 LAB — DIFFERENTIAL
Abs Immature Granulocytes: 0.01 10*3/uL (ref 0.00–0.07)
Basophils Absolute: 0 10*3/uL (ref 0.0–0.1)
Basophils Relative: 1 %
Eosinophils Absolute: 0.2 10*3/uL (ref 0.0–0.5)
Eosinophils Relative: 3 %
Immature Granulocytes: 0 %
Lymphocytes Relative: 37 %
Lymphs Abs: 2 10*3/uL (ref 0.7–4.0)
Monocytes Absolute: 0.4 10*3/uL (ref 0.1–1.0)
Monocytes Relative: 7 %
Neutro Abs: 2.8 10*3/uL (ref 1.7–7.7)
Neutrophils Relative %: 52 %

## 2018-08-12 LAB — CBC
HCT: 44.8 % (ref 36.0–46.0)
Hemoglobin: 14.4 g/dL (ref 12.0–15.0)
MCH: 29.9 pg (ref 26.0–34.0)
MCHC: 32.1 g/dL (ref 30.0–36.0)
MCV: 93.1 fL (ref 80.0–100.0)
Platelets: 232 10*3/uL (ref 150–400)
RBC: 4.81 MIL/uL (ref 3.87–5.11)
RDW: 11.9 % (ref 11.5–15.5)
WBC: 5.5 10*3/uL (ref 4.0–10.5)
nRBC: 0 % (ref 0.0–0.2)

## 2018-08-12 LAB — COMPREHENSIVE METABOLIC PANEL
ALT: 22 U/L (ref 0–44)
AST: 27 U/L (ref 15–41)
Albumin: 4.3 g/dL (ref 3.5–5.0)
Alkaline Phosphatase: 66 U/L (ref 38–126)
Anion gap: 10 (ref 5–15)
BUN: 18 mg/dL (ref 8–23)
CHLORIDE: 105 mmol/L (ref 98–111)
CO2: 26 mmol/L (ref 22–32)
Calcium: 9.4 mg/dL (ref 8.9–10.3)
Creatinine, Ser: 0.99 mg/dL (ref 0.44–1.00)
GFR calc Af Amer: >60 mL/min (ref >60)
GFR, EST NON AFRICAN AMERICAN: 60 mL/min — AB (ref >60)
Glucose, Bld: 116 mg/dL — ABNORMAL HIGH (ref 70–99)
Potassium: 3.6 mmol/L (ref 3.5–5.1)
Sodium: 141 mmol/L (ref 135–145)
Total Bilirubin: 1 mg/dL (ref 0.3–1.2)
Total Protein: 7.9 g/dL (ref 6.5–8.1)

## 2018-08-12 LAB — TROPONIN I

## 2018-08-12 LAB — GLUCOSE, CAPILLARY: Glucose-Capillary: 119 mg/dL — ABNORMAL HIGH (ref 70–99)

## 2018-08-12 LAB — APTT: aPTT: <24 seconds — ABNORMAL LOW (ref 24–36)

## 2018-08-12 MED ORDER — FOSFOMYCIN TROMETHAMINE 3 G PO PACK
3.0000 g | PACK | Freq: Once | ORAL | Status: AC
  Administered 2018-08-12: 3 g via ORAL
  Filled 2018-08-12: qty 3

## 2018-08-12 MED ORDER — LORAZEPAM 2 MG/ML IJ SOLN
0.5000 mg | Freq: Once | INTRAMUSCULAR | Status: AC
  Administered 2018-08-12: 0.5 mg via INTRAVENOUS
  Filled 2018-08-12: qty 1

## 2018-08-12 MED ORDER — LORAZEPAM 2 MG/ML IJ SOLN
1.0000 mg | Freq: Once | INTRAMUSCULAR | Status: AC
  Administered 2018-08-12: 1 mg via INTRAVENOUS
  Filled 2018-08-12: qty 1

## 2018-08-12 NOTE — ED Triage Notes (Signed)
1533-dr quale saw pt   1536-in CT  Pt has severe anxiety. Have been attempting to convince pt to do CT.  Pt continues to request anxiety medication then wait for CT scan.  Pt shown what CT will do prior. Pt still not cooperating for CT.  CT staff talking to pt and pt shown window where RN and CT staff will wait. Pt refusing CT

## 2018-08-12 NOTE — ED Provider Notes (Signed)
-----------------------------------------   11:25 PM on 08/12/2018 -----------------------------------------  Urinalysis shows possible urinary tract infection.  We will send a urine culture.  We will dose a one-time dose of fosfomycin.  Patient will be discharged.  Continues to appear well.   Harvest Dark, MD 08/12/18 2325

## 2018-08-12 NOTE — ED Triage Notes (Signed)
Per daughter pt could not remember anything about 30 minutes ago. Did not know who anyone was.  Pt was trying to unlock her car with her phone.  Pt reports started feeling dizzy today at 1400.  Still feels dizzy.  Pt now able to answer questions.  Pt states "I don't feel normal".  Denies numbness or tingling.  Pt very anxious.

## 2018-08-12 NOTE — Consult Note (Signed)
Referring Physician: Jacqualine Code    Chief Complaint: Lightheadedness, amnesia  HPI: Andrea Ford is an 68 y.o. female who reports that today after eating she developed lightheadedness.  She later became confused.  Could not remember the names of her family members.  Could not remember how to use her phone.  Tried to get in her car with her cell phone instead of her keys.  Patient is able to provide all of this history.  Initial NIHSS of 0.   Patient also reports a left sided headache.  Memory has improved but continues to feel lightheaded.   Patient scheduled to have surgery on Friday and very nervous about this.    Date last known well: Date: 08/12/2018 Time last known well: Time: 14:00 tPA Given: No: Improvement in symptoms  Past Medical History:  Diagnosis Date  . Asthma   . COPD (chronic obstructive pulmonary disease) ([place])   . Hypertension   . Migraine     Past Surgical History:  Procedure Laterality Date  . ANKLE SURGERY Right   . CARDIAC CATHETERIZATION Right 07/15/2016   Procedure: Left Heart Cath and Coronary Angiography;  Surgeon: Dionisio David, MD;  Location: [facility];  Service: Cardiovascular;  Laterality: Right;  . CHOLECYSTECTOMY    . TUBAL LIGATION      Family history:  Daughter with migraine and fibroid.  Mother deceased from ovarian cancer.    Social History:  reports that she has quit smoking. She has never used smokeless tobacco. She reports that she does not drink alcohol or use drugs.  Allergies:  Allergies  Allergen Reactions  . Prednisone Rash and Other (See Comments)    hyper   . Diphenhydramine Hcl Other (See Comments)  . Other Other (See Comments)    Pet dander  . Oxycodone-Acetaminophen   . Codeine Itching and Other (See Comments)  . Diphenhydramine Anxiety  . Etodolac Rash, Nausea And Vomiting and Other (See Comments)  . Hydrocodone Itching  . Meperidine Rash and Other (See Comments)    Pt. Not aware  Of ever having any demerol    . Venlafaxine Rash and Other (See Comments)    Medications: I have reviewed the patient's current medications. Prior to Admission:  Prior to Admission medications   Medication Sig Start Date End Date Taking? Authorizing Provider  albuterol (PROVENTIL HFA;VENTOLIN HFA) 108 (90 BASE) MCG/ACT inhaler Inhale into the lungs every 6 (six) hours as needed for wheezing or shortness of breath.    [provider]  albuterol (PROVENTIL) (2.5 MG/3ML) 0.083% nebulizer solution Take 3 mLs (2.5 mg total) by nebulization every 6 (six) hours as needed for wheezing or shortness of breath. 06/25/18   Carrie Mew, MD  aspirin 81 MG chewable tablet Chew 1 tablet (81 mg total) by mouth daily. Patient not taking: Reported on 06/25/2018 07/15/16   Demetrios Loll, MD  atorvastatin (LIPITOR) 40 MG tablet Take 1 tablet (40 mg total) by mouth daily at 6 PM. Patient taking differently: Take 80 mg by mouth daily at 6 PM.  07/15/16   Demetrios Loll, MD  carvedilol (COREG) 6.25 MG tablet Take 6.25 mg by mouth at bedtime.    [provider]  Cholecalciferol (VITAMIN D-3 PO) Take 1 tablet by mouth daily.    [provider]  citalopram (CELEXA) 20 MG tablet Take 20 mg by mouth daily.  05/02/16   [provider]  clonazePAM (KLONOPIN) 0.5 MG tablet Take 0.25 mg by mouth 2 (two) times daily as  needed for anxiety.    [provider]  diclofenac sodium (VOLTAREN) 1 % GEL Apply 2 g topically 4 (four) times daily. 02/25/12 03/03/19  [provider]  dicyclomine (BENTYL) 20 MG tablet Take 20 mg by mouth daily as needed for spasms.    [provider]  famotidine (PEPCID) 40 MG tablet Take 1 tablet (40 mg total) by mouth every evening. 11/24/16 06/30/18  Nance Pear, MD  hydrOXYzine (ATARAX/VISTARIL) 25 MG tablet Take 25 mg by mouth daily as needed. 05/02/16   [provider]  losartan (COZAAR) 100 MG tablet Take 100 mg by mouth daily. 02/01/16 06/30/18  [provider]  metoprolol tartrate (LOPRESSOR) 25 MG tablet Take 1 tablet (25 mg total) by mouth 2 (two) times daily. Patient not taking: Reported on 06/25/2018 07/14/16   Demetrios Loll, MD  nitroGLYCERIN (NITROSTAT) 0.4 MG SL tablet Place 1 tablet (0.4 mg total) under the tongue every 5 (five) minutes as needed for chest pain. Patient not taking: Reported on 06/25/2018 07/14/16   Demetrios Loll, MD  rizatriptan (MAXALT-MLT) 10 MG disintegrating tablet Take 10 mg by mouth daily as needed. 12/04/17   [provider]  traZODone (DESYREL) 50 MG tablet Take 50 mg by mouth at bedtime as needed for sleep.    [provider]    ROS: History obtained from the patient  General ROS: negative for - chills, fatigue, fever, night sweats, weight gain or weight loss Psychological ROS: anxiety Ophthalmic ROS: negative for - blurry vision, double vision, eye pain or loss of vision ENT ROS: negative for - epistaxis, nasal discharge, oral lesions, sore throat, tinnitus or vertigo Allergy and Immunology ROS: negative for - hives or itchy/watery eyes Hematological and Lymphatic ROS: negative for - bleeding problems, bruising or swollen lymph nodes Endocrine ROS: negative for - galactorrhea, hair pattern changes, polydipsia/polyuria or temperature intolerance Respiratory ROS: negative for - cough, hemoptysis, shortness of breath or wheezing Cardiovascular ROS: negative for - chest pain, dyspnea on exertion, edema or irregular heartbeat Gastrointestinal ROS: negative for - abdominal pain, diarrhea, hematemesis, nausea/vomiting or stool incontinence Genito-Urinary ROS: negative for - dysuria, hematuria, incontinence or urinary frequency/urgency Musculoskeletal ROS: left neck pain Neurological ROS: as noted in HPI Dermatological ROS: negative for rash and skin lesion changes  Physical Examination: Blood pressure (!) 169/92, pulse (!) 108, temperature 97.6 F (36.4 C), temperature source Oral, resp. rate  18, height 5' 1.5" (1.562 m), weight 71.7 kg, SpO2 100 %.  HEENT-  Normocephalic, no lesions, without obvious abnormality.  Normal external eye and conjunctiva.  Normal TM's bilaterally.  Normal auditory canals and external ears. Normal external nose, mucus membranes and septum.  Normal pharynx. Cardiovascular- S1, S2 normal, pulses palpable throughout   Lungs- chest clear, no wheezing, rales, normal symmetric air entry Abdomen- soft, non-tender; bowel sounds normal; no masses,  no organomegaly Extremities- no edema Lymph-no adenopathy palpable Musculoskeletal-no joint tenderness, deformity or swelling Skin-small areas of eccyhmoses on arms bilaterally  Neurological Examination   Mental Status: Alert, oriented, thought content appropriate.  Speech fluent without evidence of aphasia.  Able to follow 3 step commands without difficulty. Cranial Nerves: II: Discs flat bilaterally; Visual fields grossly normal, pupils equal, round, reactive to light and accommodation III,IV, VI: ptosis not present, extra-ocular motions intact bilaterally V,VII: smile symmetric, facial light touch sensation normal bilaterally VIII: hearing normal bilaterally IX,X: gag reflex present XI: bilateral shoulder shrug XII: midline tongue extension Motor: Right : Upper extremity   5/5  Left:     Upper extremity   5/5  Lower extremity   5/5     Lower extremity   5/5 Tone and bulk:normal tone throughout; no atrophy noted Sensory: Pinprick and light touch intact throughout, bilaterally Deep Tendon Reflexes: Symmetric throughout Plantars: Right: downgoing   Left: downgoing Cerebellar: Normal finger-to-nose and normal heel-to-shin testing bilaterally Gait: normal gait and station   Laboratory Studies:  Basic Metabolic Panel: No results for input(s): NA, K, CL, CO2, GLUCOSE, BUN, CREATININE, CALCIUM, MG, PHOS in the last 168 hours.  Liver Function Tests: No results for input(s): AST, ALT, ALKPHOS, BILITOT, PROT,  ALBUMIN in the last 168 hours. No results for input(s): LIPASE, AMYLASE in the last 168 hours. No results for input(s): AMMONIA in the last 168 hours.  CBC: No results for input(s): WBC, NEUTROABS, HGB, HCT, MCV, PLT in the last 168 hours.  Cardiac Enzymes: No results for input(s): CKTOTAL, CKMB, CKMBINDEX, TROPONINI in the last 168 hours.  BNP: Invalid input(s): POCBNP  CBG: Recent Labs  Lab 08/12/18 1557  GLUCAP 119*    Microbiology: Results for orders placed or performed during the hospital encounter of 06/30/18  Group A Strep by PCR ([place] Only)     Status: None   Collection Time: 06/30/18  9:14 AM  Result Value Ref Range Status   Group A Strep by PCR NOT DETECTED NOT DETECTED Final    Comment: Performed at [facility], [address] [place], [place] [address]  Gastrointestinal Panel by PCR , Stool     Status: None   Collection Time: 06/30/18  9:14 AM  Result Value Ref Range Status   Campylobacter species NOT DETECTED NOT DETECTED Final   Plesimonas shigelloides NOT DETECTED NOT DETECTED Final   Salmonella species NOT DETECTED NOT DETECTED Final   Yersinia enterocolitica NOT DETECTED NOT DETECTED Final   Vibrio species NOT DETECTED NOT DETECTED Final   Vibrio cholerae NOT DETECTED NOT DETECTED Final   Enteroaggregative E coli (EAEC) NOT DETECTED NOT DETECTED Final   Enteropathogenic E coli (EPEC) NOT DETECTED NOT DETECTED Final   Enterotoxigenic E coli (ETEC) NOT DETECTED NOT DETECTED Final   Shiga like toxin producing E coli (STEC) NOT DETECTED NOT DETECTED Final   Shigella/Enteroinvasive E coli (EIEC) NOT DETECTED NOT DETECTED Final   Cryptosporidium NOT DETECTED NOT DETECTED Final   Cyclospora cayetanensis NOT DETECTED NOT DETECTED Final   Entamoeba histolytica NOT DETECTED NOT DETECTED Final   Giardia lamblia NOT DETECTED NOT DETECTED Final   Adenovirus F40/41 NOT DETECTED NOT DETECTED Final   Astrovirus NOT DETECTED NOT DETECTED Final   Norovirus  GI/GII NOT DETECTED NOT DETECTED Final   Rotavirus A NOT DETECTED NOT DETECTED Final   Sapovirus (I, II, IV, and V) NOT DETECTED NOT DETECTED Final    Comment: Performed at [facility], [address] [place], [place] [address]  C Difficile Quick Screen w PCR reflex     Status: None   Collection Time: 06/30/18  9:14 AM  Result Value Ref Range Status   C Diff antigen NEGATIVE NEGATIVE Final   C Diff toxin NEGATIVE NEGATIVE Final   C Diff interpretation No C. difficile detected.  Final    Comment: Performed at [facility], [address] [place], [place] [address]    Coagulation Studies: No results for input(s): LABPROT, INR in the last 72 hours.  Urinalysis: No results for input(s): COLORURINE, LABSPEC, PHURINE, GLUCOSEU, HGBUR, BILIRUBINUR, KETONESUR, PROTEINUR, UROBILINOGEN, NITRITE, LEUKOCYTESUR in the  last 168 hours.  Invalid input(s): APPERANCEUR  Lipid Panel:    Component Value Date/Time   CHOL 210 (H) 07/15/2016 0042   TRIG 118 07/15/2016 0042   HDL 61 07/15/2016 0042   CHOLHDL 3.4 07/15/2016 0042   VLDL 24 07/15/2016 0042   LDLCALC 125 (H) 07/15/2016 0042    HgbA1C: No results found for: HGBA1C  Urine Drug Screen:  No results found for: LABOPIA, COCAINSCRNUR, LABBENZ, AMPHETMU, THCU, LABBARB  Alcohol Level: No results for input(s): ETH in the last 168 hours.   Imaging: No results found.  Assessment: 68 y.o. female presenting with complaints of lightheadedness and amnesia.  Patient improved.  No longer with amnesia and able to recall events of the day.  Unable to do a CT on presentation as patient reported that she was too anxious to have a CT until her family was present.  Family unable to be located initially.  Patient is requesting sedation for CT.  At this point no evidence of stroke.  NIHSS 0 and no intervention indicated.  Suspect this presentation related to anxiety.    Stroke Risk Factors - hypertension  Plan: 1. Head CT without  contrast 2. Prophylactic therapy-Continue ASA 3. NPO until RN stroke swallow screen 4. Telemetry monitoring 5. Frequent neuro checks 6. If patient does not return to baseline will require admission for echocardiogram, carotid dopplers and EEG.  Do not suspect that patient will be able to tolerate MRI due to measures we are having to take to get head CT.    Case discussed with Dr. Fortino Sic, MD Neurology [phone] 08/12/2018, 4:11 PM

## 2018-08-12 NOTE — ED Notes (Signed)
Code  Stroke  Called  To [phone] 

## 2018-08-12 NOTE — ED Provider Notes (Addendum)
[facility] Emergency Department Provider Note   ____________________________________________   First MD Initiated Contact with Patient 08/12/18 1547     (approximate)  I have reviewed the triage vital signs and the nursing notes.   HISTORY  Chief Complaint Episode of confusion    HPI Andrea Ford is a 68 y.o. female tells me that she started having trouble remembering things about an hour ago.  She did not know who people were.  She reports that she seemed confused.  She felt a little dizzy today earlier at 2:00.  Patient reports she feels very anxious.  She reports to me she is worried about her blood pressure, she is also worried she could be having a stroke, she also worries that she gets very anxious around any type of medical evaluation or CT scan.  Patient and her daughter now present the bedside, both report that she was out at the mall shopping she started to tell him that she does not quite feel well and then they noticed she seemed to be confused, was slow to remember who she was with, tried to unlock her car briefly with her cell phone.  She then reports she felt very anxious, they brought her here.  Patient reports she feels better now.  She knows her daughter is, her grandkids and her boyfriend are at the bedside.  She denies any chest pain.  No shortness of breath.  No headache.  She does report feeling anxious knowing that she is to get a CT scan, but feels better knowing that the neurologist told her she is not having a stroke.  Denies any recent infection.  No numbness tingling or weakness that is associated with this except for a tingling feeling in both fingers and her toes that occurred during the event.  She does reports that she suffers from fairly disabling anxiety, takes clonazepam each evening.   Past Medical History:  Diagnosis Date  . Asthma   . COPD (chronic obstructive pulmonary disease) ([place])   . Hypertension   . Migraine      Patient Active Problem List   Diagnosis Date Noted  . Accelerated hypertension 07/13/2016  . Chest pain 07/13/2016  . Abnormal EKG 07/13/2016  . GAD (generalized anxiety disorder) 07/13/2016    Past Surgical History:  Procedure Laterality Date  . ANKLE SURGERY Right   . CARDIAC CATHETERIZATION Right 07/15/2016   Procedure: Left Heart Cath and Coronary Angiography;  Surgeon: Dionisio David, MD;  Location: [facility];  Service: Cardiovascular;  Laterality: Right;  . CHOLECYSTECTOMY    . TUBAL LIGATION      Prior to Admission medications   Medication Sig Start Date End Date Taking? Authorizing Provider  albuterol (PROVENTIL HFA;VENTOLIN HFA) 108 (90 BASE) MCG/ACT inhaler Inhale into the lungs every 6 (six) hours as needed for wheezing or shortness of breath.   Yes [provider]  albuterol (PROVENTIL) (2.5 MG/3ML) 0.083% nebulizer solution Take 3 mLs (2.5 mg total) by nebulization every 6 (six) hours as needed for wheezing or shortness of breath. 06/25/18  Yes Carrie Mew, MD  atorvastatin (LIPITOR) 40 MG tablet Take 1 tablet (40 mg total) by mouth daily at 6 PM. Patient taking differently: Take 80 mg by mouth daily at 6 PM.  07/15/16  Yes Demetrios Loll, MD  carvedilol (COREG) 6.25 MG tablet Take 6.25 mg by mouth at bedtime.   Yes [provider]  Cholecalciferol (VITAMIN D-3 PO) Take 1 tablet by  mouth daily.   Yes [provider]  citalopram (CELEXA) 20 MG tablet Take 20 mg by mouth daily.  05/02/16  Yes [provider]  clonazePAM (KLONOPIN) 0.5 MG tablet Take 0.25 mg by mouth 2 (two) times daily as needed for anxiety.   Yes [provider]  famotidine (PEPCID) 40 MG tablet Take 1 tablet (40 mg total) by mouth every evening. 11/24/16 08/12/18 Yes Nance Pear, MD  losartan (COZAAR) 100 MG tablet Take 100 mg by mouth daily. 02/01/16 08/12/18 Yes [provider]  rizatriptan (MAXALT-MLT) 10 MG disintegrating tablet Take 10  mg by mouth daily as needed. 12/04/17  Yes [provider]  traZODone (DESYREL) 50 MG tablet Take 50 mg by mouth at bedtime as needed for sleep.   Yes [provider]  diclofenac sodium (VOLTAREN) 1 % GEL Apply 2 g topically 4 (four) times daily. 02/25/12 03/03/19  [provider]  dicyclomine (BENTYL) 20 MG tablet Take 20 mg by mouth daily as needed for spasms.    [provider]  hydrOXYzine (ATARAX/VISTARIL) 25 MG tablet Take 25 mg by mouth daily as needed. 05/02/16   [provider]    Allergies Prednisone; Diphenhydramine hcl; Other; Oxycodone-acetaminophen; Codeine; Diphenhydramine; Etodolac; Hydrocodone; Meperidine; and Venlafaxine  History reviewed. No pertinent family history.  Social History Social History   Tobacco Use  . Smoking status: Former Research scientist (life sciences)  . Smokeless tobacco: Never Used  Substance Use Topics  . Alcohol use: No  . Drug use: No    Review of Systems Constitutional: No fever/chills Eyes: No visual changes. ENT: No sore throat. Cardiovascular: Denies chest pain. Respiratory: Denies shortness of breath. Gastrointestinal: No abdominal pain.   Genitourinary: Negative for dysuria. Musculoskeletal: Negative for back pain. Skin: Negative for rash. Neurological: Negative for headaches, areas of focal weakness or numbness.  See HPI    ____________________________________________   PHYSICAL EXAM:  VITAL SIGNS: ED Triage Vitals  Enc Vitals Group     BP      Pulse      Resp      Temp      Temp src      SpO2      Weight      Height      Head Circumference      Peak Flow      Pain Score      Pain Loc      Pain Edu?      Excl. in [place]?    Vitals:   08/12/18 1558  BP: (!) 169/92  Pulse: (!) 108  Resp: 18  Temp: 97.6 F (36.4 C)  SpO2: 100%     Constitutional: Alert and oriented.  Anxious appearing.  No distress. Eyes: Conjunctivae are normal. Head: Atraumatic. Nose: No  congestion/rhinnorhea. Mouth/Throat: Mucous membranes are moist. Neck: No stridor.  Cardiovascular: Slightly tachycardic rate, regular rhythm. Grossly normal heart sounds.  Good peripheral circulation. Respiratory: Normal respiratory effort.  No retractions. Lungs CTAB. Gastrointestinal: Soft and nontender. No distention. Musculoskeletal: No lower extremity tenderness nor edema. Neurologic:  Normal speech and language. No gross focal neurologic deficits are appreciated.  Clear speech.  Normal strength and sensation in all extremities.  Normal cranial nerve exam.  NIH score 0 Skin:  Skin is warm, dry and intact. No rash noted. Psychiatric: Mood and affect are normal. Speech and behavior are normal.  ____________________________________________   LABS (all labs ordered are listed, but only abnormal results are displayed)  Labs Reviewed  URINE CULTURE -  Abnormal; Notable for the following components:      Result Value   Culture   (*)    Value: <10,000 COLONIES/mL INSIGNIFICANT GROWTH Performed at [facility], [place] [address], [place],  [place] [address]    All other components within normal limits  APTT - Abnormal; Notable for the following components:   aPTT <24 (*)    All other components within normal limits  COMPREHENSIVE METABOLIC PANEL - Abnormal; Notable for the following components:   Glucose, Bld 116 (*)    GFR calc non Af Amer 60 (*)    All other components within normal limits  GLUCOSE, CAPILLARY - Abnormal; Notable for the following components:   Glucose-Capillary 119 (*)    All other components within normal limits  URINALYSIS, COMPLETE (UACMP) WITH MICROSCOPIC - Abnormal; Notable for the following components:   Color, Urine YELLOW (*)    APPearance CLEAR (*)    Hgb urine dipstick SMALL (*)    Leukocytes, UA SMALL (*)    All other components within normal limits  PROTIME-INR  CBC  DIFFERENTIAL  TROPONIN I  CBG MONITORING, ED    ____________________________________________  EKG  Reviewed entered by me at 1555 Heart rate 110 QRS 90 QTc 460 Sinus tachycardia, minimal nonspecific possibly rate related T wave changes present. ____________________________________________  RADIOLOGY  No results found.  CT head reviewed by me, negative for acute ____________________________________________   PROCEDURES  Procedure(s) performed: None  Procedures  Critical Care performed: No  ____________________________________________   INITIAL IMPRESSION / ASSESSMENT AND PLAN / ED COURSE  Pertinent labs & imaging results that were available during my care of the patient were reviewed by me and considered in my medical decision making (see chart for details).   Patient returns for evaluation after she became currently acutely confused.  She reports she is feeling better now, clearly recognizes her family members.  Reports that she had an episode where she felt like she could not recognize her family members, was acting confused.  She does report she suffers from severe anxiety, takes Klonopin at home for this and reports he is feeling better by the time I evaluated except very anxious about needing a CT scan.  He also reports severe claustrophobia, she is been very anxious she says recently as she has a plan for surgery on Friday and she has been thinking a lot about this make her very anxious.  Clinical Course as of Aug 15 53  Wed Aug 12, 2018  1547 Initially evaluated the patient as she went through triage prior to CT. Stable for CT.  Dr. Doy Mince and patient currently in her treatment room being assessed by neurology.    [MQ]  0093 NIH zero. Awaiting CT head, unable to obtain due to severe anxiety around CT scan. Seen by Dr. Doy Mince.    [MQ]  2324 Urinalysis, Complete w Microscopic [KP]    Clinical Course User Index [KP] Harvest Dark, MD [MQ] Delman Kitten, MD    Patient will be seen eval by  neurology.  No associated cardiac or pulmonary symptoms.  No infectious etiology.  Very reassuring clinical examination.  Seen by Dr. Doy Mince, she advises patient to be discharged to continue on her home medications likely due to anxiety state, and I suspect same.  910pm-- Patient resting comfortably, reports she feels well.  No further confusion.  Understanding and agreement with plan to follow-up with her primary care doctor.  Ongoing care assigned to Dr. Lorel Monaco, follow-up on urinalysis  and thereafter anticipate discharge. ____________________________________________   FINAL CLINICAL IMPRESSION(S) / ED DIAGNOSES  Final diagnoses:  Episode of confusion  Anxiety        Note:  This document was prepared using Dragon voice recognition software and may include unintentional dictation errors       Delman Kitten, MD 08/12/18 2109    Delman Kitten, MD 08/15/18 [phone]

## 2018-08-12 NOTE — Progress Notes (Signed)
   08/12/18 1634  Clinical Encounter Type  Visited With Patient  Visit Type Initial;Spiritual support  Recommendations Follow-up as needed.   Chaplain responded to Code Stroke and assisted patient and family as needed.

## 2018-08-12 NOTE — ED Notes (Signed)
Pt provided water w/ permission of dr. Jacqualine Code

## 2018-08-12 NOTE — ED Notes (Signed)
Pt refusing CT at this time

## 2018-08-12 NOTE — ED Notes (Signed)
Pt ambulatory to restroom

## 2018-08-14 LAB — URINE CULTURE: Culture: <10000 — AB

## 2018-08-17 ENCOUNTER — Other Ambulatory Visit: Payer: Self-pay

## 2018-08-17 ENCOUNTER — Emergency Department
Admission: EM | Admit: 2018-08-17 | Discharge: 2018-08-17 | Disposition: A | Discharge status: Home / Self Care | Payer: Medicare Other | Attending: Emergency Medicine | Admitting: Emergency Medicine

## 2018-08-17 DIAGNOSIS — Z87891 Personal history of nicotine dependence: Secondary | ICD-10-CM | POA: Insufficient documentation

## 2018-08-17 DIAGNOSIS — J45909 Unspecified asthma, uncomplicated: Secondary | ICD-10-CM | POA: Insufficient documentation

## 2018-08-17 DIAGNOSIS — F419 Anxiety disorder, unspecified: Secondary | ICD-10-CM

## 2018-08-17 DIAGNOSIS — J449 Chronic obstructive pulmonary disease, unspecified: Secondary | ICD-10-CM | POA: Diagnosis not present

## 2018-08-17 DIAGNOSIS — Z79899 Other long term (current) drug therapy: Secondary | ICD-10-CM | POA: Diagnosis not present

## 2018-08-17 DIAGNOSIS — Z9049 Acquired absence of other specified parts of digestive tract: Secondary | ICD-10-CM | POA: Insufficient documentation

## 2018-08-17 DIAGNOSIS — I10 Essential (primary) hypertension: Secondary | ICD-10-CM | POA: Insufficient documentation

## 2018-08-17 LAB — BASIC METABOLIC PANEL
ANION GAP: 9 (ref 5–15)
BUN: 16 mg/dL (ref 8–23)
CO2: 25 mmol/L (ref 22–32)
Calcium: 9.3 mg/dL (ref 8.9–10.3)
Chloride: 107 mmol/L (ref 98–111)
Creatinine, Ser: 0.65 mg/dL (ref 0.44–1.00)
GFR calc Af Amer: >60 mL/min (ref >60)
GFR calc non Af Amer: >60 mL/min (ref >60)
Glucose, Bld: 101 mg/dL — ABNORMAL HIGH (ref 70–99)
Potassium: 3.6 mmol/L (ref 3.5–5.1)
SODIUM: 141 mmol/L (ref 135–145)

## 2018-08-17 LAB — CBC
HCT: 44 % (ref 36.0–46.0)
Hemoglobin: 14.5 g/dL (ref 12.0–15.0)
MCH: 29.6 pg (ref 26.0–34.0)
MCHC: 33 g/dL (ref 30.0–36.0)
MCV: 89.8 fL (ref 80.0–100.0)
Platelets: 259 10*3/uL (ref 150–400)
RBC: 4.9 MIL/uL (ref 3.87–5.11)
RDW: 11.8 % (ref 11.5–15.5)
WBC: 4.7 10*3/uL (ref 4.0–10.5)
nRBC: 0 % (ref 0.0–0.2)

## 2018-08-17 LAB — TROPONIN I: Troponin I: <0.03 ng/mL (ref <0.03)

## 2018-08-17 MED ORDER — LOSARTAN POTASSIUM 50 MG PO TABS
100.0000 mg | ORAL_TABLET | Freq: Once | ORAL | Status: AC
  Administered 2018-08-17: 100 mg via ORAL
  Filled 2018-08-17: qty 2

## 2018-08-17 MED ORDER — CARVEDILOL 6.25 MG PO TABS
6.2500 mg | ORAL_TABLET | Freq: Two times a day (BID) | ORAL | Status: DC
  Administered 2018-08-17: 6.25 mg via ORAL
  Filled 2018-08-17: qty 1

## 2018-08-17 MED ORDER — LORAZEPAM 2 MG/ML IJ SOLN: 1.0000 mg | Freq: Once | INTRAMUSCULAR | Status: DC

## 2018-08-17 NOTE — ED Notes (Signed)
Called pharmacy to check on ativan - they will send

## 2018-08-17 NOTE — ED Triage Notes (Signed)
Patient to ED for increased blood pressure. States she woke up "out of a dead sleep" and her heart was pounding and she had a feeling she needed to check her blood pressure. Has a blood pressure cuff in the car and she checked it on that one; states it was high. Patient is speaking rapidly and when asked if she was an anxious person states Yes, Yes I am. Patient states she was here over the weekend for similar symptoms.

## 2018-08-17 NOTE — ED Notes (Signed)
Pt reports hx of anxiety and HTN She appears to be anxious at this time She reports that she was here [place]  She reports that she has headache and neck pain x several days - She reports that she woke from sleep with HTN (181/105) and "heart racing" (rate 107) c/o left sided chest pain - reports lightheadedness - denies shortness of breath (states short of breath when walks up stairs but has hx of COPD

## 2018-08-17 NOTE — ED Notes (Signed)
Dr Kerman Passey stated not to give Ativan

## 2018-08-17 NOTE — ED Notes (Signed)
EKG for now, no blood work at this time per Dr. Beather Arbour.

## 2018-08-17 NOTE — ED Notes (Signed)
Awaiting ativan from pharmacy

## 2018-08-17 NOTE — ED Provider Notes (Signed)
[facility] Emergency Department Provider Note  Time seen: 7:32 AM  I have reviewed the triage vital signs and the nursing notes.   HISTORY  Chief Complaint Hypertension    HPI Andrea Ford is a 68 y.o. female with a past medical history of asthma, COPD, hypertension, anxiety, presents to the emergency department for heart pounding and high blood pressure.  According to the patient she is awoken from her sleep feeling like her heart was pounding, had a headache and felt like her blood pressure was very high.  States she took her blood pressure and it was elevated, which concerned the patient so she came to the emergency department for evaluation.  Patient admits that she does have severe anxiety but states she did not feel anxious, did not take her anxiety medication or blood pressure medication and came directly to the ER for evaluation.  Denies any chest pain but did feel like her heart was pounding earlier.  Patient is extremely anxious appearing in the emergency department very rapid speech.   Past Medical History:  Diagnosis Date  . Asthma   . COPD (chronic obstructive pulmonary disease) ([place])   . Hypertension   . Migraine     Patient Active Problem List   Diagnosis Date Noted  . Accelerated hypertension 07/13/2016  . Chest pain 07/13/2016  . Abnormal EKG 07/13/2016  . GAD (generalized anxiety disorder) 07/13/2016    Past Surgical History:  Procedure Laterality Date  . ANKLE SURGERY Right   . CARDIAC CATHETERIZATION Right 07/15/2016   Procedure: Left Heart Cath and Coronary Angiography;  Surgeon: Dionisio David, MD;  Location: [facility];  Service: Cardiovascular;  Laterality: Right;  . CHOLECYSTECTOMY    . TUBAL LIGATION      Prior to Admission medications   Medication Sig Start Date End Date Taking? Authorizing Provider  albuterol (PROVENTIL HFA;VENTOLIN HFA) 108 (90 BASE) MCG/ACT inhaler Inhale into the lungs every 6 (six) hours  as needed for wheezing or shortness of breath.    [provider]  albuterol (PROVENTIL) (2.5 MG/3ML) 0.083% nebulizer solution Take 3 mLs (2.5 mg total) by nebulization every 6 (six) hours as needed for wheezing or shortness of breath. 06/25/18   Carrie Mew, MD  atorvastatin (LIPITOR) 40 MG tablet Take 1 tablet (40 mg total) by mouth daily at 6 PM. Patient taking differently: Take 80 mg by mouth daily at 6 PM.  07/15/16   Demetrios Loll, MD  carvedilol (COREG) 6.25 MG tablet Take 6.25 mg by mouth at bedtime.    [provider]  Cholecalciferol (VITAMIN D-3 PO) Take 1 tablet by mouth daily.    [provider]  citalopram (CELEXA) 20 MG tablet Take 20 mg by mouth daily.  05/02/16   [provider]  clonazePAM (KLONOPIN) 0.5 MG tablet Take 0.25 mg by mouth 2 (two) times daily as needed for anxiety.    [provider]  diclofenac sodium (VOLTAREN) 1 % GEL Apply 2 g topically 4 (four) times daily. 02/25/12 03/03/19  [provider]  dicyclomine (BENTYL) 20 MG tablet Take 20 mg by mouth daily as needed for spasms.    [provider]  famotidine (PEPCID) 40 MG tablet Take 1 tablet (40 mg total) by mouth every evening. 11/24/16 08/12/18  Nance Pear, MD  hydrOXYzine (ATARAX/VISTARIL) 25 MG tablet Take 25 mg by mouth daily as needed. 05/02/16   [provider]  losartan (COZAAR) 100 MG tablet Take 100 mg by  mouth daily. 02/01/16 08/12/18  [provider]  rizatriptan (MAXALT-MLT) 10 MG disintegrating tablet Take 10 mg by mouth daily as needed. 12/04/17   [provider]  traZODone (DESYREL) 50 MG tablet Take 50 mg by mouth at bedtime as needed for sleep.    [provider]    Allergies  Allergen Reactions  . Prednisone Rash and Other (See Comments)    hyper   . Diphenhydramine Hcl Other (See Comments)  . Other Other (See Comments)    Pet dander  . Oxycodone-Acetaminophen   . Codeine Itching and Other (See  Comments)  . Diphenhydramine Anxiety  . Etodolac Rash, Nausea And Vomiting and Other (See Comments)  . Hydrocodone Itching  . Meperidine Rash and Other (See Comments)    Pt. Not aware  Of ever having any demerol   . Venlafaxine Rash and Other (See Comments)    No family history on file.  Social History Social History   Tobacco Use  . Smoking status: Former Research scientist (life sciences)  . Smokeless tobacco: Never Used  Substance Use Topics  . Alcohol use: No  . Drug use: No    Review of Systems Constitutional: Negative for fever. Cardiovascular: Negative for chest pain.  Feeling like her heart was beating hard in her chest. Respiratory: Negative for shortness of breath. Gastrointestinal: Negative for abdominal pain, vomiting  Musculoskeletal: Negative for musculoskeletal complaints Skin: Negative for skin complaints  Neurological: Mild headache All other ROS negative  ____________________________________________   PHYSICAL EXAM:  VITAL SIGNS: ED Triage Vitals  Enc Vitals Group     BP 08/17/18 0643 (!) 170/102     Pulse Rate 08/17/18 0643 (!) 105     Resp 08/17/18 0643 20     Temp 08/17/18 0643 98 F (36.7 C)     Temp Source 08/17/18 0643 Oral     SpO2 08/17/18 0643 99 %     Weight 08/17/18 0644 155 lb (70.3 kg)     Height 08/17/18 0644 5\' 1"  (1.549 m)     Head Circumference --      Peak Flow --      Pain Score 08/17/18 0643 0     Pain Loc --      Pain Edu? --      Excl. in [place]? --    Constitutional: Alert and oriented.  Anxious appearing, rapid speech. Eyes: Normal exam ENT   Head: Normocephalic and atraumatic.   Mouth/Throat: Mucous membranes are moist. Cardiovascular: Normal rate, regular rhythm around 100 bpm.  No obvious murmur. Respiratory: Normal respiratory effort without tachypnea nor retractions. Breath sounds are clear  Gastrointestinal: Soft and nontender. No distention.   Musculoskeletal: Nontender with normal range of motion in all extremities.   Neurologic:  Normal speech and language. No gross focal neurologic deficits Skin:  Skin is warm, dry and intact.  Psychiatric: Mood and affect are normal.  ____________________________________________    EKG  EKG viewed and interpreted by myself shows a sinus rhythm 86 bpm with a narrow QRS, normal axis, normal intervals, no concerning ST changes.  ____________________________________________   INITIAL IMPRESSION / ASSESSMENT AND PLAN / ED COURSE  Pertinent labs & imaging results that were available during my care of the patient were reviewed by me and considered in my medical decision making (see chart for details).  Patient presents emergency department for rapid heart rate feeling like her heart was pounding in her chest and checked her blood pressure which was elevated.  No chest pain.  However  given the patient's symptoms we will check labs including CBC, BMP and troponin.  Will obtain an EKG and continue to closely monitor.  Patient symptoms are possibly suggestive of anxiety patient has a longstanding history of anxiety and appears significantly anxious currently.  We will treat the patient's anxiety with Ativan, we will dose her morning blood pressure medications, while awaiting lab results.  Patient's labs have resulted largely within normal limits.  Reassuringly normal chemistry negative troponin.  Reassuring EKG.  Patient is feeling better after medication.  Blood pressure currently 154/94.  ____________________________________________   FINAL CLINICAL IMPRESSION(S) / ED DIAGNOSES  Hypertension Palpitations Anxiety   Harvest Dark, MD 08/17/18 [phone]

## 2018-09-04 DIAGNOSIS — H2511 Age-related nuclear cataract, right eye: Secondary | ICD-10-CM | POA: Insufficient documentation

## 2018-09-04 DIAGNOSIS — Q112 Microphthalmos: Secondary | ICD-10-CM | POA: Insufficient documentation

## 2018-09-14 ENCOUNTER — Emergency Department: Payer: Medicare Other

## 2018-09-14 ENCOUNTER — Encounter: Payer: Self-pay | Admitting: Emergency Medicine

## 2018-09-14 ENCOUNTER — Emergency Department
Admission: EM | Admit: 2018-09-14 | Discharge: 2018-09-14 | Disposition: A | Discharge status: Home / Self Care | Payer: Medicare Other | Attending: Student in an Organized Health Care Education/Training Program | Admitting: Student in an Organized Health Care Education/Training Program

## 2018-09-14 ENCOUNTER — Other Ambulatory Visit: Payer: Self-pay

## 2018-09-14 DIAGNOSIS — R002 Palpitations: Secondary | ICD-10-CM | POA: Diagnosis present

## 2018-09-14 DIAGNOSIS — J45909 Unspecified asthma, uncomplicated: Secondary | ICD-10-CM | POA: Insufficient documentation

## 2018-09-14 DIAGNOSIS — I10 Essential (primary) hypertension: Secondary | ICD-10-CM

## 2018-09-14 DIAGNOSIS — Z87891 Personal history of nicotine dependence: Secondary | ICD-10-CM | POA: Diagnosis not present

## 2018-09-14 DIAGNOSIS — Z79899 Other long term (current) drug therapy: Secondary | ICD-10-CM | POA: Diagnosis not present

## 2018-09-14 LAB — BASIC METABOLIC PANEL
Anion gap: 6 (ref 5–15)
BUN: 15 mg/dL (ref 8–23)
CALCIUM: 9 mg/dL (ref 8.9–10.3)
CO2: 29 mmol/L (ref 22–32)
CREATININE: 0.71 mg/dL (ref 0.44–1.00)
Chloride: 103 mmol/L (ref 98–111)
GFR calc Af Amer: >60 mL/min (ref >60)
GFR calc non Af Amer: >60 mL/min (ref >60)
Glucose, Bld: 106 mg/dL — ABNORMAL HIGH (ref 70–99)
Potassium: 4.4 mmol/L (ref 3.5–5.1)
Sodium: 138 mmol/L (ref 135–145)

## 2018-09-14 LAB — CBC
HCT: 43.2 % (ref 36.0–46.0)
Hemoglobin: 14.3 g/dL (ref 12.0–15.0)
MCH: 29.4 pg (ref 26.0–34.0)
MCHC: 33.1 g/dL (ref 30.0–36.0)
MCV: 88.7 fL (ref 80.0–100.0)
NRBC: 0 % (ref 0.0–0.2)
PLATELETS: 270 10*3/uL (ref 150–400)
RBC: 4.87 MIL/uL (ref 3.87–5.11)
RDW: 11.5 % (ref 11.5–15.5)
WBC: 4.7 10*3/uL (ref 4.0–10.5)

## 2018-09-14 LAB — TROPONIN I
Troponin I: <0.03 ng/mL (ref <0.03)
Troponin I: <0.03 ng/mL (ref <0.03)

## 2018-09-14 MED ORDER — LORAZEPAM 1 MG PO TABS: 1.0000 mg | ORAL_TABLET | Freq: Once | ORAL | Status: DC

## 2018-09-14 MED ORDER — LORAZEPAM 1 MG PO TABS
1.0000 mg | ORAL_TABLET | Freq: Once | ORAL | Status: AC
  Administered 2018-09-14: 1 mg via ORAL
  Filled 2018-09-14: qty 1

## 2018-09-14 MED ORDER — LORAZEPAM 0.5 MG PO TABS
0.5000 mg | ORAL_TABLET | Freq: Once | ORAL | Status: AC
  Administered 2018-09-14: 0.5 mg via ORAL
  Filled 2018-09-14: qty 1

## 2018-09-14 NOTE — ED Triage Notes (Addendum)
Patient states that she woke up at 04:00 this morning and felt like her heart was racing. Patient states that she checked her heart rate and it was 90 and that her blood pressure was 189/110. Patient states that she also has central chest pain.

## 2018-09-14 NOTE — ED Provider Notes (Signed)
[facility] Emergency Department Provider Note    First MD Initiated Contact with Patient 09/14/18 0730     (approximate)  I have reviewed the triage vital signs and the nursing notes.   HISTORY  Chief Complaint Hypertension    HPI Andrea Ford is a 68 y.o. female the below listed past medical history as well as a history of anxiety and depression presents for palpitations that woke her up around 4 AM and noted a high blood pressure.  States that she has been having trouble controlling regulating her blood pressure over the past several months.  Has been worked up extensively in [place].  Recently had a normal stress Lexiscan.  Has had Holter monitor performed without any evidence of dysrhythmia.  She denies any pain or discomfort right now at rest but does have pain with palpation of the anterior chest wall.  Denies any trauma.  No shortness of breath.  No pleuritic chest pain.  No lower extremity swelling.  States that she feels anxious at this time.    Past Medical History:  Diagnosis Date  . Asthma   . COPD (chronic obstructive pulmonary disease) ([place])   . Hypertension   . Migraine    No family history on file. Past Surgical History:  Procedure Laterality Date  . ANKLE SURGERY Right   . CARDIAC CATHETERIZATION Right 07/15/2016   Procedure: Left Heart Cath and Coronary Angiography;  Surgeon: Dionisio David, MD;  Location: [facility];  Service: Cardiovascular;  Laterality: Right;  . CHOLECYSTECTOMY    . TUBAL LIGATION     Patient Active Problem List   Diagnosis Date Noted  . Accelerated hypertension 07/13/2016  . Chest pain 07/13/2016  . Abnormal EKG 07/13/2016  . GAD (generalized anxiety disorder) 07/13/2016      Prior to Admission medications   Medication Sig Start Date End Date Taking? Authorizing Provider  albuterol (PROVENTIL HFA;VENTOLIN HFA) 108 (90 BASE) MCG/ACT inhaler Inhale into the lungs every 6 (six) hours as  needed for wheezing or shortness of breath.    [provider]  albuterol (PROVENTIL) (2.5 MG/3ML) 0.083% nebulizer solution Take 3 mLs (2.5 mg total) by nebulization every 6 (six) hours as needed for wheezing or shortness of breath. 06/25/18   Carrie Mew, MD  atorvastatin (LIPITOR) 40 MG tablet Take 1 tablet (40 mg total) by mouth daily at 6 PM. Patient taking differently: Take 80 mg by mouth daily at 6 PM.  07/15/16   Demetrios Loll, MD  carvedilol (COREG) 6.25 MG tablet Take 6.25 mg by mouth at bedtime.    [provider]  Cholecalciferol (VITAMIN D-3 PO) Take 1 tablet by mouth daily.    [provider]  citalopram (CELEXA) 20 MG tablet Take 20 mg by mouth daily.  05/02/16   [provider]  clonazePAM (KLONOPIN) 0.5 MG tablet Take 0.25 mg by mouth 2 (two) times daily as needed for anxiety.    [provider]  diclofenac sodium (VOLTAREN) 1 % GEL Apply 2 g topically 4 (four) times daily. 02/25/12 03/03/19  [provider]  dicyclomine (BENTYL) 20 MG tablet Take 20 mg by mouth daily as needed for spasms.    [provider]  famotidine (PEPCID) 40 MG tablet Take 1 tablet (40 mg total) by mouth every evening. 11/24/16 08/12/18  Nance Pear, MD  hydrOXYzine (ATARAX/VISTARIL) 25 MG tablet Take 25 mg by mouth daily as needed. 05/02/16   [provider]  losartan (  COZAAR) 100 MG tablet Take 100 mg by mouth daily. 02/01/16 08/12/18  [provider]  rizatriptan (MAXALT-MLT) 10 MG disintegrating tablet Take 10 mg by mouth daily as needed. 12/04/17   [provider]  traZODone (DESYREL) 50 MG tablet Take 50 mg by mouth at bedtime as needed for sleep.    [provider]    Allergies Prednisone; Diphenhydramine hcl; Other; Oxycodone-acetaminophen; Codeine; Diphenhydramine; Etodolac; Hydrocodone; Meperidine; and Venlafaxine    Social History Social History   Tobacco Use  . Smoking status: Former Research scientist (life sciences)  .  Smokeless tobacco: Never Used  Substance Use Topics  . Alcohol use: No  . Drug use: No    Review of Systems Patient denies headaches, rhinorrhea, blurry vision, numbness, shortness of breath, chest pain, edema, cough, abdominal pain, nausea, vomiting, diarrhea, dysuria, fevers, rashes or hallucinations unless otherwise stated above in HPI. ____________________________________________   PHYSICAL EXAM:  VITAL SIGNS: Vitals:   09/14/18 0800 09/14/18 0930  BP: (!) 163/103 (!) 160/98  Pulse: 73 71  Resp: (!) 23 16  Temp:    SpO2: 99% 97%    Constitutional: Alert and oriented.  Eyes: Conjunctivae are normal.  Head: Atraumatic. Nose: No congestion/rhinnorhea. Mouth/Throat: Mucous membranes are moist.   Neck: No stridor. Painless ROM.  Cardiovascular: Normal rate, regular rhythm. Grossly normal heart sounds.  Good peripheral circulation. Respiratory: Normal respiratory effort.  No retractions. Lungs CTAB. Gastrointestinal: Soft and nontender. No distention. No abdominal bruits. No CVA tenderness. Genitourinary:  Musculoskeletal: No lower extremity tenderness nor edema.  No joint effusions. Neurologic:  Normal speech and language. No gross focal neurologic deficits are appreciated. No facial droop Skin:  Skin is warm, dry and intact. No rash noted. Psychiatric: Mood and affect are anxious appearing. Speech and behavior are normal.  ____________________________________________   LABS (all labs ordered are listed, but only abnormal results are displayed)  Results for orders placed or performed during the hospital encounter of 09/14/18 (from the past 24 hour(s))  Basic metabolic panel     Status: Abnormal   Collection Time: 09/14/18  6:29 AM  Result Value Ref Range   Sodium 138 135 - 145 mmol/L   Potassium 4.4 3.5 - 5.1 mmol/L   Chloride 103 98 - 111 mmol/L   CO2 29 22 - 32 mmol/L   Glucose, Bld 106 (H) 70 - 99 mg/dL   BUN 15 8 - 23 mg/dL   Creatinine, Ser 0.71 0.44 - 1.00  mg/dL   Calcium 9.0 8.9 - 10.3 mg/dL   GFR calc non Af Amer >60 >60 mL/min   GFR calc Af Amer >60 >60 mL/min   Anion gap 6 5 - 15  CBC     Status: None   Collection Time: 09/14/18  6:29 AM  Result Value Ref Range   WBC 4.7 4.0 - 10.5 K/uL   RBC 4.87 3.87 - 5.11 MIL/uL   Hemoglobin 14.3 12.0 - 15.0 g/dL   HCT 43.2 36.0 - 46.0 %   MCV 88.7 80.0 - 100.0 fL   MCH 29.4 26.0 - 34.0 pg   MCHC 33.1 30.0 - 36.0 g/dL   RDW 11.5 11.5 - 15.5 %   Platelets 270 150 - 400 K/uL   nRBC 0.0 0.0 - 0.2 %  Troponin I - ONCE - STAT     Status: None   Collection Time: 09/14/18  6:29 AM  Result Value Ref Range   Troponin I <0.03 <0.03 ng/mL  Troponin I - Once-Timed  Status: None   Collection Time: 09/14/18  9:30 AM  Result Value Ref Range   Troponin I <0.03 <0.03 ng/mL   ____________________________________________  EKG My review and personal interpretation at Time: 6:19   Indication: htn  Rate: 85  Rhythm: sinus Axis: normal Other: normal intervals, non specific st abn inerolateral leads that is unchanged from previous tracing 08/17/18 ____________________________________________  RADIOLOGY  I personally reviewed all radiographic images ordered to evaluate for the above acute complaints and reviewed radiology reports and findings.  These findings were personally discussed with the patient.  Please see medical record for radiology report.  ____________________________________________   PROCEDURES  Procedure(s) performed:  Procedures    Critical Care performed: no ____________________________________________   INITIAL IMPRESSION / ASSESSMENT AND PLAN / ED COURSE  Pertinent labs & imaging results that were available during my care of the patient were reviewed by me and considered in my medical decision making (see chart for details).   DDX: dysrhythmia, chf, pna, anxietym htn, doubt acs/pe/dissection  VELMA AGNES is a 68 y.o. who presents to the ED with symptoms as described  above.  Patient well-appearing nontoxic.  She is anxious.  Afebrile mildly hypertensive.  EKG is unchanged from previous.  Her blood work is reassuring.  We will plan on repeat troponin with telemetry monitoring.  This not consistent with PE or dissection.  I have low suspicion for ACS.  Will give anxiolytic and reassess.  Clinical Course as of Sep 14 1021  Mon Sep 14, 2018  1022 Repeat troponin is negative.  This point believe she stable and appropriate for outpatient follow-up.   [PR]    Clinical Course User Index [PR] Merlyn Lot, MD     As part of my medical decision making, I reviewed the following data within the [facility] notes reviewed and incorporated, Labs reviewed, notes from prior ED visits and [place] Controlled Substance Database   ____________________________________________   FINAL CLINICAL IMPRESSION(S) / ED DIAGNOSES  Final diagnoses:  Palpitations  Hypertension, unspecified type      NEW MEDICATIONS STARTED DURING THIS VISIT:  New Prescriptions   No medications on file     Note:  This document was prepared using Dragon voice recognition software and may include unintentional dictation errors.    Merlyn Lot, MD 09/14/18 1023

## 2018-09-21 ENCOUNTER — Other Ambulatory Visit: Payer: Self-pay

## 2018-09-21 ENCOUNTER — Encounter: Payer: Self-pay | Admitting: Emergency Medicine

## 2018-09-21 ENCOUNTER — Emergency Department
Admission: EM | Admit: 2018-09-21 | Discharge: 2018-09-21 | Disposition: A | Discharge status: Home / Self Care | Payer: Medicare Other | Attending: Emergency Medicine | Admitting: Emergency Medicine

## 2018-09-21 ENCOUNTER — Emergency Department: Payer: Medicare Other

## 2018-09-21 DIAGNOSIS — R0602 Shortness of breath: Secondary | ICD-10-CM | POA: Diagnosis not present

## 2018-09-21 DIAGNOSIS — R079 Chest pain, unspecified: Secondary | ICD-10-CM

## 2018-09-21 DIAGNOSIS — Z87891 Personal history of nicotine dependence: Secondary | ICD-10-CM | POA: Insufficient documentation

## 2018-09-21 DIAGNOSIS — I10 Essential (primary) hypertension: Secondary | ICD-10-CM | POA: Diagnosis not present

## 2018-09-21 DIAGNOSIS — J449 Chronic obstructive pulmonary disease, unspecified: Secondary | ICD-10-CM | POA: Diagnosis not present

## 2018-09-21 LAB — BASIC METABOLIC PANEL
Anion gap: 8 (ref 5–15)
BUN: 14 mg/dL (ref 8–23)
CO2: 27 mmol/L (ref 22–32)
Calcium: 9 mg/dL (ref 8.9–10.3)
Chloride: 103 mmol/L (ref 98–111)
Creatinine, Ser: 0.67 mg/dL (ref 0.44–1.00)
GFR calc Af Amer: >60 mL/min (ref >60)
Glucose, Bld: 119 mg/dL — ABNORMAL HIGH (ref 70–99)
Potassium: 3.3 mmol/L — ABNORMAL LOW (ref 3.5–5.1)
Sodium: 138 mmol/L (ref 135–145)

## 2018-09-21 LAB — CBC
HCT: 43.4 % (ref 36.0–46.0)
Hemoglobin: 14.6 g/dL (ref 12.0–15.0)
MCH: 29.6 pg (ref 26.0–34.0)
MCHC: 33.6 g/dL (ref 30.0–36.0)
MCV: 88 fL (ref 80.0–100.0)
Platelets: 261 10*3/uL (ref 150–400)
RBC: 4.93 MIL/uL (ref 3.87–5.11)
RDW: 11.5 % (ref 11.5–15.5)
WBC: 6.7 10*3/uL (ref 4.0–10.5)
nRBC: 0 % (ref 0.0–0.2)

## 2018-09-21 LAB — TROPONIN I

## 2018-09-21 NOTE — ED Triage Notes (Signed)
Here for mid chest pain. Reports has been seen multiple times for same. Saw dr Clayborn Bigness last Friday.  meds were switched and started with chest pain over weekend.  Some SHOB. No fever or cough. Unlabored. Color WNL.

## 2018-09-21 NOTE — ED Provider Notes (Addendum)
[facility] Emergency Department Provider Note       Time seen: ----------------------------------------- 3:22 PM on 09/21/2018 -----------------------------------------   I have reviewed the triage vital signs and the nursing notes.  HISTORY   Chief Complaint Chest Pain    HPI Andrea Ford is a 68 y.o. female with a history of asthma, COPD, hypertension, migraine who presents to the ED for midsternal chest pain.  Patient reports she has been seen numerous times for same, received recently seen by cardiologist for this.  Her medications were changed from propranolol to metoprolol.  She started having chest pain over the weekend again.  She has had some shortness of breath, denies any recent illness.  Past Medical History:  Diagnosis Date  . Asthma   . COPD (chronic obstructive pulmonary disease) ([place])   . Hypertension   . Migraine     Patient Active Problem List   Diagnosis Date Noted  . Accelerated hypertension 07/13/2016  . Chest pain 07/13/2016  . Abnormal EKG 07/13/2016  . GAD (generalized anxiety disorder) 07/13/2016    Past Surgical History:  Procedure Laterality Date  . ANKLE SURGERY Right   . CARDIAC CATHETERIZATION Right 07/15/2016   Procedure: Left Heart Cath and Coronary Angiography;  Surgeon: Dionisio David, MD;  Location: [facility];  Service: Cardiovascular;  Laterality: Right;  . CHOLECYSTECTOMY    . TUBAL LIGATION      Allergies Prednisone; Diphenhydramine hcl; Other; Oxycodone-acetaminophen; Codeine; Diphenhydramine; Etodolac; Hydrocodone; Meperidine; and Venlafaxine  Social History Social History   Tobacco Use  . Smoking status: Former Research scientist (life sciences)  . Smokeless tobacco: Never Used  Substance Use Topics  . Alcohol use: No  . Drug use: No   Review of Systems Constitutional: Negative for fever. Cardiovascular: Positive for chest pain Respiratory: Negative for shortness of breath. Gastrointestinal: Negative for  abdominal pain, vomiting and diarrhea. Musculoskeletal: Negative for back pain. Skin: Negative for rash. Neurological: Negative for headaches, focal weakness or numbness.  All systems negative/normal/unremarkable except as stated in the HPI  ____________________________________________   PHYSICAL EXAM:  VITAL SIGNS: ED Triage Vitals  Enc Vitals Group     BP 09/21/18 1456 (!) 168/92     Pulse Rate 09/21/18 1456 (!) 107     Resp 09/21/18 1456 18     Temp 09/21/18 1456 97.6 F (36.4 C)     Temp Source 09/21/18 1456 Oral     SpO2 09/21/18 1456 98 %     Weight 09/21/18 1454 155 lb (70.3 kg)     Height 09/21/18 1454 5\' 1"  (1.549 m)     Head Circumference --      Peak Flow --      Pain Score 09/21/18 1453 7     Pain Loc --      Pain Edu? --      Excl. in [place]? --    Constitutional: Alert and oriented.  Anxious, no distress Eyes: Conjunctivae are normal. Normal extraocular movements. ENT      Head: Normocephalic and atraumatic.      Nose: No congestion/rhinnorhea.      Mouth/Throat: Mucous membranes are moist.      Neck: No stridor. Cardiovascular: Normal rate, regular rhythm. No murmurs, rubs, or gallops. Respiratory: Normal respiratory effort without tachypnea nor retractions. Breath sounds are clear and equal bilaterally. No wheezes/rales/rhonchi. Gastrointestinal: Soft and nontender. Normal bowel sounds Musculoskeletal: Nontender with normal range of motion in extremities. No lower extremity tenderness nor edema. Neurologic:  Normal speech  and language. No gross focal neurologic deficits are appreciated.  Skin:  Skin is warm, dry and intact. No rash noted. Psychiatric: Mood and affect are normal. Speech and behavior are normal.  ____________________________________________  EKG: Interpreted by me.  Sinus rhythm with a rate of 99 bpm, normal PR interval, normal QRS, normal QT  ____________________________________________  ED COURSE:  As part of my medical decision  making, I reviewed the following data within the [facility] History obtained from family if available, nursing notes, old chart and ekg, as well as notes from prior ED visits. Patient presented for nonspecific chest pain, we will assess with labs and imaging as indicated at this time.   Procedures ____________________________________________   LABS (pertinent positives/negatives)  Labs Reviewed  BASIC METABOLIC PANEL - Abnormal; Notable for the following components:      Result Value   Potassium 3.3 (*)    Glucose, Bld 119 (*)    All other components within normal limits  CBC  TROPONIN I    RADIOLOGY Chest x-ray Unremarkable  ____________________________________________   DIFFERENTIAL DIAGNOSIS   Anxiety, palpitations, nonspecific chest pain, unstable angina, musculoskeletal pain, GERD  FINAL ASSESSMENT AND PLAN  Chest pain   Plan: The patient had presented for specific chest pain. Patient's labs are normal. Patient's imaging is also normal.  No clear etiology although I suspect anxiety may play a part of this.  She is cleared for outpatient follow-up.   Laurence Aly, MD    Note: This note was generated in part or whole with voice recognition software. Voice recognition is usually quite accurate but there are transcription errors that can and very often do occur. I apologize for any typographical errors that were not detected and corrected.     Earleen Newport, MD 09/21/18 1536    Earleen Newport, MD 09/21/18 [phone]

## 2018-09-24 ENCOUNTER — Other Ambulatory Visit: Payer: Self-pay | Admitting: Internal Medicine

## 2018-09-24 DIAGNOSIS — I739 Peripheral vascular disease, unspecified: Secondary | ICD-10-CM

## 2018-10-01 ENCOUNTER — Other Ambulatory Visit: Payer: Self-pay | Admitting: Internal Medicine

## 2018-10-01 ENCOUNTER — Ambulatory Visit
Admission: RE | Admit: 2018-10-01 | Discharge: 2018-10-01 | Disposition: A | Discharge status: Home / Self Care | Payer: Medicare Other | Source: Ambulatory Visit | Attending: Internal Medicine | Admitting: Internal Medicine

## 2018-10-01 DIAGNOSIS — I739 Peripheral vascular disease, unspecified: Secondary | ICD-10-CM

## 2018-10-04 ENCOUNTER — Emergency Department: Payer: Medicare Other

## 2018-10-04 ENCOUNTER — Emergency Department
Admission: EM | Admit: 2018-10-04 | Discharge: 2018-10-04 | Disposition: A | Discharge status: Home / Self Care | Payer: Medicare Other | Attending: Emergency Medicine | Admitting: Emergency Medicine

## 2018-10-04 ENCOUNTER — Encounter: Payer: Self-pay | Admitting: Emergency Medicine

## 2018-10-04 ENCOUNTER — Other Ambulatory Visit: Payer: Self-pay

## 2018-10-04 DIAGNOSIS — R0789 Other chest pain: Secondary | ICD-10-CM | POA: Insufficient documentation

## 2018-10-04 DIAGNOSIS — J45909 Unspecified asthma, uncomplicated: Secondary | ICD-10-CM | POA: Diagnosis not present

## 2018-10-04 DIAGNOSIS — Z79899 Other long term (current) drug therapy: Secondary | ICD-10-CM | POA: Insufficient documentation

## 2018-10-04 DIAGNOSIS — Z87891 Personal history of nicotine dependence: Secondary | ICD-10-CM | POA: Diagnosis not present

## 2018-10-04 DIAGNOSIS — I10 Essential (primary) hypertension: Secondary | ICD-10-CM | POA: Diagnosis present

## 2018-10-04 DIAGNOSIS — J449 Chronic obstructive pulmonary disease, unspecified: Secondary | ICD-10-CM | POA: Insufficient documentation

## 2018-10-04 DIAGNOSIS — R079 Chest pain, unspecified: Secondary | ICD-10-CM

## 2018-10-04 LAB — CBC
HCT: 42.1 % (ref 36.0–46.0)
Hemoglobin: 14.2 g/dL (ref 12.0–15.0)
MCH: 29.2 pg (ref 26.0–34.0)
MCHC: 33.7 g/dL (ref 30.0–36.0)
MCV: 86.4 fL (ref 80.0–100.0)
Platelets: 270 10*3/uL (ref 150–400)
RBC: 4.87 MIL/uL (ref 3.87–5.11)
RDW: 11.6 % (ref 11.5–15.5)
WBC: 4.3 10*3/uL (ref 4.0–10.5)
nRBC: 0 % (ref 0.0–0.2)

## 2018-10-04 LAB — BASIC METABOLIC PANEL
Anion gap: 7 (ref 5–15)
BUN: 13 mg/dL (ref 8–23)
CO2: 25 mmol/L (ref 22–32)
Calcium: 8.9 mg/dL (ref 8.9–10.3)
Chloride: 107 mmol/L (ref 98–111)
Creatinine, Ser: 0.7 mg/dL (ref 0.44–1.00)
GFR calc Af Amer: >60 mL/min (ref >60)
GFR calc non Af Amer: >60 mL/min (ref >60)
Glucose, Bld: 100 mg/dL — ABNORMAL HIGH (ref 70–99)
Potassium: 4 mmol/L (ref 3.5–5.1)
Sodium: 139 mmol/L (ref 135–145)

## 2018-10-04 LAB — TROPONIN I: Troponin I: <0.03 ng/mL (ref <0.03)

## 2018-10-04 MED ORDER — SODIUM CHLORIDE 0.9% FLUSH: 3.0000 mL | Freq: Once | INTRAVENOUS | Status: DC

## 2018-10-04 NOTE — ED Provider Notes (Signed)
[facility] Emergency Department Provider Note   ____________________________________________    I have reviewed the triage vital signs and the nursing notes.   HISTORY  Chief Complaint Hypertension     HPI Andrea Ford is a 68 y.o. female who presents for elevated blood pressure.  Patient reports she woke up this morning earlier than normal and decided to check her blood pressure and found it to be elevated.  She reports she has been working with Dr. Clayborn Bigness of cardiology to better control her blood pressure, has recently had kidney and "heart ultrasounds "and has follow-up  next month.  She also reports somewhat chronic left-sided chest discomfort near her left shoulder has been seen for this multiple times in the emergency department.  This has not changed.  No fevers or chills.  No shortness of breath.  Currently feels well  Past Medical History:  Diagnosis Date  . Asthma   . COPD (chronic obstructive pulmonary disease) ()   . Hypertension   . Migraine     Patient Active Problem List   Diagnosis Date Noted  . Accelerated hypertension 07/13/2016  . Chest pain 07/13/2016  . Abnormal EKG 07/13/2016  . GAD (generalized anxiety disorder) 07/13/2016    Past Surgical History:  Procedure Laterality Date  . ANKLE SURGERY Right   . CARDIAC CATHETERIZATION Right 07/15/2016   Procedure: Left Heart Cath and Coronary Angiography;  Surgeon: Dionisio David, MD;  Location: [place] [facility];  Service: Cardiovascular;  Laterality: Right;  . CHOLECYSTECTOMY    . TUBAL LIGATION      Prior to Admission medications   Medication Sig Start Date End Date Taking? Authorizing Provider  albuterol (PROVENTIL HFA;VENTOLIN HFA) 108 (90 BASE) MCG/ACT inhaler Inhale into the lungs every 6 (six) hours as needed for wheezing or shortness of breath.    [provider]  albuterol (PROVENTIL) (2.5 MG/3ML) 0.083% nebulizer solution Take 3 mLs (2.5 mg total)  by nebulization every 6 (six) hours as needed for wheezing or shortness of breath. 06/25/18   Carrie Mew, MD  atorvastatin (LIPITOR) 40 MG tablet Take 1 tablet (40 mg total) by mouth daily at 6 PM. Patient taking differently: Take 80 mg by mouth daily at 6 PM.  07/15/16   Demetrios Loll, MD  carvedilol (COREG) 6.25 MG tablet Take 6.25 mg by mouth at bedtime.    [provider]  Cholecalciferol (VITAMIN D-3 PO) Take 1 tablet by mouth daily.    [provider]  citalopram (CELEXA) 20 MG tablet Take 20 mg by mouth daily.  05/02/16   [provider]  clonazePAM (KLONOPIN) 0.5 MG tablet Take 0.25 mg by mouth 2 (two) times daily as needed for anxiety.    [provider]  diclofenac sodium (VOLTAREN) 1 % GEL Apply 2 g topically 4 (four) times daily. 02/25/12 03/03/19  [provider]  dicyclomine (BENTYL) 20 MG tablet Take 20 mg by mouth daily as needed for spasms.    [provider]  famotidine (PEPCID) 40 MG tablet Take 1 tablet (40 mg total) by mouth every evening. 11/24/16 08/12/18  Nance Pear, MD  hydrOXYzine (ATARAX/VISTARIL) 25 MG tablet Take 25 mg by mouth daily as needed. 05/02/16   [provider]  losartan (COZAAR) 100 MG tablet Take 100 mg by mouth daily. 02/01/16 08/12/18  [provider]  rizatriptan (MAXALT-MLT) 10 MG disintegrating tablet Take 10 mg by mouth daily as needed. 12/04/17   [provider]  traZODone (DESYREL) 50 MG tablet Take 50 mg by mouth at bedtime as needed for sleep.    [provider]     Allergies Prednisone; Diphenhydramine hcl; Other; Oxycodone-acetaminophen; Codeine; Diphenhydramine; Etodolac; Hydrocodone; Meperidine; and Venlafaxine  No family history on file.  Social History Social History   Tobacco Use  . Smoking status: Former Research scientist (life sciences)  . Smokeless tobacco: Never Used  Substance Use Topics  . Alcohol use: No  . Drug use: No    Review of Systems  Constitutional:  No fever/chills Eyes: No visual changes.  ENT: No sore throat. Cardiovascular: As above Respiratory: Denies shortness of breath. Gastrointestinal: No abdominal pain.  No nausea, no vomiting.   Genitourinary: Negative for dysuria. Musculoskeletal: Negative for back pain. Skin: Negative for rash. Neurological: Negative for headaches or weakness   ____________________________________________   PHYSICAL EXAM:  VITAL SIGNS: ED Triage Vitals  Enc Vitals Group     BP 10/04/18 0753 (!) 173/101     Pulse Rate 10/04/18 0753 76     Resp 10/04/18 0753 16     Temp 10/04/18 0753 98.4 F (36.9 C)     Temp Source 10/04/18 0753 Oral     SpO2 10/04/18 0753 97 %     Weight 10/04/18 0754 70.3 kg (155 lb)     Height 10/04/18 0754 1.549 m (5\' 1" )     Head Circumference --      Peak Flow --      Pain Score 10/04/18 0805 6     Pain Loc --      Pain Edu? --      Excl. in [place]? --     Constitutional: Alert and oriented. No acute distress.  Eyes: Conjunctivae are normal.   Nose: No congestion/rhinnorhea. Mouth/Throat: Mucous membranes are moist.    Cardiovascular: Normal rate, regular rhythm. Grossly normal heart sounds.  Good peripheral circulation. Respiratory: Normal respiratory effort.  No retractions. Lungs CTAB. Gastrointestinal: Soft and nontender. No distention.    Musculoskeletal: No lower extremity tenderness nor edema.  Warm and well perfused Neurologic:  Normal speech and language. No gross focal neurologic deficits are appreciated.  Skin:  Skin is warm, dry and intact. No rash noted. Psychiatric: Mood and affect are normal. Speech and behavior are normal.  ____________________________________________   LABS (all labs ordered are listed, but only abnormal results are displayed)  Labs Reviewed  BASIC METABOLIC PANEL - Abnormal; Notable for the following components:      Result Value   Glucose, Bld 100 (*)    All other components within normal limits  CBC  TROPONIN I    ____________________________________________  EKG  ED ECG REPORT I, Lavonia Drafts, the attending physician, personally viewed and interpreted this ECG.  Date: 10/04/2018  Rhythm: normal sinus rhythm QRS Axis: normal Intervals: normal ST/T Wave abnormalities: normal Narrative Interpretation: no evidence of acute ischemia  ____________________________________________  RADIOLOGY  Chest x-ray unremarkable ____________________________________________   PROCEDURES  Procedure(s) performed: No  Procedures   Critical Care performed: No ____________________________________________   INITIAL IMPRESSION / ASSESSMENT AND PLAN / ED COURSE  Pertinent labs & imaging results that were available during my care of the patient were reviewed by me and considered in my medical decision making (see chart for details).  Patient overall well-appearing and in no acute distress, certainly seems that anxiety is playing a role with her visits to the emergency department.  EKG is reassuring, pending labs.  HPI not consistent with ACS PE myocarditis or dissection  Lab  work is unremarkable, blood pressure is improved without intervention  She has appropriate follow-up for her hypertension    ____________________________________________   FINAL CLINICAL IMPRESSION(S) / ED DIAGNOSES  Final diagnoses:  Essential hypertension  Chest pain, unspecified type        Note:  This document was prepared using Dragon voice recognition software and may include unintentional dictation errors.   Lavonia Drafts, MD 10/04/18 [id number]

## 2018-10-04 NOTE — ED Triage Notes (Signed)
Pt to ED via POV c/o high blood pressure. She states that when she woke up this morning her blood pressure was high, pt states that she rechecked it in 15 minutes intervals and it continued to run high. Pt appears to be very anxious in triage. Pt states that she is also having some discomfort in her mid chest.

## 2018-11-24 ENCOUNTER — Encounter: Payer: Self-pay | Admitting: *Deleted

## 2018-11-24 ENCOUNTER — Other Ambulatory Visit: Payer: Self-pay

## 2018-11-24 ENCOUNTER — Emergency Department
Admission: EM | Admit: 2018-11-24 | Discharge: 2018-11-24 | Disposition: A | Discharge status: Home / Self Care | Payer: Medicare Other | Attending: Emergency Medicine | Admitting: Emergency Medicine

## 2018-11-24 DIAGNOSIS — I83899 Varicose veins of unspecified lower extremities with other complications: Secondary | ICD-10-CM | POA: Insufficient documentation

## 2018-11-24 DIAGNOSIS — F419 Anxiety disorder, unspecified: Secondary | ICD-10-CM | POA: Insufficient documentation

## 2018-11-24 NOTE — ED Triage Notes (Addendum)
Pt ambulatory to triage. Pt states she took her jeans off and bleeding began from vericose vein.  No bleeding or pain at this time.  Pt also treated for RMSF and has rash on her lower back.  Pt alert.  Pt has not taken blood pressure medicine tonight.

## 2018-11-24 NOTE — ED Provider Notes (Signed)
[facility] Emergency Department Provider Note  ____________________________________________  Time seen: Approximately 10:20 PM  I have reviewed the triage vital signs and the nursing notes.   HISTORY  Chief Complaint Leg Injury and Rash    HPI Andrea Ford is a 68 y.o. female presents to the emergency department with a bleeding varicose vein of the right lower extremity that has since stopped bleeding.  Patient became concerned as she has a lot of anxiety about the pandemic.  Patient is also currently being treated for [facility] spotted fever which has also increased her anxiety.  She presents to the ED for reassurance.        Past Medical History:  Diagnosis Date  . Asthma   . COPD (chronic obstructive pulmonary disease) ([place])   . Hypertension   . Migraine     Patient Active Problem List   Diagnosis Date Noted  . Accelerated hypertension 07/13/2016  . Chest pain 07/13/2016  . Abnormal EKG 07/13/2016  . GAD (generalized anxiety disorder) 07/13/2016    Past Surgical History:  Procedure Laterality Date  . ANKLE SURGERY Right   . CARDIAC CATHETERIZATION Right 07/15/2016   Procedure: Left Heart Cath and Coronary Angiography;  Surgeon: Dionisio David, MD;  Location: [place] [facility];  Service: Cardiovascular;  Laterality: Right;  . CHOLECYSTECTOMY    . TUBAL LIGATION      Prior to Admission medications   Medication Sig Start Date End Date Taking? Authorizing Provider  albuterol (PROVENTIL HFA;VENTOLIN HFA) 108 (90 BASE) MCG/ACT inhaler Inhale into the lungs every 6 (six) hours as needed for wheezing or shortness of breath.    [provider]  albuterol (PROVENTIL) (2.5 MG/3ML) 0.083% nebulizer solution Take 3 mLs (2.5 mg total) by nebulization every 6 (six) hours as needed for wheezing or shortness of breath. 06/25/18   Carrie Mew, MD  atorvastatin (LIPITOR) 40 MG tablet Take 1 tablet (40 mg total) by mouth daily at 6  PM. Patient taking differently: Take 80 mg by mouth daily at 6 PM.  07/15/16   Demetrios Loll, MD  carvedilol (COREG) 6.25 MG tablet Take 6.25 mg by mouth at bedtime.    [provider]  Cholecalciferol (VITAMIN D-3 PO) Take 1 tablet by mouth daily.    [provider]  citalopram (CELEXA) 20 MG tablet Take 20 mg by mouth daily.  05/02/16   [provider]  clonazePAM (KLONOPIN) 0.5 MG tablet Take 0.25 mg by mouth 2 (two) times daily as needed for anxiety.    [provider]  diclofenac sodium (VOLTAREN) 1 % GEL Apply 2 g topically 4 (four) times daily. 02/25/12 03/03/19  [provider]  dicyclomine (BENTYL) 20 MG tablet Take 20 mg by mouth daily as needed for spasms.    [provider]  famotidine (PEPCID) 40 MG tablet Take 1 tablet (40 mg total) by mouth every evening. 11/24/16 08/12/18  Nance Pear, MD  hydrOXYzine (ATARAX/VISTARIL) 25 MG tablet Take 25 mg by mouth daily as needed. 05/02/16   [provider]  losartan (COZAAR) 100 MG tablet Take 100 mg by mouth daily. 02/01/16 08/12/18  [provider]  rizatriptan (MAXALT-MLT) 10 MG disintegrating tablet Take 10 mg by mouth daily as needed. 12/04/17   [provider]  traZODone (DESYREL) 50 MG tablet Take 50 mg by mouth at bedtime as needed for sleep.    [provider]    Allergies Prednisone; Diphenhydramine hcl; Other; Oxycodone-acetaminophen; Codeine; Diphenhydramine; Etodolac; Hydrocodone;  Meperidine; and Venlafaxine  No family history on file.  Social History Social History   Tobacco Use  . Smoking status: Former Research scientist (life sciences)  . Smokeless tobacco: Never Used  Substance Use Topics  . Alcohol use: No  . Drug use: No     Review of Systems  Constitutional: No fever/chills Eyes: No visual changes. No discharge ENT: No upper respiratory complaints. Cardiovascular: no chest pain. Respiratory: no cough. No SOB. Gastrointestinal: No abdominal pain.  No  nausea, no vomiting.  No diarrhea.  No constipation. Musculoskeletal: Negative for musculoskeletal pain. Skin: Patient had bleeding varicose vein.  Neurological: Negative for headaches, focal weakness or numbness.   ____________________________________________   PHYSICAL EXAM:  VITAL SIGNS: ED Triage Vitals  Enc Vitals Group     BP 11/24/18 2051 (!) 185/101     Pulse Rate 11/24/18 2051 98     Resp 11/24/18 2051 20     Temp 11/24/18 2051 98 F (36.7 C)     Temp Source 11/24/18 2051 Oral     SpO2 11/24/18 2051 98 %     Weight 11/24/18 2053 160 lb (72.6 kg)     Height 11/24/18 2053 5\' 1"  (1.549 m)     Head Circumference --      Peak Flow --      Pain Score 11/24/18 2052 7     Pain Loc --      Pain Edu? --      Excl. in [place]? --      Constitutional: Alert and oriented. Well appearing and in no acute distress. Eyes: Conjunctivae are normal. PERRL. EOMI. Head: Atraumatic. Respiratory: Normal respiratory effort without tachypnea or retractions. Lungs CTAB. Good air entry to the bases with no decreased or absent breath sounds. Musculoskeletal: Full range of motion to all extremities. No gross deformities appreciated. Neurologic:  Normal speech and language. No gross focal neurologic deficits are appreciated.  Skin: No evidence of actively bleeding varicose vein on physical exam.  Trace dried blood visualized. Psychiatric: Mood and affect are normal. Speech and behavior are normal. Patient exhibits appropriate insight and judgement.   ____________________________________________   LABS (all labs ordered are listed, but only abnormal results are displayed)  Labs Reviewed - No data to display ____________________________________________  EKG   ____________________________________________  RADIOLOGY   No results found.  ____________________________________________    PROCEDURES  Procedure(s) performed:    Procedures    Medications - No data to  display   ____________________________________________   INITIAL IMPRESSION / ASSESSMENT AND PLAN / ED COURSE  Pertinent labs & imaging results that were available during my care of the patient were reviewed by me and considered in my medical decision making (see chart for details).  Review of the [place] CSRS was performed in accordance of the [place] prior to dispensing any controlled drugs.           Assessment and plan Bleeding varicose vein 68 year old female presents to the emergency department with bleeding varicose vein of right lower extremity that resolved in emergency department.  A nonadhesive dressing with an overlying compression dressing was applied in the emergency department.  Reassurance was given.  All patient questions were answered.    ____________________________________________  FINAL CLINICAL IMPRESSION(S) / ED DIAGNOSES  Final diagnoses:  Bleeding from varicose vein      NEW MEDICATIONS STARTED DURING THIS VISIT:  ED Discharge Orders    None          This chart was dictated using voice recognition software/Dragon.  Despite best efforts to proofread, errors can occur which can change the meaning. Any change was purely unintentional.    Karren Cobble 11/24/18 2224    Carrie Mew, MD 11/24/18 2300

## 2018-11-25 ENCOUNTER — Encounter: Payer: Self-pay | Admitting: Infectious Diseases

## 2018-11-25 ENCOUNTER — Ambulatory Visit: Payer: Medicare Other | Admitting: Infectious Diseases

## 2018-11-25 VITALS — BP 172/103 | HR 96 | Temp 98.1°F | Wt 157.0 lb

## 2018-11-25 DIAGNOSIS — F430 Acute stress reaction: Secondary | ICD-10-CM | POA: Diagnosis not present

## 2018-11-25 DIAGNOSIS — L02429 Furuncle of limb, unspecified: Secondary | ICD-10-CM | POA: Diagnosis not present

## 2018-11-25 DIAGNOSIS — F411 Generalized anxiety disorder: Secondary | ICD-10-CM | POA: Diagnosis not present

## 2018-11-25 NOTE — Progress Notes (Signed)
Subjective:    Patient ID: Andrea Ford, female    DOB: 1950-11-11, 68 y.o.   MRN: [id number]  HPI The patient is a 68 year old white female with significant anxiety disorder who presents today for a positive serology for [facility] spotted fever.  The patient admits that she has had significant problems over the last 3 to 4 weeks while self-quarantining at home for COVID-19.  Approximately 7 months ago she relocated to move in with her daughter who has multiple pets including 6 dogs.  The patient has become increasingly concerned about tick exposures from her daughter's dogs over the past month.  She self-reports an episode approximately 20 years ago in which she was exposed to a tick, underwent a lumbar puncture, and was treated for an unknown illness in [place].  Records related to this incident are unavailable for my review today.  The patient admits she has not seen any spider or insect bites directly to her skin, but she is adamant that she has been bitten multiple times on her left arm neck and both legs.  She has cleaned her bed sheets multiple times and clean her room as best she can.  She was evaluated by an urgent care last week who ran multiple serologies including those for Ehrlichia, Lyme disease, and [facility] spotted fever.  Among these tests only her IgM for [facility] spotted fever was positive.  Of note, her LFTs and platelet count were both within normal limits.  Approximately 5 days ago she began doxycycline treatment and has had resolution of lesions to her left elbow and arm as well as her neck.  The remaining lesion to her left flank has also improved but remains present.  Of note none of these lesions are flat but instead are raised and erythematous without drainage.  The patient denies any extreme headache beyond her typical migraines. She also denies any photophobia or phonophobia or neck stiffness.    Review of Systems  Constitutional: Negative for chills,  fatigue and fever.  HENT: Negative for congestion, hearing loss and sinus pressure.   Eyes: Negative for photophobia, discharge, redness and visual disturbance.  Respiratory: Negative for apnea, cough, shortness of breath and wheezing.   Cardiovascular: Negative for chest pain and leg swelling.  Gastrointestinal: Positive for constipation and diarrhea. Negative for abdominal distention, abdominal pain, nausea and vomiting.  Endocrine: Negative for cold intolerance, heat intolerance, polydipsia and polyuria.  Genitourinary: Negative for dysuria, flank pain, frequency, urgency, vaginal bleeding and vaginal discharge.  Musculoskeletal: Negative for arthralgias, back pain, joint swelling and neck pain.  Skin: Negative for pallor and rash.  Allergic/Immunologic: Negative for immunocompromised state.  Neurological: Negative for dizziness, seizures, speech difficulty, weakness and headaches.  Hematological: Does not bruise/bleed easily.  Psychiatric/Behavioral: Negative for agitation, confusion, hallucinations and sleep disturbance. The patient is nervous/anxious.    extreme anxiety particularly now with COVID 19 and her recent relocation to live with her daughter.  Patient has had from [facility] for now the past 2 to 3 weeks.  She occasionally admits to palpitations and migraine headaches.  She also has irritable bowel syndrome and has waxing and waning constipation and diarrhea.  She denies any photophobia meningiomatosis or phonophobia.  Last evening she had a varicose vein on her left leg that bled for which she was seen in the ER.  Patient denies systemic fevers or chills or difficulty ambulating.  She does admit to frequent forgetfulness and repetitive speech.  Other systems reviewed are  negative except as as noted above and as per history of present illness.  Past Medical History:  Diagnosis Date  . Asthma   . COPD (chronic obstructive pulmonary disease) ([place])   . Hypertension   . Migraine    No  family history on file.   Social History   Socioeconomic History  . Marital status: Divorced    Spouse name: Not on file  . Number of children: Not on file  . Years of education: Not on file  . Highest education level: Not on file  Occupational History  . Not on file  Social Needs  . Financial resource strain: Not on file  . Food insecurity:    Worry: Not on file    Inability: Not on file  . Transportation needs:    Medical: Not on file    Non-medical: Not on file  Tobacco Use  . Smoking status: Former Research scientist (life sciences)  . Smokeless tobacco: Never Used  Substance and Sexual Activity  . Alcohol use: No  . Drug use: No  . Sexual activity: Not on file  Lifestyle  . Physical activity:    Days per week: Not on file    Minutes per session: Not on file  . Stress: Not on file  Relationships  . Social connections:    Talks on phone: Not on file    Gets together: Not on file    Attends religious service: Not on file    Active member of club or organization: Not on file    Attends meetings of clubs or organizations: Not on file    Relationship status: Not on file  . Intimate partner violence:    Fear of current or ex partner: Not on file    Emotionally abused: Not on file    Physically abused: Not on file    Forced sexual activity: Not on file  Other Topics Concern  . Not on file  Social History Narrative  . Not on file       Vitals:   11/25/18 0855  BP: (!) 172/103  Pulse: 96  Temp: 98.1 F (36.7 C)   Objective:   Physical Exam Physical Exam Gen: extremely anxious with pressured speech, NAD, A&Ox 3 Head: NCAT, no temporal wasting evident EENT: PERRL, EOMI, MMM, adequate dentition Neck: supple, no JVD CV: NRRR, no murmurs evident Pulm: CTA bilaterally, no wheeze or retractions Abd: soft, NTND, +BS Extrems: no LE edema, 2+ pulses Skin: no rashes, adequate skin turgor Neuro: CN II-XII grossly intact, no focal neurologic deficits appreciated, gait was normal, A&Ox 3      Assessment & Plan:  The patient is a 68 year old white female with significant anxiety disorder presenting for an evaluation for a positive RMSF serology  RMSF serology positivity-I am quite skeptical about the result for this patient as she has had very minimal outdoor exposure, particularly during the COVID pandemic clearly, the patient is extremely anxious regarding exposures from her daughter's dogs.  What she fails to mention though is that she is never seen any ticks on the animals nor has she seen any on her own body.  While at [facility] is endemic area for [facility] spotted fever, the patient has never had any rash which classically appears in approximately 90% of cases.  It certainly would not cause any harm to give the patient doxycycline and if doing so would continue this for 2-week duration.  Most likely, the patient's recent skin lesions are from self-inflicted wounds quite unintentional.  A great deal the visit was spent reviewing avoidance of ticks but also hand hygiene and other habits that will prevent pickers papules and unintended self-inflicted scratching they can cause local skin disease and cellulitis.

## 2018-11-25 NOTE — Patient Instructions (Signed)
Recommend obtaining white cloth gloves to wear at all times until rash resolves May apply aquaphor twice daily to wounds to ease itching Stop using triamcinolone cream to lesions Continue doxycyline to complete 10 day course in total

## 2018-11-26 ENCOUNTER — Telehealth (INDEPENDENT_AMBULATORY_CARE_PROVIDER_SITE_OTHER): Payer: Self-pay | Admitting: Nurse Practitioner

## 2018-11-26 ENCOUNTER — Other Ambulatory Visit: Payer: Self-pay

## 2018-11-26 ENCOUNTER — Emergency Department
Admission: EM | Admit: 2018-11-26 | Discharge: 2018-11-26 | Disposition: A | Discharge status: Home / Self Care | Payer: Medicare Other | Attending: Student in an Organized Health Care Education/Training Program | Admitting: Student in an Organized Health Care Education/Training Program

## 2018-11-26 ENCOUNTER — Encounter: Payer: Self-pay | Admitting: *Deleted

## 2018-11-26 DIAGNOSIS — A779 Spotted fever, unspecified: Secondary | ICD-10-CM | POA: Diagnosis not present

## 2018-11-26 DIAGNOSIS — I83892 Varicose veins of left lower extremities with other complications: Secondary | ICD-10-CM | POA: Diagnosis present

## 2018-11-26 DIAGNOSIS — J449 Chronic obstructive pulmonary disease, unspecified: Secondary | ICD-10-CM | POA: Diagnosis not present

## 2018-11-26 DIAGNOSIS — I10 Essential (primary) hypertension: Secondary | ICD-10-CM | POA: Diagnosis not present

## 2018-11-26 DIAGNOSIS — Z79899 Other long term (current) drug therapy: Secondary | ICD-10-CM | POA: Insufficient documentation

## 2018-11-26 DIAGNOSIS — Z87891 Personal history of nicotine dependence: Secondary | ICD-10-CM | POA: Insufficient documentation

## 2018-11-26 DIAGNOSIS — I83899 Varicose veins of unspecified lower extremities with other complications: Secondary | ICD-10-CM

## 2018-11-26 NOTE — ED Triage Notes (Addendum)
Pt returning to ED after being seen two days ago in ED and yesterday at infectious disease for a bleeding varicose vein. Bleeding slowed yesterday but is bleeding through the bandage again today. PT limping on leg.   Pt also currently being treated for [place] spotted fever.   Pt also presents with concerns for elevated BP. Pt took her medications 30 minutes ago. Increased stress reported with bleeding.

## 2018-11-26 NOTE — Telephone Encounter (Signed)
We can bring her in with bilateral venous reflux, she can come in sometime next week.

## 2018-11-26 NOTE — ED Provider Notes (Addendum)
[facility] Emergency Department Provider Note    First MD Initiated Contact with Patient 11/26/18 1322     (approximate)  I have reviewed the triage vital signs and the nursing notes.   HISTORY  Chief Complaint Varicose Vein bleeding    HPI Andrea Ford is a 68 y.o. female Andrea Ford past medical history presents the ER for bleeding from the posterior left leg where she has a varicose vein.  States has had intermittent bleeding for several days was seen previously.  Did not have any procedures performed.  No sutures.  Noted that it started bleeding again today.  She is currently on a doxycycline for being treated for [facility] spotted fever.  Not currently on any blood thinners.  Denies any other symptoms.  Has not noticed any spontaneous bleeding other than from the same area that she was seen 2 days ago prior.   Past Medical History:  Diagnosis Date  . Asthma   . COPD (chronic obstructive pulmonary disease) ([place])   . Hypertension   . Migraine    History reviewed. No pertinent family history. Past Surgical History:  Procedure Laterality Date  . ANKLE SURGERY Right   . CARDIAC CATHETERIZATION Right 07/15/2016   Procedure: Left Heart Cath and Coronary Angiography;  Surgeon: Dionisio David, MD;  Location: [place] [facility];  Service: Cardiovascular;  Laterality: Right;  . CHOLECYSTECTOMY    . TUBAL LIGATION     Patient Active Problem List   Diagnosis Date Noted  . Accelerated hypertension 07/13/2016  . Chest pain 07/13/2016  . Abnormal EKG 07/13/2016  . GAD (generalized anxiety disorder) 07/13/2016      Prior to Admission medications   Medication Sig Start Date End Date Taking? Authorizing Provider  albuterol (PROVENTIL HFA;VENTOLIN HFA) 108 (90 BASE) MCG/ACT inhaler Inhale into the lungs every 6 (six) hours as needed for wheezing or shortness of breath.    [provider]  albuterol (PROVENTIL) (2.5 MG/3ML) 0.083% nebulizer solution  Take 3 mLs (2.5 mg total) by nebulization every 6 (six) hours as needed for wheezing or shortness of breath. 06/25/18   Carrie Mew, MD  atorvastatin (LIPITOR) 40 MG tablet Take 1 tablet (40 mg total) by mouth daily at 6 PM. Patient taking differently: Take 80 mg by mouth daily at 6 PM.  07/15/16   Demetrios Loll, MD  carvedilol (COREG) 6.25 MG tablet Take 6.25 mg by mouth at bedtime.    [provider]  Cholecalciferol (VITAMIN D-3 PO) Take 1 tablet by mouth daily.    [provider]  citalopram (CELEXA) 20 MG tablet Take 20 mg by mouth daily.  05/02/16   [provider]  clonazePAM (KLONOPIN) 0.5 MG tablet Take 0.25 mg by mouth 2 (two) times daily as needed for anxiety.    [provider]  diclofenac sodium (VOLTAREN) 1 % GEL Apply 2 g topically 4 (four) times daily. 02/25/12 03/03/19  [provider]  dicyclomine (BENTYL) 20 MG tablet Take 20 mg by mouth daily as needed for spasms.    [provider]  doxycycline (VIBRA-TABS) 100 MG tablet Take 100 mg by mouth 2 (two) times daily.    [provider]  famotidine (PEPCID) 40 MG tablet Take 1 tablet (40 mg total) by mouth every evening. 11/24/16 08/12/18  Nance Pear, MD  hydrOXYzine (ATARAX/VISTARIL) 25 MG tablet Take 25 mg by mouth daily as needed. 05/02/16   [provider]  losartan (COZAAR) 100 MG tablet Take  100 mg by mouth daily. 02/01/16 08/12/18  [provider]  rizatriptan (MAXALT-MLT) 10 MG disintegrating tablet Take 10 mg by mouth daily as needed. 12/04/17   [provider]  traZODone (DESYREL) 50 MG tablet Take 50 mg by mouth at bedtime as needed for sleep.    [provider]    Allergies Prednisone; Diphenhydramine hcl; Other; Oxycodone-acetaminophen; Codeine; Diphenhydramine; Etodolac; Hydrocodone; Meperidine; and Venlafaxine    Social History Social History   Tobacco Use  . Smoking status: Former Research scientist (life sciences)  . Smokeless tobacco: Never  Used  Substance Use Topics  . Alcohol use: No  . Drug use: No    Review of Systems Patient denies headaches, rhinorrhea, blurry vision, numbness, shortness of breath, chest pain, edema, cough, abdominal pain, nausea, vomiting, diarrhea, dysuria, fevers, rashes or hallucinations unless otherwise stated above in HPI. ____________________________________________   PHYSICAL EXAM:  VITAL SIGNS: Vitals:   11/26/18 1325  BP: (!) 182/111  Pulse: 96  Resp: 16  Temp: 98.6 F (37 C)  SpO2: 98%    Constitutional: Alert and oriented. Well appearing and in no acute distress. Eyes: Conjunctivae are normal.  Head: Atraumatic. Nose: No congestion/rhinnorhea. Mouth/Throat: Mucous membranes are moist.  No bleeding Neck: Painless ROM.  Cardiovascular:   Good peripheral circulation. Respiratory: Normal respiratory effort.  No retractions.  Gastrointestinal: Soft and nontender.  Musculoskeletal: No lower extremity tenderness, abrasion small laceration well approximated on left posterior calf overlying varicose vein.  Currently hemostatic.  No joint effusions. Neurologic:  Normal speech and language. No gross focal neurologic deficits are appreciated.  Skin:  Skin is warm, dry Psychiatric: Mood and affect are normal. Speech and behavior are normal.  ____________________________________________   LABS (all labs ordered are listed, but only abnormal results are displayed)  No results found for this or any previous visit (from the past 24 hour(s)). ____________________________________________  EKG____________________________________________  RADIOLOGY   ____________________________________________   PROCEDURES  Procedure(s) performed:  Marland KitchenMarland KitchenLaceration Repair Date/Time: 11/26/2018 1:41 PM Performed by: Merlyn Lot, MD Authorized by: Merlyn Lot, MD   Consent:    Consent obtained:  Verbal   Consent given by:  Patient   Risks discussed:  Infection, pain, retained foreign  body, poor cosmetic result and poor wound healing Anesthesia (see MAR for exact dosages):    Anesthesia method:  None Laceration details:    Location:  Leg   Leg location:  L lower leg   Length (cm):  0.1   Depth (mm):  1 Repair type:    Repair type:  Simple Treatment:    Visualized foreign bodies/material removed: no   Skin repair:    Repair method:  Tissue adhesive Approximation:    Approximation:  Close Post-procedure details:    Dressing:  Sterile dressing   Patient tolerance of procedure:  Tolerated well, no immediate complications      Critical Care performed: no ____________________________________________   INITIAL IMPRESSION / ASSESSMENT AND PLAN / ED COURSE  Pertinent labs & imaging results that were available during my care of the patient were reviewed by me and considered in my medical decision making (see chart for details).  DDX: Laceration, abrasion, contusion.  Andrea Ford is a 68 y.o. who presents to the ED with symptoms of bleeding varicose vein.  No evidence of spontaneous or nontraumatic hemorrhage or petechia.  No signs or symptoms of thrombocytopenia.  Do not feel that further diagnostic testing clinically indicated as the bleeding is secondary to skin abrasion versus tiny laceration was controlled with Dermabond.  No signs or symptoms of acute blood loss anemia.  Patient otherwise stable and appropriate for outpatient follow-up.     The patient was evaluated in Emergency Department today for the symptoms described in the history of present illness. He/she was evaluated in the context of the global COVID-19 pandemic, which necessitated consideration that the patient might be at risk for infection with the SARS-CoV-2 virus that causes COVID-19. Institutional protocols and algorithms that pertain to the evaluation of patients at risk for COVID-19 are in a state of rapid change based on information released by regulatory bodies including the CDC and  federal and state organizations. These policies and algorithms were followed during the patient's care in the ED.  ____________________________________________   FINAL CLINICAL IMPRESSION(S) / ED DIAGNOSES  Final diagnoses:  Bleeding from varicose vein      NEW MEDICATIONS STARTED DURING THIS VISIT:  New Prescriptions   No medications on file     Note:  This document was prepared using Dragon voice recognition software and may include unintentional dictation errors.     Merlyn Lot, MD 11/26/18 1336    Merlyn Lot, MD 11/26/18 1342    Merlyn Lot, MD 11/26/18 1343

## 2018-11-26 NOTE — ED Notes (Signed)

## 2018-11-27 ENCOUNTER — Other Ambulatory Visit (INDEPENDENT_AMBULATORY_CARE_PROVIDER_SITE_OTHER): Payer: Self-pay | Admitting: Nurse Practitioner

## 2018-11-27 DIAGNOSIS — I83813 Varicose veins of bilateral lower extremities with pain: Secondary | ICD-10-CM

## 2018-11-30 ENCOUNTER — Encounter (INDEPENDENT_AMBULATORY_CARE_PROVIDER_SITE_OTHER): Payer: Self-pay | Admitting: Nurse Practitioner

## 2018-11-30 ENCOUNTER — Ambulatory Visit (INDEPENDENT_AMBULATORY_CARE_PROVIDER_SITE_OTHER): Payer: Medicare Other | Admitting: Nurse Practitioner

## 2018-11-30 ENCOUNTER — Other Ambulatory Visit: Payer: Self-pay

## 2018-11-30 ENCOUNTER — Ambulatory Visit (INDEPENDENT_AMBULATORY_CARE_PROVIDER_SITE_OTHER): Payer: Medicare Other

## 2018-11-30 VITALS — BP 168/92 | HR 98 | Resp 16 | Ht 61.5 in | Wt 157.0 lb

## 2018-11-30 DIAGNOSIS — K219 Gastro-esophageal reflux disease without esophagitis: Secondary | ICD-10-CM

## 2018-11-30 DIAGNOSIS — I83893 Varicose veins of bilateral lower extremities with other complications: Secondary | ICD-10-CM

## 2018-11-30 DIAGNOSIS — I8311 Varicose veins of right lower extremity with inflammation: Secondary | ICD-10-CM | POA: Insufficient documentation

## 2018-11-30 DIAGNOSIS — Z87891 Personal history of nicotine dependence: Secondary | ICD-10-CM | POA: Diagnosis not present

## 2018-11-30 DIAGNOSIS — I83813 Varicose veins of bilateral lower extremities with pain: Secondary | ICD-10-CM

## 2018-11-30 DIAGNOSIS — I10 Essential (primary) hypertension: Secondary | ICD-10-CM

## 2018-11-30 DIAGNOSIS — Z79899 Other long term (current) drug therapy: Secondary | ICD-10-CM

## 2018-11-30 NOTE — Progress Notes (Signed)
SUBJECTIVE:  Patient ID: Andrea Ford, female    DOB: 1950-11-15, 68 y.o.   MRN: [id number] Chief Complaint  Patient presents with  . New Patient (Initial Visit)    HPI  Andrea Ford is a 68 y.o. female The patient is seen for evaluation of symptomatic varicose veins. The patient relates burning and stinging which worsened steadily throughout the course of the day, particularly with standing. The patient also notes an aching and throbbing pain over the varicosities, particularly with prolonged dependent positions. The symptoms are significantly improved with elevation.  The patient also notes that during hot weather the symptoms are greatly intensified. The patient states the pain from the varicose veins interferes with work, daily exercise, shopping and household maintenance. At this point, the symptoms are persistent and severe enough that they're having a negative impact on lifestyle and are interfering with daily activities.  Also, the patient has recently had acute hemorrhage from her varicose veins of her left lower extremity recently.  She recently visited the emergency room on 11/24/2018 as well as on 11/26/2018 due to bleeding from her varicosities.  On the last visit she had Dermabond placed which has since helped stop the bleeding.  However the possibility of a subsequent bleed makes the patient extremely anxious.  There is no history of DVT, PE or superficial thrombophlebitis. The patient endorses a significant family history of varicose veins. OB history: 1  The patient has worn graduated compression in the past.  However at this time she is concerned about wearing medical grade 1 compression stockings due to the possibility of reinjuring the bleeding varicosity.  At the present time the patient has not been using over-the-counter analgesics. There is no history of prior surgical intervention or sclerotherapy.  The patient underwent noninvasive studies today which revealed that  she had no evidence of DVT, superficial venous thrombosis, or chronic venous insufficiency in either lower extremity.   Past Medical History:  Diagnosis Date  . Asthma   . COPD (chronic obstructive pulmonary disease) ([place])   . Hypertension   . Migraine     Past Surgical History:  Procedure Laterality Date  . ANKLE SURGERY Right   . CARDIAC CATHETERIZATION Right 07/15/2016   Procedure: Left Heart Cath and Coronary Angiography;  Surgeon: Dionisio David, MD;  Location: [place] [facility];  Service: Cardiovascular;  Laterality: Right;  . CHOLECYSTECTOMY    . TUBAL LIGATION      Social History   Socioeconomic History  . Marital status: Divorced    Spouse name: Not on file  . Number of children: Not on file  . Years of education: Not on file  . Highest education level: Not on file  Occupational History  . Not on file  Social Needs  . Financial resource strain: Not on file  . Food insecurity:    Worry: Not on file    Inability: Not on file  . Transportation needs:    Medical: Not on file    Non-medical: Not on file  Tobacco Use  . Smoking status: Former Research scientist (life sciences)  . Smokeless tobacco: Never Used  Substance and Sexual Activity  . Alcohol use: No  . Drug use: No  . Sexual activity: Not on file  Lifestyle  . Physical activity:    Days per week: Not on file    Minutes per session: Not on file  . Stress: Not on file  Relationships  . Social connections:    Talks on phone: Not  on file    Gets together: Not on file    Attends religious service: Not on file    Active member of club or organization: Not on file    Attends meetings of clubs or organizations: Not on file    Relationship status: Not on file  . Intimate partner violence:    Fear of current or ex partner: Not on file    Emotionally abused: Not on file    Physically abused: Not on file    Forced sexual activity: Not on file  Other Topics Concern  . Not on file  Social History Narrative  . Not on file     Family History  Problem Relation Age of Onset  . Hypertension Mother   . Migraines Mother   . Cancer Mother   . Varicose Veins Mother   . Heart disease Father   . Arthritis Father   . Hypertension Father   . Diabetes Maternal Uncle   . Stroke Maternal Grandmother   . Stroke Maternal Grandfather   . Stroke Paternal Grandmother   . Stroke Paternal Grandfather     Allergies  Allergen Reactions  . Prednisone Rash and Other (See Comments)    hyper   . Diphenhydramine Hcl Other (See Comments)  . Other Other (See Comments)    Pet dander  . Oxycodone-Acetaminophen   . Codeine Itching and Other (See Comments)  . Diphenhydramine Anxiety  . Etodolac Rash, Nausea And Vomiting and Other (See Comments)  . Hydrocodone Itching  . Meperidine Rash and Other (See Comments)    Pt. Not aware  Of ever having any demerol   . Venlafaxine Rash and Other (See Comments)     Review of Systems   Review of Systems: Negative Unless Checked Constitutional: [] Weight loss  [] Fever  [] Chills Cardiac: [] Chest pain   []  Atrial Fibrillation  [] Palpitations   [] Shortness of breath when laying flat   [] Shortness of breath with exertion. [] Shortness of breath at rest Vascular:  [x] Pain in legs with walking   [x] Pain in legs with standing [] Pain in legs when laying flat   [] Claudication    [] Pain in feet when laying flat    [] History of DVT   [] Phlebitis   [x] Swelling in legs   [x] Varicose veins   [] Non-healing ulcers Pulmonary:   [] Uses home oxygen   [] Productive cough   [] Hemoptysis   [] Wheeze  [] COPD   [] Asthma Neurologic:  [] Dizziness   [] Seizures  [] Blackouts [] History of stroke   [] History of TIA  [] Aphasia   [] Temporary Blindness   [] Weakness or numbness in arm   [] Weakness or numbness in leg Musculoskeletal:   [] Joint swelling   [] Joint pain   [] Low back pain  []  History of Knee Replacement [] Arthritis [] back Surgeries  []  Spinal Stenosis    Hematologic:  [] Easy bruising  [x] Easy bleeding    [] Hypercoagulable state   [] Anemic Gastrointestinal:  [] Diarrhea   [] Vomiting  [] Gastroesophageal reflux/heartburn   [] Difficulty swallowing. [] Abdominal pain Genitourinary:  [] Chronic kidney disease   [] Difficult urination  [] Anuric   [] Blood in urine [] Frequent urination  [] Burning with urination   [] Hematuria Skin:  [] Rashes   [] Ulcers [x] Wounds Psychological:  [x] History of anxiety   []  History of major depression  []  Memory Difficulties      OBJECTIVE:   Physical Exam  BP (!) 168/92 (BP Location: Right Arm)   Pulse 98   Resp 16   Ht 5' 1.5" (1.562 m)   Wt 157 lb (71.2 kg)  BMI 29.18 kg/m   Gen: WD/WN, NAD Head: [place]/AT, No temporalis wasting.  Ear/Nose/Throat: Hearing grossly intact, nares w/o erythema or drainage Eyes: PER, EOMI, sclera nonicteric.  Neck: Supple, no masses.  No JVD.  Pulmonary:  Good air movement, no use of accessory muscles.  Cardiac: RRR Vascular: scattered varicosities present bilaterally.  Mild venous stasis changes to the legs bilaterally.  2+ soft pitting edema  Vessel Right Left  Radial Palpable Palpable   Gastrointestinal: soft, non-distended. No guarding/no peritoneal signs.  Musculoskeletal: M/S 5/5 throughout.  No deformity or atrophy.  Neurologic: Pain and light touch intact in extremities.  Symmetrical.  Speech is fluent. Motor exam as listed above. Psychiatric: Judgment intact, Mood & affect appropriate for pt's clinical situation. Dermatologic: No Venous rashes. No Ulcers Noted.  No changes consistent with cellulitis. Lymph : No Cervical lymphadenopathy, no lichenification or skin changes of chronic lymphedema.       ASSESSMENT AND PLAN:  1. Varicose veins of bilateral lower extremities with other complications Recommend:  The patient has had a hemorrhagic event with varicose veins on her left lower extremity.  I feel that it is in her best interest for [place] to treat these with sclerotherapy as she has had repeated episodes.  This is  over a possibly larger event in the future.  Patient should undergo injection sclerotherapy to treat the residual varicosities.  The risks, benefits and alternative therapies were reviewed in detail with the patient.  All questions were answered.  The patient agrees to proceed with sclerotherapy at their convenience.  The patient will continue wearing the graduated compression stockings and using the over-the-counter pain medications to treat her symptoms.       2. Essential hypertension Continue antihypertensive medications as already ordered, these medications have been reviewed and there are no changes at this time.   3. GERD without esophagitis Continue PPI as already ordered, this medication has been reviewed and there are no changes at this time.  Avoidence of caffeine and alcohol  Moderate elevation of the head of the bed    Current Outpatient Medications on File Prior to Visit  Medication Sig Dispense Refill  . albuterol (PROVENTIL HFA;VENTOLIN HFA) 108 (90 BASE) MCG/ACT inhaler Inhale into the lungs every 6 (six) hours as needed for wheezing or shortness of breath.    [name] albuterol (PROVENTIL) (2.5 MG/3ML) 0.083% nebulizer solution Take 3 mLs (2.5 mg total) by nebulization every 6 (six) hours as needed for wheezing or shortness of breath. 75 mL 0  . carvedilol (COREG) 6.25 MG tablet Take 6.25 mg by mouth at bedtime.    . Cholecalciferol (VITAMIN D-3 PO) Take 1 tablet by mouth daily.    . citalopram (CELEXA) 20 MG tablet Take 20 mg by mouth daily.     . clonazePAM (KLONOPIN) 0.5 MG tablet Take 0.25 mg by mouth 2 (two) times daily as needed for anxiety.    . diclofenac sodium (VOLTAREN) 1 % GEL Apply 2 g topically 4 (four) times daily.    [name] dicyclomine (BENTYL) 20 MG tablet Take 20 mg by mouth daily as needed for spasms.    [name] doxycycline (VIBRA-TABS) 100 MG tablet Take 100 mg by mouth 2 (two) times daily.    . hydrOXYzine (ATARAX/VISTARIL) 25 MG tablet Take 25 mg by mouth daily  as needed.    . rizatriptan (MAXALT-MLT) 10 MG disintegrating tablet Take 10 mg by mouth daily as needed.    . traZODone (DESYREL) 50 MG tablet Take 50 mg by  mouth at bedtime as needed for sleep.    [name] atorvastatin (LIPITOR) 40 MG tablet Take 1 tablet (40 mg total) by mouth daily at 6 PM. (Patient not taking: Reported on 11/30/2018) 30 tablet 2  . famotidine (PEPCID) 40 MG tablet Take 1 tablet (40 mg total) by mouth every evening. 30 tablet 1  . losartan (COZAAR) 100 MG tablet Take 100 mg by mouth daily.     No current facility-administered medications on file prior to visit.     There are no Patient Instructions on file for this visit. No follow-ups on file.   Kris Hartmann, NP  This note was completed with Sales executive.  Any errors are purely unintentional.

## 2018-12-18 ENCOUNTER — Encounter (INDEPENDENT_AMBULATORY_CARE_PROVIDER_SITE_OTHER): Payer: Self-pay | Admitting: Nurse Practitioner

## 2018-12-18 ENCOUNTER — Other Ambulatory Visit: Payer: Self-pay

## 2018-12-18 ENCOUNTER — Ambulatory Visit (INDEPENDENT_AMBULATORY_CARE_PROVIDER_SITE_OTHER): Payer: Medicare Other | Admitting: Nurse Practitioner

## 2018-12-18 VITALS — BP 186/122 | HR 78 | Resp 10 | Ht 61.5 in | Wt 157.0 lb

## 2018-12-18 DIAGNOSIS — I83893 Varicose veins of bilateral lower extremities with other complications: Secondary | ICD-10-CM | POA: Diagnosis not present

## 2018-12-18 NOTE — Progress Notes (Signed)
Varicose veins of left lower extremity with inflammation (454.1  I83.10) Current Plans   Indication: Patient presents with symptomatic varicose veins of the left lower extremity.   Procedure: Sclerotherapy using hypertonic saline mixed with 1% Lidocaine was performed on the left lower extremity. Compression wraps were placed. The patient tolerated the procedure well. 

## 2018-12-21 ENCOUNTER — Ambulatory Visit (INDEPENDENT_AMBULATORY_CARE_PROVIDER_SITE_OTHER): Payer: Medicare Other

## 2018-12-21 ENCOUNTER — Encounter (INDEPENDENT_AMBULATORY_CARE_PROVIDER_SITE_OTHER): Payer: Self-pay | Admitting: Nurse Practitioner

## 2018-12-21 ENCOUNTER — Ambulatory Visit (INDEPENDENT_AMBULATORY_CARE_PROVIDER_SITE_OTHER): Payer: Medicare Other | Admitting: Nurse Practitioner

## 2018-12-21 ENCOUNTER — Other Ambulatory Visit: Payer: Self-pay

## 2018-12-21 VITALS — BP 175/101 | HR 84 | Resp 16 | Ht 61.5 in | Wt 153.8 lb

## 2018-12-21 DIAGNOSIS — M79604 Pain in right leg: Secondary | ICD-10-CM

## 2018-12-21 DIAGNOSIS — I83813 Varicose veins of bilateral lower extremities with pain: Secondary | ICD-10-CM | POA: Diagnosis not present

## 2018-12-21 DIAGNOSIS — Z87891 Personal history of nicotine dependence: Secondary | ICD-10-CM

## 2018-12-21 DIAGNOSIS — M79605 Pain in left leg: Secondary | ICD-10-CM

## 2018-12-21 DIAGNOSIS — K219 Gastro-esophageal reflux disease without esophagitis: Secondary | ICD-10-CM

## 2018-12-21 DIAGNOSIS — Z79899 Other long term (current) drug therapy: Secondary | ICD-10-CM

## 2018-12-21 DIAGNOSIS — I10 Essential (primary) hypertension: Secondary | ICD-10-CM | POA: Diagnosis not present

## 2018-12-21 DIAGNOSIS — I83893 Varicose veins of bilateral lower extremities with other complications: Secondary | ICD-10-CM

## 2018-12-21 DIAGNOSIS — M79606 Pain in leg, unspecified: Secondary | ICD-10-CM | POA: Insufficient documentation

## 2018-12-21 NOTE — Progress Notes (Signed)
SUBJECTIVE:  Patient ID: Andrea Ford, female    DOB: 10-14-1950, 68 y.o.   MRN: [id number] Chief Complaint  Patient presents with  . Follow-up    ultrasound follow up    HPI  Andrea Ford is a 68 y.o. female The patient is seen for evaluation of symptomatic varicose veins. The patient relates burning and stinging which worsened steadily throughout the course of the day, particularly with standing.  Patient has a long previous history of working on concrete floors.  The patient also notes an aching and throbbing pain over the varicosities, particularly with prolonged dependent positions. The symptoms are significantly improved with elevation.  The patient also notes that during hot weather the symptoms are greatly intensified. The patient states the pain from the varicose veins interferes with work, daily exercise, shopping and household maintenance. At this point, the symptoms are persistent and severe enough that they're having a negative impact on lifestyle and are interfering with daily activities.  Patient has also had a incident of a repeatedly hemorrhaging varicose vein on her left lower extremity which required sutures to stop.  She has been treated once with emergent sclerotherapy.  There is no history of DVT, PE or superficial thrombophlebitis. The patient denies a significant family history of varicose veins.   The patient recently began wearing medical grade 1 compression stockings on her previous visit, for 16 2020.  She wears these compression stockings on a daily basis.  She places them in the morning and removes them in the evening.  She also endorses elevating her lower extremities as much as possible as well as using NSAIDs for pain with exercise.  Today the patient underwent a noninvasive study which revealed reflux within the bilateral great saphenous veins.  There was no evidence of DVT or superficial venous thrombosis bilaterally.  Patient also underwent ABIs today  which was 1.08 bilaterally.  The patient also had triphasic tibial artery waveforms bilaterally with strong toe waveforms.    Past Medical History:  Diagnosis Date  . Asthma   . COPD (chronic obstructive pulmonary disease) ([place])   . Hypertension   . Migraine     Past Surgical History:  Procedure Laterality Date  . ANKLE SURGERY Right   . CARDIAC CATHETERIZATION Right 07/15/2016   Procedure: Left Heart Cath and Coronary Angiography;  Surgeon: Dionisio David, MD;  Location: [facility];  Service: Cardiovascular;  Laterality: Right;  . CHOLECYSTECTOMY    . TUBAL LIGATION      Social History   Socioeconomic History  . Marital status: Divorced    Spouse name: Not on file  . Number of children: Not on file  . Years of education: Not on file  . Highest education level: Not on file  Occupational History  . Not on file  Social Needs  . Financial resource strain: Not on file  . Food insecurity:    Worry: Not on file    Inability: Not on file  . Transportation needs:    Medical: Not on file    Non-medical: Not on file  Tobacco Use  . Smoking status: Former Research scientist (life sciences)  . Smokeless tobacco: Never Used  Substance and Sexual Activity  . Alcohol use: No  . Drug use: No  . Sexual activity: Not on file  Lifestyle  . Physical activity:    Days per week: Not on file    Minutes per session: Not on file  . Stress: Not on file  Relationships  .  Social connections:    Talks on phone: Not on file    Gets together: Not on file    Attends religious service: Not on file    Active member of club or organization: Not on file    Attends meetings of clubs or organizations: Not on file    Relationship status: Not on file  . Intimate partner violence:    Fear of current or ex partner: Not on file    Emotionally abused: Not on file    Physically abused: Not on file    Forced sexual activity: Not on file  Other Topics Concern  . Not on file  Social History Narrative  . Not on file     Family History  Problem Relation Age of Onset  . Hypertension Mother   . Migraines Mother   . Cancer Mother   . Varicose Veins Mother   . Heart disease Father   . Arthritis Father   . Hypertension Father   . Diabetes Maternal Uncle   . Stroke Maternal Grandmother   . Stroke Maternal Grandfather   . Stroke Paternal Grandmother   . Stroke Paternal Grandfather     Allergies  Allergen Reactions  . Prednisone Rash and Other (See Comments)    hyper   . Diphenhydramine Hcl Other (See Comments)  . Other Other (See Comments)    Pet dander  . Oxycodone-Acetaminophen   . Codeine Itching and Other (See Comments)  . Diphenhydramine Anxiety  . Etodolac Rash, Nausea And Vomiting and Other (See Comments)  . Hydrocodone Itching  . Meperidine Rash and Other (See Comments)    Pt. Not aware  Of ever having any demerol   . Venlafaxine Rash and Other (See Comments)     Review of Systems   Review of Systems: Negative Unless Checked Constitutional: [] Weight loss  [] Fever  [] Chills Cardiac: [] Chest pain   []  Atrial Fibrillation  [] Palpitations   [] Shortness of breath when laying flat   [] Shortness of breath with exertion. [] Shortness of breath at rest Vascular:  [] Pain in legs with walking   [] Pain in legs with standing [] Pain in legs when laying flat   [] Claudication    [] Pain in feet when laying flat    [] History of DVT   [] Phlebitis   [x] Swelling in legs   [x] Varicose veins   [] Non-healing ulcers Pulmonary:   [] Uses home oxygen   [] Productive cough   [] Hemoptysis   [] Wheeze  [x] COPD   [] Asthma Neurologic:  [] Dizziness   [] Seizures  [] Blackouts [] History of stroke   [] History of TIA  [] Aphasia   [] Temporary Blindness   [] Weakness or numbness in arm   [] Weakness or numbness in leg Musculoskeletal:   [] Joint swelling   [] Joint pain   [] Low back pain  []  History of Knee Replacement [] Arthritis [] back Surgeries  []  Spinal Stenosis    Hematologic:  [] Easy bruising  [] Easy bleeding    [] Hypercoagulable state   [] Anemic Gastrointestinal:  [] Diarrhea   [] Vomiting  [x] Gastroesophageal reflux/heartburn   [] Difficulty swallowing. [] Abdominal pain Genitourinary:  [] Chronic kidney disease   [] Difficult urination  [] Anuric   [] Blood in urine [] Frequent urination  [] Burning with urination   [] Hematuria Skin:  [] Rashes   [] Ulcers [] Wounds Psychological:  [] History of anxiety   []  History of major depression  []  Memory Difficulties      OBJECTIVE:   Physical Exam  BP (!) 175/101 (BP Location: Right Arm)   Pulse 84   Resp 16   Ht 5' 1.5" (1.562  m)   Wt 153 lb 12.8 oz (69.8 kg)   BMI 28.59 kg/m   Gen: WD/WN, NAD Head: [place]/AT, No temporalis wasting.  Ear/Nose/Throat: Hearing grossly intact, nares w/o erythema or drainage Eyes: PER, EOMI, sclera nonicteric.  Neck: Supple, no masses.  No JVD.  Pulmonary:  Good air movement, no use of accessory muscles.  Cardiac: RRR Vascular:  Scattered varicosities bilaterally Vessel Right Left  Radial Palpable Palpable  Dorsalis Pedis Palpable Palpable  Posterior Tibial Palpable Palpable   Gastrointestinal: soft, non-distended. No guarding/no peritoneal signs.  Musculoskeletal: M/S 5/5 throughout.  No deformity or atrophy.  Neurologic: Pain and light touch intact in extremities.  Symmetrical.  Speech is fluent. Motor exam as listed above. Psychiatric: Judgment intact, Mood & affect appropriate for pt's clinical situation. Dermatologic: No Venous rashes. No Ulcers Noted.  No changes consistent with cellulitis. Lymph : No Cervical lymphadenopathy, no lichenification or skin changes of chronic lymphedema.       ASSESSMENT AND PLAN:  1. Varicose veins of bilateral lower extremities with other complications  Recommend:  The patient has large symptomatic varicose veins that are painful and associated with swelling.  I have had a long discussion with the patient regarding  varicose veins and why they cause symptoms.  Patient will  continue wearing graduated compression stockings class 1 on a daily basis, beginning first thing in the morning and removing them in the evening. The patient is instructed specifically not to sleep in the stockings.    The patient  will also continue using over-the-counter analgesics such as Motrin 600 mg po TID to help control the symptoms.    In addition, behavioral modification including elevation during the day will be continued   Further plans will be based on the ultrasound results and whether conservative therapies are successful at eliminating the pain and swelling.   2. GERD without esophagitis Continue PPI as already ordered, this medication has been reviewed and there are no changes at this time.  Avoidence of caffeine and alcohol  Moderate elevation of the head of the bed   3. Essential hypertension Continue antihypertensive medications as already ordered, these medications have been reviewed and there are no changes at this time.   4. Pain in both lower extremities This pain is likely attributed to venous causes as well as some of her fibromyalgia.  Based on ABIs this is not arterial.  See above for plan.   Current Outpatient Medications on File Prior to Visit  Medication Sig Dispense Refill  . albuterol (PROVENTIL HFA;VENTOLIN HFA) 108 (90 BASE) MCG/ACT inhaler Inhale into the lungs every 6 (six) hours as needed for wheezing or shortness of breath.    [name] albuterol (PROVENTIL) (2.5 MG/3ML) 0.083% nebulizer solution Take 3 mLs (2.5 mg total) by nebulization every 6 (six) hours as needed for wheezing or shortness of breath. 75 mL 0  . carvedilol (COREG) 6.25 MG tablet Take 6.25 mg by mouth at bedtime.    . Cholecalciferol (VITAMIN D-3 PO) Take 1 tablet by mouth daily.    . citalopram (CELEXA) 20 MG tablet Take 20 mg by mouth daily.     . clonazePAM (KLONOPIN) 0.5 MG tablet Take 0.25 mg by mouth 2 (two) times daily as needed for anxiety.    . diclofenac sodium (VOLTAREN) 1 %  GEL Apply 2 g topically 4 (four) times daily.    [name] dicyclomine (BENTYL) 20 MG tablet Take 20 mg by mouth daily as needed for spasms.    [name] doxycycline (  VIBRA-TABS) 100 MG tablet Take 100 mg by mouth 2 (two) times daily.    . hydrOXYzine (ATARAX/VISTARIL) 25 MG tablet Take 25 mg by mouth daily as needed.    . rizatriptan (MAXALT-MLT) 10 MG disintegrating tablet Take 10 mg by mouth daily as needed.    . traZODone (DESYREL) 50 MG tablet Take 50 mg by mouth at bedtime as needed for sleep.    [name] atorvastatin (LIPITOR) 40 MG tablet Take 1 tablet (40 mg total) by mouth daily at 6 PM. (Patient not taking: Reported on 11/30/2018) 30 tablet 2  . famotidine (PEPCID) 40 MG tablet Take 1 tablet (40 mg total) by mouth every evening. 30 tablet 1  . losartan (COZAAR) 100 MG tablet Take 100 mg by mouth daily.     No current facility-administered medications on file prior to visit.     There are no Patient Instructions on file for this visit. No follow-ups on file.   Kris Hartmann, NP  This note was completed with Sales executive.  Any errors are purely unintentional.

## 2018-12-29 ENCOUNTER — Telehealth (INDEPENDENT_AMBULATORY_CARE_PROVIDER_SITE_OTHER): Payer: Self-pay | Admitting: Nurse Practitioner

## 2018-12-29 NOTE — Telephone Encounter (Signed)
Patient is calling stating she still has knots on her L leg from sclero done in office. Stating that her leg is painful and there is swelling. Requesting to speak with Arna Medici or to have apt to be seen. Please advise. AS, CMA

## 2018-12-29 NOTE — Telephone Encounter (Signed)
Patient called back to our office. I have attempted to call patient back again with no answer. Left message for her to return my call. AS< CMA

## 2018-12-29 NOTE — Telephone Encounter (Signed)
Left message for patient to call back. AS, CMA 

## 2018-12-29 NOTE — Telephone Encounter (Signed)
Patient is aware of the below and verbalized understanding. AS, CMA 

## 2018-12-29 NOTE — Telephone Encounter (Signed)
This is common after schlerotherapy.  She actually had knots when I saw her at her ultrasound visit.  These typically take at least 4 weeks to resolve from the time of schlerotherapy.  These are not blood clots, they are not dangerous, they are not life-threatening.  She should wear her compression socks and elevate her legs.  She should also walk at least 30 minutes per day.  There is no intervention in office that we will do to get rid of the swelling or knots, she simply needs to allow her body time to heal.  She can keep her same appointment on 03/01/2019, we do not need to move it up.

## 2019-02-10 ENCOUNTER — Telehealth (INDEPENDENT_AMBULATORY_CARE_PROVIDER_SITE_OTHER): Payer: Self-pay | Admitting: Nurse Practitioner

## 2019-02-10 NOTE — Telephone Encounter (Signed)
What problems are the patient having ?

## 2019-02-10 NOTE — Telephone Encounter (Signed)
Andrea Ford knows about her

## 2019-02-11 ENCOUNTER — Other Ambulatory Visit (INDEPENDENT_AMBULATORY_CARE_PROVIDER_SITE_OTHER): Payer: Self-pay | Admitting: Nurse Practitioner

## 2019-02-11 ENCOUNTER — Emergency Department
Admission: EM | Admit: 2019-02-11 | Discharge: 2019-02-11 | Disposition: A | Discharge status: Home / Self Care | Payer: Medicare Other | Attending: Emergency Medicine | Admitting: Emergency Medicine

## 2019-02-11 ENCOUNTER — Other Ambulatory Visit: Payer: Self-pay

## 2019-02-11 ENCOUNTER — Encounter: Payer: Self-pay | Admitting: Emergency Medicine

## 2019-02-11 ENCOUNTER — Ambulatory Visit (INDEPENDENT_AMBULATORY_CARE_PROVIDER_SITE_OTHER): Payer: Medicare Other | Admitting: Nurse Practitioner

## 2019-02-11 DIAGNOSIS — Z79899 Other long term (current) drug therapy: Secondary | ICD-10-CM | POA: Insufficient documentation

## 2019-02-11 DIAGNOSIS — J449 Chronic obstructive pulmonary disease, unspecified: Secondary | ICD-10-CM | POA: Insufficient documentation

## 2019-02-11 DIAGNOSIS — Z87891 Personal history of nicotine dependence: Secondary | ICD-10-CM | POA: Insufficient documentation

## 2019-02-11 DIAGNOSIS — I10 Essential (primary) hypertension: Secondary | ICD-10-CM | POA: Diagnosis not present

## 2019-02-11 DIAGNOSIS — R51 Headache: Secondary | ICD-10-CM | POA: Diagnosis present

## 2019-02-11 LAB — BASIC METABOLIC PANEL
Anion gap: 10 (ref 5–15)
BUN: 15 mg/dL (ref 8–23)
CO2: 25 mmol/L (ref 22–32)
Calcium: 9.1 mg/dL (ref 8.9–10.3)
Chloride: 104 mmol/L (ref 98–111)
Creatinine, Ser: 0.54 mg/dL (ref 0.44–1.00)
GFR calc Af Amer: >60 mL/min (ref >60)
GFR calc non Af Amer: >60 mL/min (ref >60)
Glucose, Bld: 96 mg/dL (ref 70–99)
Potassium: 3.5 mmol/L (ref 3.5–5.1)
Sodium: 139 mmol/L (ref 135–145)

## 2019-02-11 LAB — CBC
HCT: 41.8 % (ref 36.0–46.0)
Hemoglobin: 14.2 g/dL (ref 12.0–15.0)
MCH: 29.2 pg (ref 26.0–34.0)
MCHC: 34 g/dL (ref 30.0–36.0)
MCV: 85.8 fL (ref 80.0–100.0)
Platelets: 267 10*3/uL (ref 150–400)
RBC: 4.87 MIL/uL (ref 3.87–5.11)
RDW: 11.8 % (ref 11.5–15.5)
WBC: 6.4 10*3/uL (ref 4.0–10.5)
nRBC: 0 % (ref 0.0–0.2)

## 2019-02-11 MED ORDER — ACETAMINOPHEN 325 MG PO TABS
650.0000 mg | ORAL_TABLET | Freq: Once | ORAL | Status: AC
  Administered 2019-02-11: 650 mg via ORAL
  Filled 2019-02-11: qty 2

## 2019-02-11 NOTE — ED Triage Notes (Addendum)
Patient reports headaches for the last few weeks. States for the last week she has noticed her BP has been increased at home. Patient reports increased stress at home. Patient appears very anxious in triage. Patient reports taking half of a 0.5 klonopin prior to arrival.  Patient also states she started smoking again 3 months. Patient states she has history of migraines but this feels different. Patient also reports recent surgery on her eyes which resulted in her starting on several new eye drops.

## 2019-02-12 NOTE — ED Provider Notes (Signed)
[facility] Emergency Department Provider Note   ____________________________________________    I have reviewed the triage vital signs and the nursing notes.   HISTORY  Chief Complaint Hypertension and Headache     HPI Andrea Ford is a 68 y.o. female with history of high blood pressure who presents today with complaints of elevated blood pressure and headache.  Patient reports she became concerned when her blood pressure had 426 systolic at that time she noticed that she had a moderate throbbing headache.  She reports headache is greatly improved and she is pleased that blood pressure has come down in the emergency department.  She has been working with her PCP on her blood pressure for some time.  She reports she recently started smoking again because of the pandemic.  No neuro deficits.  No chest pain no shortness of breath.  Past Medical History:  Diagnosis Date  . Asthma   . COPD (chronic obstructive pulmonary disease) ([place])   . Hypertension   . Migraine     Patient Active Problem List   Diagnosis Date Noted  . Leg pain 12/21/2018  . Varicose veins of bilateral lower extremities with other complications 83/41/9622  . Accelerated hypertension 07/13/2016  . Chest pain 07/13/2016  . Abnormal EKG 07/13/2016  . GAD (generalized anxiety disorder) 07/13/2016  . Obstructive airway disease ([place]) 11/02/2015  . Essential hypertension 02/06/2015  . GERD without esophagitis 01/26/2003    Past Surgical History:  Procedure Laterality Date  . ANKLE SURGERY Right   . CARDIAC CATHETERIZATION Right 07/15/2016   Procedure: Left Heart Cath and Coronary Angiography;  Surgeon: Dionisio David, MD;  Location: [facility];  Service: Cardiovascular;  Laterality: Right;  . CHOLECYSTECTOMY    . TUBAL LIGATION      Prior to Admission medications   Medication Sig Start Date End Date Taking? Authorizing Provider  albuterol (PROVENTIL HFA;VENTOLIN HFA)  108 (90 BASE) MCG/ACT inhaler Inhale into the lungs every 6 (six) hours as needed for wheezing or shortness of breath.    [provider]  albuterol (PROVENTIL) (2.5 MG/3ML) 0.083% nebulizer solution Take 3 mLs (2.5 mg total) by nebulization every 6 (six) hours as needed for wheezing or shortness of breath. 06/25/18   Carrie Mew, MD  atorvastatin (LIPITOR) 40 MG tablet Take 1 tablet (40 mg total) by mouth daily at 6 PM. Patient not taking: Reported on 11/30/2018 07/15/16   Demetrios Loll, MD  carvedilol (COREG) 6.25 MG tablet Take 6.25 mg by mouth at bedtime.    [provider]  Cholecalciferol (VITAMIN D-3 PO) Take 1 tablet by mouth daily.    [provider]  citalopram (CELEXA) 20 MG tablet Take 20 mg by mouth daily.  05/02/16   [provider]  clonazePAM (KLONOPIN) 0.5 MG tablet Take 0.25 mg by mouth 2 (two) times daily as needed for anxiety.    [provider]  diclofenac sodium (VOLTAREN) 1 % GEL Apply 2 g topically 4 (four) times daily. 02/25/12 03/03/19  [provider]  dicyclomine (BENTYL) 20 MG tablet Take 20 mg by mouth daily as needed for spasms.    [provider]  doxycycline (VIBRA-TABS) 100 MG tablet Take 100 mg by mouth 2 (two) times daily.    [provider]  famotidine (PEPCID) 40 MG tablet Take 1 tablet (40 mg total) by mouth every evening. 11/24/16 08/12/18  Nance Pear, MD  hydrOXYzine (ATARAX/VISTARIL) 25 MG tablet Take 25 mg by mouth  daily as needed. 05/02/16   [provider]  losartan (COZAAR) 100 MG tablet Take 100 mg by mouth daily. 02/01/16 08/12/18  [provider]  rizatriptan (MAXALT-MLT) 10 MG disintegrating tablet Take 10 mg by mouth daily as needed. 12/04/17   [provider]  traZODone (DESYREL) 50 MG tablet Take 50 mg by mouth at bedtime as needed for sleep.    [provider]     Allergies Prednisone, Diphenhydramine hcl, Other, Oxycodone-acetaminophen,  Codeine, Diphenhydramine, Etodolac, Hydrocodone, Meperidine, and Venlafaxine  Family History  Problem Relation Age of Onset  . Hypertension Mother   . Migraines Mother   . Cancer Mother   . Varicose Veins Mother   . Heart disease Father   . Arthritis Father   . Hypertension Father   . Diabetes Maternal Uncle   . Stroke Maternal Grandmother   . Stroke Maternal Grandfather   . Stroke Paternal Grandmother   . Stroke Paternal Grandfather     Social History Social History   Tobacco Use  . Smoking status: Former Research scientist (life sciences)  . Smokeless tobacco: Never Used  Substance Use Topics  . Alcohol use: No  . Drug use: No    Review of Systems  Constitutional: No fever/chills Eyes: No visual changes.  ENT: No sore throat. Cardiovascular: Denies chest pain. Respiratory: Denies shortness of breath. Gastrointestinal: No abdominal pain.  No nausea, no vomiting.   Genitourinary: Negative for dysuria. Musculoskeletal: Negative for back pain. Skin: Negative for rash. Neurological: As above   ____________________________________________   PHYSICAL EXAM:  VITAL SIGNS: ED Triage Vitals  Enc Vitals Group     BP 02/11/19 1841 (!) 183/106     Pulse Rate 02/11/19 1841 79     Resp 02/11/19 1841 (!) 22     Temp 02/11/19 1841 98.3 F (36.8 C)     Temp Source 02/11/19 1841 Oral     SpO2 02/11/19 1841 98 %     Weight 02/11/19 1842 72.6 kg (160 lb)     Height 02/11/19 1842 1.549 m (5\' 1" )     Head Circumference --      Peak Flow --      Pain Score 02/11/19 1842 8     Pain Loc --      Pain Edu? --      Excl. in [place]? --     Constitutional: Alert and oriented. No acute distress. Pleasant and interactive    Nose: No congestion/rhinnorhea.  Cardiovascular: Normal rate, regular rhythm. Grossly normal heart sounds.  Good peripheral circulation. Respiratory: Normal respiratory effort.  No retractions. Lungs CTAB. Gastrointestinal: Soft and nontender. No distention.  No CVA tenderness.   Musculoskeletal: No lower extremity tenderness nor edema.  Warm and well perfused Neurologic:  Normal speech and language. No gross focal neurologic deficits are appreciated.  Skin:  Skin is warm, dry and intact. No rash noted. Psychiatric: Mood and affect are normal. Speech and behavior are normal.  ____________________________________________   LABS (all labs ordered are listed, but only abnormal results are displayed)  Labs Reviewed  BASIC METABOLIC PANEL  CBC   ____________________________________________  EKG ED ECG REPORT I, Lavonia Drafts, the attending physician, personally viewed and interpreted this ECG.  Date: 02/12/2019  Rhythm: normal sinus rhythm QRS Axis: normal Intervals: normal ST/T Wave abnormalities:  nonspecific T wave changes Narrative Interpretation: no evidence of acute ischemia  ____________________________________________  RADIOLOGY  None ____________________________________________   PROCEDURES  Procedure(s) performed: No  Procedures   Critical Care performed: No ____________________________________________  INITIAL IMPRESSION / ASSESSMENT AND PLAN / ED COURSE  Pertinent labs & imaging results that were available during my care of the patient were reviewed by me and considered in my medical decision making (see chart for details).  Patient well-appearing and in no acute distress.  Exam is quite reassuring.  Blood pressure is trending down.  Recommended we give her p.o. clonidine here in the emergency department but she said that she had that at home and would like to go home and take that now that her blood pressure is improving and her headache is resolving.  She will follow-up with PCP    ____________________________________________   FINAL CLINICAL IMPRESSION(S) / ED DIAGNOSES  Final diagnoses:  Essential hypertension        Note:  This document was prepared using Dragon voice recognition software and may include  unintentional dictation errors.   Lavonia Drafts, MD 02/12/19 [id number]

## 2019-02-28 ENCOUNTER — Emergency Department: Payer: Medicare Other

## 2019-02-28 ENCOUNTER — Other Ambulatory Visit: Payer: Self-pay

## 2019-02-28 DIAGNOSIS — R0789 Other chest pain: Secondary | ICD-10-CM | POA: Diagnosis not present

## 2019-02-28 DIAGNOSIS — Z79899 Other long term (current) drug therapy: Secondary | ICD-10-CM | POA: Insufficient documentation

## 2019-02-28 DIAGNOSIS — J45909 Unspecified asthma, uncomplicated: Secondary | ICD-10-CM | POA: Insufficient documentation

## 2019-02-28 DIAGNOSIS — R079 Chest pain, unspecified: Secondary | ICD-10-CM | POA: Diagnosis present

## 2019-02-28 DIAGNOSIS — J449 Chronic obstructive pulmonary disease, unspecified: Secondary | ICD-10-CM | POA: Diagnosis not present

## 2019-02-28 DIAGNOSIS — Z87891 Personal history of nicotine dependence: Secondary | ICD-10-CM | POA: Insufficient documentation

## 2019-02-28 DIAGNOSIS — I10 Essential (primary) hypertension: Secondary | ICD-10-CM | POA: Insufficient documentation

## 2019-02-28 LAB — BASIC METABOLIC PANEL
Anion gap: 9 (ref 5–15)
BUN: 14 mg/dL (ref 8–23)
CO2: 24 mmol/L (ref 22–32)
Calcium: 9.3 mg/dL (ref 8.9–10.3)
Chloride: 105 mmol/L (ref 98–111)
Creatinine, Ser: 0.7 mg/dL (ref 0.44–1.00)
GFR calc Af Amer: >60 mL/min (ref >60)
GFR calc non Af Amer: >60 mL/min (ref >60)
Glucose, Bld: 98 mg/dL (ref 70–99)
Potassium: 3.5 mmol/L (ref 3.5–5.1)
Sodium: 138 mmol/L (ref 135–145)

## 2019-02-28 LAB — CBC
HCT: 43.9 % (ref 36.0–46.0)
Hemoglobin: 14.7 g/dL (ref 12.0–15.0)
MCH: 29.2 pg (ref 26.0–34.0)
MCHC: 33.5 g/dL (ref 30.0–36.0)
MCV: 87.1 fL (ref 80.0–100.0)
Platelets: 274 10*3/uL (ref 150–400)
RBC: 5.04 MIL/uL (ref 3.87–5.11)
RDW: 11.8 % (ref 11.5–15.5)
WBC: 6.2 10*3/uL (ref 4.0–10.5)
nRBC: 0 % (ref 0.0–0.2)

## 2019-02-28 LAB — TROPONIN I (HIGH SENSITIVITY): Troponin I (High Sensitivity): <2 ng/L (ref <18)

## 2019-02-28 MED ORDER — SODIUM CHLORIDE 0.9% FLUSH: 3.0000 mL | Freq: Once | INTRAVENOUS | Status: DC

## 2019-02-28 NOTE — ED Triage Notes (Signed)
Patient reports having chest pain most of the afternoon.  Reports she has been running errand and got really hot.  Reports pain in center of chest and radiates into her back.

## 2019-03-01 ENCOUNTER — Encounter (INDEPENDENT_AMBULATORY_CARE_PROVIDER_SITE_OTHER): Payer: Self-pay | Admitting: Vascular Surgery

## 2019-03-01 ENCOUNTER — Ambulatory Visit (INDEPENDENT_AMBULATORY_CARE_PROVIDER_SITE_OTHER): Payer: Medicare Other | Admitting: Vascular Surgery

## 2019-03-01 ENCOUNTER — Emergency Department
Admission: EM | Admit: 2019-03-01 | Discharge: 2019-03-01 | Disposition: A | Discharge status: Home / Self Care | Payer: Medicare Other | Attending: Emergency Medicine | Admitting: Emergency Medicine

## 2019-03-01 VITALS — BP 175/110 | HR 90 | Resp 16 | Wt 154.4 lb

## 2019-03-01 DIAGNOSIS — K219 Gastro-esophageal reflux disease without esophagitis: Secondary | ICD-10-CM | POA: Diagnosis not present

## 2019-03-01 DIAGNOSIS — R0789 Other chest pain: Secondary | ICD-10-CM | POA: Diagnosis not present

## 2019-03-01 DIAGNOSIS — I10 Essential (primary) hypertension: Secondary | ICD-10-CM | POA: Diagnosis not present

## 2019-03-01 DIAGNOSIS — I8312 Varicose veins of left lower extremity with inflammation: Secondary | ICD-10-CM

## 2019-03-01 DIAGNOSIS — Z87891 Personal history of nicotine dependence: Secondary | ICD-10-CM

## 2019-03-01 DIAGNOSIS — J449 Chronic obstructive pulmonary disease, unspecified: Secondary | ICD-10-CM

## 2019-03-01 DIAGNOSIS — Z79899 Other long term (current) drug therapy: Secondary | ICD-10-CM

## 2019-03-01 DIAGNOSIS — I8311 Varicose veins of right lower extremity with inflammation: Secondary | ICD-10-CM | POA: Diagnosis not present

## 2019-03-01 LAB — FIBRIN DERIVATIVES D-DIMER (ARMC ONLY): Fibrin derivatives D-dimer (ARMC): 323.13 ng/mL (FEU) (ref 0.00–499.00)

## 2019-03-01 LAB — TROPONIN I (HIGH SENSITIVITY): Troponin I (High Sensitivity): 3 ng/L (ref <18)

## 2019-03-01 MED ORDER — ACETAMINOPHEN 500 MG PO TABS
1000.0000 mg | ORAL_TABLET | Freq: Once | ORAL | Status: AC
  Administered 2019-03-01: 1000 mg via ORAL
  Filled 2019-03-01: qty 2

## 2019-03-01 MED ORDER — ALUM & MAG HYDROXIDE-SIMETH 200-200-20 MG/5ML PO SUSP
30.0000 mL | Freq: Once | ORAL | Status: AC
  Administered 2019-03-01: 30 mL via ORAL
  Filled 2019-03-01: qty 30

## 2019-03-01 MED ORDER — LIDOCAINE VISCOUS HCL 2 % MT SOLN
15.0000 mL | Freq: Once | OROMUCOSAL | Status: AC
  Administered 2019-03-01: 15 mL via ORAL
  Filled 2019-03-01: qty 15

## 2019-03-01 MED ORDER — FAMOTIDINE 40 MG PO TABS: 40.0000 mg | ORAL_TABLET | Freq: Every day | ORAL | 1 refills | Status: AC

## 2019-03-01 NOTE — ED Notes (Signed)
Pt observed walking out the front door, no distress noted

## 2019-03-01 NOTE — Progress Notes (Signed)
MRN : [id number]  Andrea Ford is a 68 y.o. ([birth year]) female who presents with chief complaint of  Chief Complaint  Patient presents with   Follow-up    discuss laser  .  History of Present Illness:   The patient returns to the office for follow-up and reevaluation of her varicose veins.  This is a 62-month follow-up.  The patient relates burning and stinging which worsened steadily throughout the course of the day, particularly with standing.  Patient has a long previous history of working on concrete floors.  The patient also notes an aching and throbbing pain over the varicosities, particularly with prolonged dependent positions. The symptoms are significantly improved with elevation.  The patient also notes that during hot weather the symptoms are greatly intensified. The patient states the pain from the varicose veins interferes with work, daily exercise, shopping and household maintenance. At this point, the symptoms are persistent and severe enough that they're having a negative impact on lifestyle and are interfering with daily activities.  Patient has also had a incident of a repeatedly hemorrhaging varicose vein on her left lower extremity which required sutures to stop.  She has been treated once with emergent sclerotherapy.  There is no history of DVT, PE or superficial thrombophlebitis. The patient denies a significant family history of varicose veins.   The patient recently began wearing medical grade 1 compression stockings on her previous visit, for 16 2020.  She wears these compression stockings on a daily basis.  She places them in the morning and removes them in the evening.  She also endorses elevating her lower extremities as much as possible as well as using NSAIDs for pain with exercise.  Previous noninvasive studies which revealed reflux within the bilateral great saphenous veins.  There was no evidence of DVT or superficial venous thrombosis bilaterally.      Previous ABIs today which was 1.08 bilaterally.  The patient also has triphasic tibial artery waveforms bilaterally with strong toe waveforms.  Current Meds  Medication Sig   albuterol (PROVENTIL HFA;VENTOLIN HFA) 108 (90 BASE) MCG/ACT inhaler Inhale into the lungs every 6 (six) hours as needed for wheezing or shortness of breath.   albuterol (PROVENTIL) (2.5 MG/3ML) 0.083% nebulizer solution Take 3 mLs (2.5 mg total) by nebulization every 6 (six) hours as needed for wheezing or shortness of breath.   amoxicillin (AMOXIL) 500 MG capsule TAKE 1 CAPSULE BY MOUTH 4 TIMES DAILY FOR 10 DAYS   busPIRone (BUSPAR) 10 MG tablet    Cholecalciferol (VITAMIN D-3 PO) Take 1 tablet by mouth daily.   citalopram (CELEXA) 20 MG tablet Take 20 mg by mouth daily.    clonazePAM (KLONOPIN) 0.5 MG tablet Take 0.25 mg by mouth 2 (two) times daily as needed for anxiety.   diclofenac sodium (VOLTAREN) 1 % GEL Apply 2 g topically 4 (four) times daily.   famotidine (PEPCID) 40 MG tablet Take 1 tablet (40 mg total) by mouth at bedtime.   furosemide (LASIX) 20 MG tablet Take 20 mg by mouth daily.   hydrOXYzine (ATARAX/VISTARIL) 25 MG tablet Take 25 mg by mouth daily as needed.   rizatriptan (MAXALT-MLT) 10 MG disintegrating tablet Take 10 mg by mouth daily as needed.   traZODone (DESYREL) 50 MG tablet Take 50 mg by mouth at bedtime as needed for sleep.    Past Medical History:  Diagnosis Date   Asthma    COPD (chronic obstructive pulmonary disease) ([place])    Hypertension  Migraine     Past Surgical History:  Procedure Laterality Date   ANKLE SURGERY Right    CARDIAC CATHETERIZATION Right 07/15/2016   Procedure: Left Heart Cath and Coronary Angiography;  Surgeon: Dionisio David, MD;  Location: [facility];  Service: Cardiovascular;  Laterality: Right;   CHOLECYSTECTOMY     TUBAL LIGATION      Social History Social History   Tobacco Use   Smoking status: Former Smoker    Smokeless tobacco: Never Used  Substance Use Topics   Alcohol use: No   Drug use: No    Family History Family History  Problem Relation Age of Onset   Hypertension Mother    Migraines Mother    Cancer Mother    Varicose Veins Mother    Heart disease Father    Arthritis Father    Hypertension Father    Diabetes Maternal Uncle    Stroke Maternal Grandmother    Stroke Maternal Grandfather    Stroke Paternal Grandmother    Stroke Paternal Grandfather     Allergies  Allergen Reactions   Prednisone Rash and Other (See Comments)    hyper    Diphenhydramine Hcl Other (See Comments)   Other Other (See Comments)    Pet dander   Oxycodone-Acetaminophen    Codeine Itching and Other (See Comments)   Diphenhydramine Anxiety   Etodolac Rash, Nausea And Vomiting and Other (See Comments)   Hydrocodone Itching   Meperidine Rash and Other (See Comments)    Pt. Not aware  Of ever having any demerol    Venlafaxine Rash and Other (See Comments)     REVIEW OF SYSTEMS (Negative unless checked)  Constitutional: [] Weight loss  [] Fever  [] Chills Cardiac: [] Chest pain   [] Chest pressure   [] Palpitations   [] Shortness of breath when laying flat   [] Shortness of breath with exertion. Vascular:  [] Pain in legs with walking   [x] Pain in legs at rest  [] History of DVT   [] Phlebitis   [x] Swelling in legs   [x] Varicose veins   [] Non-healing ulcers Pulmonary:   [] Uses home oxygen   [] Productive cough   [] Hemoptysis   [] Wheeze  [x] COPD   [] Asthma Neurologic:  [] Dizziness   [] Seizures   [] History of stroke   [] History of TIA  [] Aphasia   [] Vissual changes   [] Weakness or numbness in arm   [] Weakness or numbness in leg Musculoskeletal:   [] Joint swelling   [] Joint pain   [] Low back pain Hematologic:  [] Easy bruising  [] Easy bleeding   [] Hypercoagulable state   [] Anemic Gastrointestinal:  [] Diarrhea   [] Vomiting  [x] Gastroesophageal reflux/heartburn   [] Difficulty  swallowing. Genitourinary:  [] Chronic kidney disease   [] Difficult urination  [] Frequent urination   [] Blood in urine Skin:  [x] Rashes   [] Ulcers  Psychological:  [] History of anxiety   []  History of major depression.  Physical Examination  Vitals:   03/01/19 1048  BP: (!) 175/110  Pulse: 90  Resp: 16  Weight: 154 lb 6.4 oz (70 kg)   Body mass index is 28.7 kg/m. Gen: WD/WN, NAD Head: [place]/AT, No temporalis wasting.  Ear/Nose/Throat: Hearing grossly intact, nares w/o erythema or drainage Eyes: PER, EOMI, sclera nonicteric.  Neck: Supple, no large masses.   Pulmonary:  Good air movement, no audible wheezing bilaterally, no use of accessory muscles.  Cardiac: RRR, no JVD Vascular: Large varicosities present extensively greater than 10 mm bilaterally.  Mild venous stasis changes to the legs bilaterally.  1-2+ soft pitting edema Vessel  Right Left  PT Palpable Palpable  DP Palpable Palpable  Gastrointestinal: Non-distended. No guarding/no peritoneal signs.  Musculoskeletal: M/S 5/5 throughout.  No deformity or atrophy.  Neurologic: CN 2-12 intact. Symmetrical.  Speech is fluent. Motor exam as listed above. Psychiatric: Judgment intact, Mood & affect appropriate for pt's clinical situation. Dermatologic: Mild venous rashes no ulcers noted.  No changes consistent with cellulitis. Lymph : No lichenification or skin changes of chronic lymphedema.  CBC Lab Results  Component Value Date   WBC 6.2 02/28/2019   HGB 14.7 02/28/2019   HCT 43.9 02/28/2019   MCV 87.1 02/28/2019   PLT 274 02/28/2019    BMET    Component Value Date/Time   NA 138 02/28/2019 1917   NA 141 11/02/2014 0937   K 3.5 02/28/2019 1917   K 4.0 11/02/2014 0937   CL 105 02/28/2019 1917   CL 107 11/02/2014 0937   CO2 24 02/28/2019 1917   CO2 26 11/02/2014 0937   GLUCOSE 98 02/28/2019 1917   GLUCOSE 124 (H) 11/02/2014 0937   BUN 14 02/28/2019 1917   BUN 16 11/02/2014 0937   CREATININE 0.70 02/28/2019 1917    CREATININE 0.70 11/02/2014 0937   CALCIUM 9.3 02/28/2019 1917   CALCIUM 9.2 11/02/2014 0937   GFRNONAA >60 02/28/2019 1917   GFRNONAA >60 11/02/2014 0937   GFRAA >60 02/28/2019 1917   GFRAA >60 11/02/2014 0937   Estimated Creatinine Clearance: 63.5 mL/min (by C-G formula based on SCr of 0.7 mg/dL).  COAG Lab Results  Component Value Date   INR 0.91 08/12/2018   INR 0.94 07/13/2016    Radiology Dg Chest 2 View  Result Date: 02/28/2019 CLINICAL DATA:  Chest pain EXAM: CHEST - 2 VIEW COMPARISON:  October 04, 2018 FINDINGS: Again noted is increased attenuation overlying the right lower and right mid lung zone, favored to be secondary to an underlying breast implant. There is no pneumothorax. No large pleural effusion. There is atelectasis versus scarring at the left lung base and in the left mid lung zone. IMPRESSION: No active cardiopulmonary disease. Electronically Signed   By: Constance Holster M.D.   On: 02/28/2019 19:56      Assessment/Plan 1. Varicose veins of both lower extremities with inflammation Recommend  I have reviewed my previous  discussion with the patient regarding  varicose veins and why they cause symptoms. Patient will continue  wearing graduated compression stockings class 1 on a daily basis, beginning first thing in the morning and removing them in the evening.    In addition, behavioral modification including elevation during the day was again discussed and this will continue.  The patient has utilized over the counter pain medications and has been exercising.  However, at this time conservative therapy has not alleviated the patient's symptoms of leg pain and swelling  Recommend: laser ablation of the right and  left great saphenous veins to eliminate the symptoms of pain and swelling of the lower extremities caused by the severe superficial venous reflux disease.  The left leg is more affected and therefore will be treated first. Recommend  I have  reviewed my previous  discussion with the patient regarding  varicose veins and why they cause symptoms. Patient will continue  wearing graduated compression stockings class 1 on a daily basis, beginning first thing in the morning and removing them in the evening.    In addition, behavioral modification including elevation during the day was again discussed and this will continue.  The patient has  utilized over the counter pain medications and has been exercising.  However, at this time conservative therapy has not alleviated the patient's symptoms of leg pain and swelling  Recommend: laser ablation of the right and  left great saphenous veins to eliminate the symptoms of pain and swelling of the lower extremities caused by the severe superficial venous reflux disease.    A total of 35 minutes was spent with this patient and greater than 50% was spent in counseling and coordination of care with the patient.  Discussion included the treatment options for vascular disease including indications for surgery and intervention.  Also discussed is the appropriate timing of treatment.  In addition medical therapy was discussed.  2. Accelerated hypertension Continue antihypertensive medications as already ordered, these medications have been reviewed and there are no changes at this time.   3. GERD without esophagitis Continue PPI as already ordered, this medication has been reviewed and there are no changes at this time.  Avoidence of caffeine and alcohol  Moderate elevation of the head of the bed   4. Chronic obstructive pulmonary disease, unspecified COPD type ([place]) Continue pulmonary medications and aerosols as already ordered, these medications have been reviewed and there are no changes at this time.      Hortencia Pilar, MD  03/01/2019 12:29 PM

## 2019-03-01 NOTE — ED Provider Notes (Signed)
[facility] Emergency Department Provider Note  ____________________________________________  Time seen: Approximately 12:44 AM  I have reviewed the triage vital signs and the nursing notes.   HISTORY  Chief Complaint Chest Pain   HPI Andrea Ford is a 68 y.o. female history of asthma, COPD, hypertension, smoking, GERD who presents for evaluation of chest pain.  Patient reports that she was shopping at [employer] earlier this afternoon when she started having chest pain.  The pain is in one exact location in the center of her chest, reproducible with palpation, soreness in quality.  She went to visit her son and bring him a cake for his birthday and after eating the cake the pain felt worse.  She thought it was due to her reflux so she took some soda to help her burp.  After burping she felt slightly better but the pain was persistent which prompted her visit to the emergency room.  While in the waiting room she had some crackers which made the pain worse.  She has had some nausea associated with this pain.  No fever or chills, no dizziness, no vomiting, no abdominal pain, no SOB or cough.  No personal family history of blood clots, recent travel mobilization, leg pain or swelling, hemoptysis, exogenous hormones.  No personal history of heart attack.  The pain is persistent and mild in intensity.   Past Medical History:  Diagnosis Date  . Asthma   . COPD (chronic obstructive pulmonary disease) ([place])   . Hypertension   . Migraine     Patient Active Problem List   Diagnosis Date Noted  . Leg pain 12/21/2018  . Varicose veins of bilateral lower extremities with other complications 67/61/9509  . Accelerated hypertension 07/13/2016  . Chest pain 07/13/2016  . Abnormal EKG 07/13/2016  . GAD (generalized anxiety disorder) 07/13/2016  . Obstructive airway disease ([place]) 11/02/2015  . Essential hypertension 02/06/2015  . GERD without esophagitis 01/26/2003     Past Surgical History:  Procedure Laterality Date  . ANKLE SURGERY Right   . CARDIAC CATHETERIZATION Right 07/15/2016   Procedure: Left Heart Cath and Coronary Angiography;  Surgeon: Dionisio David, MD;  Location: [place] [facility];  Service: Cardiovascular;  Laterality: Right;  . CHOLECYSTECTOMY    . TUBAL LIGATION      Prior to Admission medications   Medication Sig Start Date End Date Taking? Authorizing Provider  albuterol (PROVENTIL HFA;VENTOLIN HFA) 108 (90 BASE) MCG/ACT inhaler Inhale into the lungs every 6 (six) hours as needed for wheezing or shortness of breath.    [provider]  albuterol (PROVENTIL) (2.5 MG/3ML) 0.083% nebulizer solution Take 3 mLs (2.5 mg total) by nebulization every 6 (six) hours as needed for wheezing or shortness of breath. 06/25/18   Carrie Mew, MD  atorvastatin (LIPITOR) 40 MG tablet Take 1 tablet (40 mg total) by mouth daily at 6 PM. Patient not taking: Reported on 11/30/2018 07/15/16   Demetrios Loll, MD  carvedilol (COREG) 6.25 MG tablet Take 6.25 mg by mouth at bedtime.    [provider]  Cholecalciferol (VITAMIN D-3 PO) Take 1 tablet by mouth daily.    [provider]  citalopram (CELEXA) 20 MG tablet Take 20 mg by mouth daily.  05/02/16   [provider]  clonazePAM (KLONOPIN) 0.5 MG tablet Take 0.25 mg by mouth 2 (two) times daily as needed for anxiety.    [provider]  diclofenac sodium (VOLTAREN) 1 % GEL Apply 2 g  topically 4 (four) times daily. 02/25/12 03/03/19  [provider]  dicyclomine (BENTYL) 20 MG tablet Take 20 mg by mouth daily as needed for spasms.    [provider]  doxycycline (VIBRA-TABS) 100 MG tablet Take 100 mg by mouth 2 (two) times daily.    [provider]  famotidine (PEPCID) 40 MG tablet Take 1 tablet (40 mg total) by mouth at bedtime. 03/01/19 02/29/20  Rudene Re, MD  hydrOXYzine (ATARAX/VISTARIL) 25 MG tablet Take 25 mg by mouth daily as  needed. 05/02/16   [provider]  losartan (COZAAR) 100 MG tablet Take 100 mg by mouth daily. 02/01/16 08/12/18  [provider]  rizatriptan (MAXALT-MLT) 10 MG disintegrating tablet Take 10 mg by mouth daily as needed. 12/04/17   [provider]  traZODone (DESYREL) 50 MG tablet Take 50 mg by mouth at bedtime as needed for sleep.    [provider]    Allergies Prednisone, Diphenhydramine hcl, Other, Oxycodone-acetaminophen, Codeine, Diphenhydramine, Etodolac, Hydrocodone, Meperidine, and Venlafaxine  Family History  Problem Relation Age of Onset  . Hypertension Mother   . Migraines Mother   . Cancer Mother   . Varicose Veins Mother   . Heart disease Father   . Arthritis Father   . Hypertension Father   . Diabetes Maternal Uncle   . Stroke Maternal Grandmother   . Stroke Maternal Grandfather   . Stroke Paternal Grandmother   . Stroke Paternal Grandfather     Social History Social History   Tobacco Use  . Smoking status: Former Research scientist (life sciences)  . Smokeless tobacco: Never Used  Substance Use Topics  . Alcohol use: No  . Drug use: No    Review of Systems  Constitutional: Negative for fever. Eyes: Negative for visual changes. ENT: Negative for sore throat. Neck: No neck pain  Cardiovascular: + chest pain. Respiratory: Negative for shortness of breath. Gastrointestinal: Negative for abdominal pain, vomiting or diarrhea. + nausea Genitourinary: Negative for dysuria. Musculoskeletal: Negative for back pain. Skin: Negative for rash. Neurological: Negative for headaches, weakness or numbness. Psych: No SI or HI  ____________________________________________   PHYSICAL EXAM:  VITAL SIGNS: ED Triage Vitals  Enc Vitals Group     BP 02/28/19 1910 (!) 173/92     Pulse Rate 02/28/19 1910 100     Resp 02/28/19 1910 18     Temp 02/28/19 1910 98.1 F (36.7 C)     Temp Source 02/28/19 1910 Oral     SpO2 --      Weight 02/28/19 1906 163 lb (73.9  kg)     Height 02/28/19 1906 5' 1.5" (1.562 m)     Head Circumference --      Peak Flow --      Pain Score 02/28/19 1905 9     Pain Loc --      Pain Edu? --      Excl. in [place]? --     Constitutional: Alert and oriented. Well appearing and in no apparent distress. HEENT:      Head: Normocephalic and atraumatic.         Eyes: Conjunctivae are normal. Sclera is non-icteric.       Mouth/Throat: Mucous membranes are moist.       Neck: Supple with no signs of meningismus. Cardiovascular: Regular rate and rhythm. No murmurs, gallops, or rubs. 2+ symmetrical distal pulses are present in all extremities. No JVD. Palpation of the rib cage anteriorly reproduces the pain Respiratory: Normal respiratory effort. Lungs are  clear to auscultation bilaterally. No wheezes, crackles, or rhonchi.  Gastrointestinal: Soft, non tender, and non distended with positive bowel sounds. No rebound or guarding. Musculoskeletal: Nontender with normal range of motion in all extremities. No edema, cyanosis, or erythema of extremities. Neurologic: Normal speech and language. Face is symmetric. Moving all extremities. No gross focal neurologic deficits are appreciated. Skin: Skin is warm, dry and intact. No rash noted. Psychiatric: Mood and affect are normal. Speech and behavior are normal.  ____________________________________________   LABS (all labs ordered are listed, but only abnormal results are displayed)  Labs Reviewed  BASIC METABOLIC PANEL  CBC  FIBRIN DERIVATIVES D-DIMER ([facility] ONLY)  TROPONIN I (HIGH SENSITIVITY)  TROPONIN I (HIGH SENSITIVITY)   ____________________________________________  EKG  ED ECG REPORT I, Rudene Re, the attending physician, personally viewed and interpreted this ECG.  Sinus tachycardia, rate of 105, normal intervals, normal axis, no ST elevations or depressions.  Unchanged from prior. ____________________________________________  RADIOLOGY  I have personally  reviewed the images performed during this visit and I agree with the Radiologist's read.   Interpretation by Radiologist:  Dg Chest 2 View  Result Date: 02/28/2019 CLINICAL DATA:  Chest pain EXAM: CHEST - 2 VIEW COMPARISON:  October 04, 2018 FINDINGS: Again noted is increased attenuation overlying the right lower and right mid lung zone, favored to be secondary to an underlying breast implant. There is no pneumothorax. No large pleural effusion. There is atelectasis versus scarring at the left lung base and in the left mid lung zone. IMPRESSION: No active cardiopulmonary disease. Electronically Signed   By: Constance Holster M.D.   On: 02/28/2019 19:56     ____________________________________________   PROCEDURES  Procedure(s) performed: None Procedures Critical Care performed:  None ____________________________________________   INITIAL IMPRESSION / ASSESSMENT AND PLAN / ED COURSE   68 y.o. female history of asthma, COPD, hypertension, smoking, GERD who presents for evaluation of constant central soreness in her chest which is reproducible with palpation and worse with eating. Ddx GERD vs MSK vs costochondritis. Less likely PE however due to tachycardia d-dimer was sent. Low suspicion for ACS based on history and exam. EKG non ischemic. HS troponin x 2 negative. CXR negative for PTX, edema, PNA. Patient given GI cocktail and tylenol.   Clinical Course as of Feb 28 234  Mon Mar 01, 2019  0232 D-dimer negative.  Troponin x2-.  Labs with no acute findings.  Chest x-ray with no acute findings.  Patient reports significant relief after GI cocktail.  Will send home with a prescription for Pepcid for possible reflux.  Recommended taking Tylenol for the pain and follow-up with PCP.  Discussed my standard return precautions.   [CV]    Clinical Course User Index [CV] Alfred Levins [place], MD      As part of my medical decision making, I reviewed the following data within the [place] notes reviewed and incorporated, Labs reviewed , EKG interpreted , Old chart reviewed, Radiograph reviewed , Notes from prior ED visits and [place] Controlled Substance Database   Patient was evaluated in Emergency Department today for the symptoms described in the history of present illness. Patient was evaluated in the context of the global COVID-19 pandemic, which necessitated consideration that the patient might be at risk for infection with the SARS-CoV-2 virus that causes COVID-19. Institutional protocols and algorithms that pertain to the evaluation of patients at risk for COVID-19 are in a state of rapid change based on information released by  regulatory bodies including the CDC and federal and state organizations. These policies and algorithms were followed during the patient's care in the ED.   ____________________________________________   FINAL CLINICAL IMPRESSION(S) / ED DIAGNOSES   Final diagnoses:  Atypical chest pain      NEW MEDICATIONS STARTED DURING THIS VISIT:  ED Discharge Orders         Ordered    famotidine (PEPCID) 40 MG tablet  Daily at bedtime     03/01/19 0234           Note:  This document was prepared using Dragon voice recognition software and may include unintentional dictation errors.    Alfred Levins, [place], MD 03/01/19 [phone]

## 2019-03-01 NOTE — ED Notes (Signed)
Pt to the first nurse desk asking how many patients are ahead of her, pt reassurred that according to wait time she is the next to go back, but that we go by acuity, pt understanding and states that she has a ride and just needs to update them

## 2019-03-16 DIAGNOSIS — K6389 Other specified diseases of intestine: Secondary | ICD-10-CM | POA: Insufficient documentation

## 2019-03-16 DIAGNOSIS — K582 Mixed irritable bowel syndrome: Secondary | ICD-10-CM | POA: Insufficient documentation

## 2019-06-29 DIAGNOSIS — M810 Age-related osteoporosis without current pathological fracture: Secondary | ICD-10-CM | POA: Insufficient documentation

## 2019-07-12 DIAGNOSIS — M797 Fibromyalgia: Secondary | ICD-10-CM | POA: Insufficient documentation

## 2019-07-26 ENCOUNTER — Ambulatory Visit (INDEPENDENT_AMBULATORY_CARE_PROVIDER_SITE_OTHER): Payer: Medicare Other | Admitting: Vascular Surgery

## 2019-07-26 ENCOUNTER — Other Ambulatory Visit: Payer: Self-pay

## 2019-07-26 ENCOUNTER — Encounter (INDEPENDENT_AMBULATORY_CARE_PROVIDER_SITE_OTHER): Payer: Self-pay | Admitting: Vascular Surgery

## 2019-07-26 VITALS — BP 177/103 | HR 88 | Resp 16 | Wt 141.0 lb

## 2019-07-26 DIAGNOSIS — I8311 Varicose veins of right lower extremity with inflammation: Secondary | ICD-10-CM | POA: Diagnosis not present

## 2019-07-26 DIAGNOSIS — I10 Essential (primary) hypertension: Secondary | ICD-10-CM | POA: Diagnosis not present

## 2019-07-26 DIAGNOSIS — I8312 Varicose veins of left lower extremity with inflammation: Secondary | ICD-10-CM

## 2019-07-26 DIAGNOSIS — J45909 Unspecified asthma, uncomplicated: Secondary | ICD-10-CM

## 2019-07-26 DIAGNOSIS — M79604 Pain in right leg: Secondary | ICD-10-CM | POA: Diagnosis not present

## 2019-07-26 DIAGNOSIS — M79605 Pain in left leg: Secondary | ICD-10-CM

## 2019-08-02 ENCOUNTER — Other Ambulatory Visit (INDEPENDENT_AMBULATORY_CARE_PROVIDER_SITE_OTHER): Payer: Self-pay | Admitting: Vascular Surgery

## 2019-08-02 DIAGNOSIS — I83813 Varicose veins of bilateral lower extremities with pain: Secondary | ICD-10-CM

## 2019-08-03 ENCOUNTER — Ambulatory Visit (INDEPENDENT_AMBULATORY_CARE_PROVIDER_SITE_OTHER): Payer: Medicare Other | Admitting: Nurse Practitioner

## 2019-08-03 ENCOUNTER — Encounter (INDEPENDENT_AMBULATORY_CARE_PROVIDER_SITE_OTHER): Payer: Medicare Other

## 2019-08-04 ENCOUNTER — Ambulatory Visit (INDEPENDENT_AMBULATORY_CARE_PROVIDER_SITE_OTHER): Payer: Medicare Other

## 2019-08-04 ENCOUNTER — Ambulatory Visit (INDEPENDENT_AMBULATORY_CARE_PROVIDER_SITE_OTHER): Payer: Medicare Other | Admitting: Nurse Practitioner

## 2019-08-04 ENCOUNTER — Encounter (INDEPENDENT_AMBULATORY_CARE_PROVIDER_SITE_OTHER): Payer: Self-pay | Admitting: Nurse Practitioner

## 2019-08-04 ENCOUNTER — Other Ambulatory Visit: Payer: Self-pay

## 2019-08-04 VITALS — BP 139/84 | HR 69 | Resp 16

## 2019-08-04 DIAGNOSIS — M79605 Pain in left leg: Secondary | ICD-10-CM

## 2019-08-04 DIAGNOSIS — M797 Fibromyalgia: Secondary | ICD-10-CM | POA: Diagnosis not present

## 2019-08-04 DIAGNOSIS — I8312 Varicose veins of left lower extremity with inflammation: Secondary | ICD-10-CM

## 2019-08-04 DIAGNOSIS — M79604 Pain in right leg: Secondary | ICD-10-CM | POA: Diagnosis not present

## 2019-08-04 DIAGNOSIS — I83813 Varicose veins of bilateral lower extremities with pain: Secondary | ICD-10-CM | POA: Diagnosis not present

## 2019-08-04 DIAGNOSIS — I8311 Varicose veins of right lower extremity with inflammation: Secondary | ICD-10-CM

## 2019-08-04 DIAGNOSIS — F17200 Nicotine dependence, unspecified, uncomplicated: Secondary | ICD-10-CM | POA: Diagnosis not present

## 2019-08-09 ENCOUNTER — Encounter (INDEPENDENT_AMBULATORY_CARE_PROVIDER_SITE_OTHER): Payer: Self-pay | Admitting: Nurse Practitioner

## 2019-08-09 NOTE — Progress Notes (Signed)
SUBJECTIVE:  Patient ID: Andrea Ford, female    DOB: 08-20-1950, 68 y.o.   MRN: [id number] Chief Complaint  Patient presents with  . Follow-up    ultrasound follow up    HPI  Andrea Ford is a 68 y.o. female presents today for evaluation of her bilateral lower extremity varicose veins.  The patient previously had a bilateral lower extremity venous reflux study which revealed reflux in her bilateral lower extremities sometime in May.  However due to numerous circumstances the patient was unable to schedule a endovenous laser procedure.  Since that time the patient has continued to utilize conservative therapy such as compression stockings, elevation of her lower extremities and exercise.  She continues to utilize NSAIDs for lower extremity leg pain however she continues to have persistent pain.  She denies fever, chills, nausea, or diarrhea.  Today noninvasive studies show no evidence of DVT, superficial venous thrombosis or chronic venous insufficiency bilaterally.  There is evidence of Baker's cyst behind both knees bilaterally.  The Baker's cyst behind the right knee measures 2.1 cm x 1.16 cm.  Behind the left knee it measures 2.35 cm x 1.49 cm.  Past Medical History:  Diagnosis Date  . Asthma   . COPD (chronic obstructive pulmonary disease) ([place])   . Hypertension   . Migraine     Past Surgical History:  Procedure Laterality Date  . ANKLE SURGERY Right   . CARDIAC CATHETERIZATION Right 07/15/2016   Procedure: Left Heart Cath and Coronary Angiography;  Surgeon: Dionisio David, MD;  Location: [facility];  Service: Cardiovascular;  Laterality: Right;  . CHOLECYSTECTOMY    . TUBAL LIGATION      Social History   Socioeconomic History  . Marital status: Divorced    Spouse name: Not on file  . Number of children: Not on file  . Years of education: Not on file  . Highest education level: Not on file  Occupational History  . Not on file  Tobacco Use  . Smoking  status: Former Research scientist (life sciences)  . Smokeless tobacco: Never Used  Substance and Sexual Activity  . Alcohol use: No  . Drug use: No  . Sexual activity: Not on file  Other Topics Concern  . Not on file  Social History Narrative  . Not on file   Social Determinants of Health   Financial Resource Strain:   . Difficulty of Paying Living Expenses: Not on file  Food Insecurity:   . Worried About Charity fundraiser in the Last Year: Not on file  . Ran Out of Food in the Last Year: Not on file  Transportation Needs:   . Lack of Transportation (Medical): Not on file  . Lack of Transportation (Non-Medical): Not on file  Physical Activity:   . Days of Exercise per Week: Not on file  . Minutes of Exercise per Session: Not on file  Stress:   . Feeling of Stress : Not on file  Social Connections:   . Frequency of Communication with Friends and Family: Not on file  . Frequency of Social Gatherings with Friends and Family: Not on file  . Attends Religious Services: Not on file  . Active Member of Clubs or Organizations: Not on file  . Attends Archivist Meetings: Not on file  . Marital Status: Not on file  Intimate Partner Violence:   . Fear of Current or Ex-Partner: Not on file  . Emotionally Abused: Not on file  .  Physically Abused: Not on file  . Sexually Abused: Not on file    Family History  Problem Relation Age of Onset  . Hypertension Mother   . Migraines Mother   . Cancer Mother   . Varicose Veins Mother   . Heart disease Father   . Arthritis Father   . Hypertension Father   . Diabetes Maternal Uncle   . Stroke Maternal Grandmother   . Stroke Maternal Grandfather   . Stroke Paternal Grandmother   . Stroke Paternal Grandfather     Allergies  Allergen Reactions  . Prednisone Rash and Other (See Comments)    hyper   . Diphenhydramine Hcl Other (See Comments)  . Other Other (See Comments)    Pet dander  . Oxycodone-Acetaminophen   . Codeine Itching and Other (See  Comments)  . Diphenhydramine Anxiety  . Etodolac Rash, Nausea And Vomiting and Other (See Comments)  . Hydrocodone Itching  . Meperidine Rash and Other (See Comments)    Pt. Not aware  Of ever having any demerol   . Venlafaxine Rash and Other (See Comments)     Review of Systems   Review of Systems: Negative Unless Checked Constitutional: [] Weight loss  [] Fever  [] Chills Cardiac: [] Chest pain   []  Atrial Fibrillation  [] Palpitations   [] Shortness of breath when laying flat   [] Shortness of breath with exertion. [] Shortness of breath at rest Vascular:  [] Pain in legs with walking   [] Pain in legs with standing [] Pain in legs when laying flat   [] Claudication    [] Pain in feet when laying flat    [] History of DVT   [] Phlebitis   [x] Swelling in legs   [x] Varicose veins   [] Non-healing ulcers Pulmonary:   [] Uses home oxygen   [] Productive cough   [] Hemoptysis   [] Wheeze  [] COPD   [x] Asthma Neurologic:  [] Dizziness   [] Seizures  [] Blackouts [] History of stroke   [] History of TIA  [] Aphasia   [] Temporary Blindness   [] Weakness or numbness in arm   [] Weakness or numbness in leg Musculoskeletal:   [] Joint swelling   [] Joint pain   [] Low back pain  []  History of Knee Replacement [x] Arthritis [] back Surgeries  []  Spinal Stenosis    Hematologic:  [] Easy bruising  [] Easy bleeding   [] Hypercoagulable state   [] Anemic Gastrointestinal:  [] Diarrhea   [] Vomiting  [] Gastroesophageal reflux/heartburn   [] Difficulty swallowing. [] Abdominal pain Genitourinary:  [] Chronic kidney disease   [] Difficult urination  [] Anuric   [] Blood in urine [] Frequent urination  [] Burning with urination   [] Hematuria Skin:  [] Rashes   [] Ulcers [] Wounds Psychological:  [x] History of anxiety   []  History of major depression  []  Memory Difficulties      OBJECTIVE:   Physical Exam  BP 139/84 (BP Location: Left Arm)   Pulse 69   Resp 16   Gen: WD/WN, NAD Head: /AT, No temporalis wasting.  Ear/Nose/Throat: Hearing grossly  intact, nares w/o erythema or drainage Eyes: PER, EOMI, sclera nonicteric.  Neck: Supple, no masses.  No JVD.  Pulmonary:  Good air movement, no use of accessory muscles.  Cardiac: RRR Vascular:  Bilateral scattered varicosities Vessel Right Left  Radial Palpable Palpable   Gastrointestinal: soft, non-distended. No guarding/no peritoneal signs.  Musculoskeletal: M/S 5/5 throughout.  No deformity or atrophy.  Neurologic: Pain and light touch intact in extremities.  Symmetrical.  Speech is fluent. Motor exam as listed above. Psychiatric: Judgment intact, Mood & affect appropriate for pt's clinical situation. Dermatologic: No Venous rashes. No Ulcers  Noted.  No changes consistent with cellulitis. Lymph : No Cervical lymphadenopathy, no lichenification or skin changes of chronic lymphedema.       ASSESSMENT AND PLAN:  1. Varicose veins of both lower extremities with inflammation The patient has continued to do conservative therapy such as wearing medical grade 1 compression stockings, exercise and elevation of the lower extremities for her varicose veins.  Previous lower extremity reflux study revealed reflux in her bilateral lower extremities however today no evidence of reflux is found.  We will have the patient return to the office in 6 weeks or so in order to repeat studies.  Patient will continue conservative therapy until then.  2. Fibromyalgia This certainly can contribute to the lower extremity pain that the patient is having.  I discussed with the patient that even with lower extremity endovenous ablation and treatment of her varicose veins the fibromyalgia in addition to lower back issues may mean that she continues to have lower extremity pain.  3. Pain in both lower extremities The patient also endorses having some lower back issues which could also be contributing to her lower extremity pain.  This is in addition to the varicose veins as well as fibromyalgia listed above.  The  patient also has bilateral Baker's cyst and this too could be contributing to her discomfort in her lower extremities.  I have advised that she speak with her orthopedic doctor about possible treatment options.  4. Tobacco use disorder The patient recently began smoking again however she is endeavoring to quit.  We spent approximately 3 to 5 minutes discussing the need for smoking cessation, and continued risk of continued smoking.   Current Outpatient Medications on File Prior to Visit  Medication Sig Dispense Refill  . albuterol (PROVENTIL HFA;VENTOLIN HFA) 108 (90 BASE) MCG/ACT inhaler Inhale into the lungs every 6 (six) hours as needed for wheezing or shortness of breath.    [name] albuterol (PROVENTIL) (2.5 MG/3ML) 0.083% nebulizer solution Take 3 mLs (2.5 mg total) by nebulization every 6 (six) hours as needed for wheezing or shortness of breath. 75 mL 0  . amoxicillin (AMOXIL) 500 MG capsule TAKE 1 CAPSULE BY MOUTH 4 TIMES DAILY FOR 10 DAYS    . atorvastatin (LIPITOR) 40 MG tablet Take 1 tablet (40 mg total) by mouth daily at 6 PM. 30 tablet 2  . busPIRone (BUSPAR) 10 MG tablet     . carvedilol (COREG) 6.25 MG tablet Take 6.25 mg by mouth at bedtime.    . Cholecalciferol (VITAMIN D-3 PO) Take 1 tablet by mouth daily.    . citalopram (CELEXA) 20 MG tablet Take 20 mg by mouth daily.     . clonazePAM (KLONOPIN) 0.5 MG tablet Take 0.25 mg by mouth 2 (two) times daily as needed for anxiety.    . dicyclomine (BENTYL) 20 MG tablet Take 20 mg by mouth daily as needed for spasms.    [name] estradiol (ESTRACE) 0.1 MG/GM vaginal cream INSERT 2 GRAMS INTO THE VAGINA DAILY FOR 1 TO 2 WEEKS THEN GRADUALLY REDUCE TO HALF THE INITIAL DOSE FOR 1 TO 2 WEEKS FOLLOWED BY A MAINTENA    . famotidine (PEPCID) 40 MG tablet Take 1 tablet (40 mg total) by mouth at bedtime. 30 tablet 1  . furosemide (LASIX) 20 MG tablet Take 20 mg by mouth daily.    . hydrOXYzine (ATARAX/VISTARIL) 25 MG tablet Take 25 mg by mouth daily as  needed.    . rizatriptan (MAXALT-MLT) 10 MG disintegrating tablet Take  10 mg by mouth daily as needed.    . traZODone (DESYREL) 50 MG tablet Take 50 mg by mouth at bedtime as needed for sleep.    [name] triamcinolone cream (KENALOG) 0.1 % Apply topically as needed.     . doxycycline (VIBRA-TABS) 100 MG tablet Take 100 mg by mouth 2 (two) times daily.    [name] losartan (COZAAR) 100 MG tablet Take 100 mg by mouth daily.     No current facility-administered medications on file prior to visit.    There are no Patient Instructions on file for this visit. No follow-ups on file.   Kris Hartmann, NP  This note was completed with Sales executive.  Any errors are purely unintentional.

## 2019-08-22 ENCOUNTER — Encounter (INDEPENDENT_AMBULATORY_CARE_PROVIDER_SITE_OTHER): Payer: Self-pay | Admitting: Vascular Surgery

## 2019-08-22 NOTE — Progress Notes (Signed)
MRN : [id number]  Andrea Ford is a 69 y.o. (06-Apr-1951) female who presents with chief complaint of  Chief Complaint  Patient presents with  . Follow-up    discuss varicose veins/laser surgery  .  History of Present Illness:   The patient returns for followup evaluation more than 3 months after the initial visit. The patient continues to have pain in the lower extremities with dependency. The pain is lessened with elevation. Graduated compression stockings, Class I (20-30 mmHg), have been worn but the stockings do not eliminate the leg pain. Over-the-counter analgesics do not improve the symptoms. The degree of discomfort continues to interfere with daily activities. The patient notes the pain in the legs is causing problems with daily exercise, at the workplace and even with household activities and maintenance such as standing in the kitchen preparing meals and doing dishes.     Current Meds  Medication Sig  . albuterol (PROVENTIL HFA;VENTOLIN HFA) 108 (90 BASE) MCG/ACT inhaler Inhale into the lungs every 6 (six) hours as needed for wheezing or shortness of breath.  [name] albuterol (PROVENTIL) (2.5 MG/3ML) 0.083% nebulizer solution Take 3 mLs (2.5 mg total) by nebulization every 6 (six) hours as needed for wheezing or shortness of breath.  [name] atorvastatin (LIPITOR) 40 MG tablet Take 1 tablet (40 mg total) by mouth daily at 6 PM.  . busPIRone (BUSPAR) 10 MG tablet   . Cholecalciferol (VITAMIN D-3 PO) Take 1 tablet by mouth daily.  . citalopram (CELEXA) 20 MG tablet Take 20 mg by mouth daily.   . clonazePAM (KLONOPIN) 0.5 MG tablet Take 0.25 mg by mouth 2 (two) times daily as needed for anxiety.  . dicyclomine (BENTYL) 20 MG tablet Take 20 mg by mouth daily as needed for spasms.  . famotidine (PEPCID) 40 MG tablet Take 1 tablet (40 mg total) by mouth at bedtime.  . furosemide (LASIX) 20 MG tablet Take 20 mg by mouth daily.  [name] losartan (COZAAR) 100 MG tablet Take 100 mg by mouth daily.    . rizatriptan (MAXALT-MLT) 10 MG disintegrating tablet Take 10 mg by mouth daily as needed.  . traZODone (DESYREL) 50 MG tablet Take 50 mg by mouth at bedtime as needed for sleep.  [name] triamcinolone cream (KENALOG) 0.1 % Apply topically as needed.     Past Medical History:  Diagnosis Date  . Asthma   . COPD (chronic obstructive pulmonary disease) ([place])   . Hypertension   . Migraine     Past Surgical History:  Procedure Laterality Date  . ANKLE SURGERY Right   . CARDIAC CATHETERIZATION Right 07/15/2016   Procedure: Left Heart Cath and Coronary Angiography;  Surgeon: Dionisio David, MD;  Location: [facility];  Service: Cardiovascular;  Laterality: Right;  . CHOLECYSTECTOMY    . TUBAL LIGATION      Social History Social History   Tobacco Use  . Smoking status: Former Research scientist (life sciences)  . Smokeless tobacco: Never Used  Substance Use Topics  . Alcohol use: No  . Drug use: No    Family History Family History  Problem Relation Age of Onset  . Hypertension Mother   . Migraines Mother   . Cancer Mother   . Varicose Veins Mother   . Heart disease Father   . Arthritis Father   . Hypertension Father   . Diabetes Maternal Uncle   . Stroke Maternal Grandmother   . Stroke Maternal Grandfather   . Stroke Paternal Grandmother   . Stroke  Paternal Grandfather     Allergies  Allergen Reactions  . Prednisone Rash and Other (See Comments)    hyper   . Diphenhydramine Hcl Other (See Comments)  . Other Other (See Comments)    Pet dander  . Oxycodone-Acetaminophen   . Codeine Itching and Other (See Comments)  . Diphenhydramine Anxiety  . Etodolac Rash, Nausea And Vomiting and Other (See Comments)  . Hydrocodone Itching  . Meperidine Rash and Other (See Comments)    Pt. Not aware  Of ever having any demerol   . Venlafaxine Rash and Other (See Comments)     REVIEW OF SYSTEMS (Negative unless checked)  Constitutional: [] Weight loss  [] Fever  [] Chills Cardiac: [] Chest pain    [] Chest pressure   [] Palpitations   [] Shortness of breath when laying flat   [] Shortness of breath with exertion. Vascular:  [] Pain in legs with walking   [x] Pain in legs at rest  [] History of DVT   [] Phlebitis   [x] Swelling in legs   [x] Varicose veins   [] Non-healing ulcers Pulmonary:   [] Uses home oxygen   [] Productive cough   [] Hemoptysis   [] Wheeze  [] COPD   [] Asthma Neurologic:  [] Dizziness   [] Seizures   [] History of stroke   [] History of TIA  [] Aphasia   [] Vissual changes   [] Weakness or numbness in arm   [] Weakness or numbness in leg Musculoskeletal:   [] Joint swelling   [] Joint pain   [] Low back pain Hematologic:  [] Easy bruising  [] Easy bleeding   [] Hypercoagulable state   [] Anemic Gastrointestinal:  [] Diarrhea   [] Vomiting  [] Gastroesophageal reflux/heartburn   [] Difficulty swallowing. Genitourinary:  [] Chronic kidney disease   [] Difficult urination  [] Frequent urination   [] Blood in urine Skin:  [] Rashes   [] Ulcers  Psychological:  [] History of anxiety   []  History of major depression.  Physical Examination  Vitals:   07/26/19 1521  BP: (!) 177/103  Pulse: 88  Resp: 16  Weight: 141 lb (64 kg)   Body mass index is 26.21 kg/m. Gen: WD/WN, NAD Head: [place]/AT, No temporalis wasting.  Ear/Nose/Throat: Hearing grossly intact, nares w/o erythema or drainage Eyes: PER, EOMI, sclera nonicteric.  Neck: Supple, no large masses.   Pulmonary:  Good air movement, no audible wheezing bilaterally, no use of accessory muscles.  Cardiac: RRR, no JVD Vascular: Large varicosities present extensively greater than 6-8 mm bilaterally.  Mild venous stasis changes to the legs bilaterally.  2+ soft pitting edema Vessel Right Left  PT Palpable Palpable  DP Palpable Palpable  Gastrointestinal: Non-distended. No guarding/no peritoneal signs.  Musculoskeletal: M/S 5/5 throughout.  No deformity or atrophy.  Neurologic: CN 2-12 intact. Symmetrical.  Speech is fluent. Motor exam as listed  above. Psychiatric: Judgment intact, Mood & affect appropriate for pt's clinical situation. Dermatologic: venous rashes no ulcers noted.  No changes consistent with cellulitis. Lymph : No lichenification or skin changes of chronic lymphedema.  CBC Lab Results  Component Value Date   WBC 6.2 02/28/2019   HGB 14.7 02/28/2019   HCT 43.9 02/28/2019   MCV 87.1 02/28/2019   PLT 274 02/28/2019    BMET    Component Value Date/Time   NA 138 02/28/2019 1917   NA 141 11/02/2014 0937   K 3.5 02/28/2019 1917   K 4.0 11/02/2014 0937   CL 105 02/28/2019 1917   CL 107 11/02/2014 0937   CO2 24 02/28/2019 1917   CO2 26 11/02/2014 0937   GLUCOSE 98 02/28/2019 1917   GLUCOSE 124 (H) 11/02/2014  0937   BUN 14 02/28/2019 1917   BUN 16 11/02/2014 0937   CREATININE 0.70 02/28/2019 1917   CREATININE 0.70 11/02/2014 0937   CALCIUM 9.3 02/28/2019 1917   CALCIUM 9.2 11/02/2014 0937   GFRNONAA >60 02/28/2019 1917   GFRNONAA >60 11/02/2014 0937   GFRAA >60 02/28/2019 1917   GFRAA >60 11/02/2014 0937   CrCl cannot be calculated (Patient's most recent lab result is older than the maximum 21 days allowed.).  COAG Lab Results  Component Value Date   INR 0.91 08/12/2018   INR 0.94 07/13/2016    Radiology VAS [place] LOWER EXTREMITY VENOUS REFLUX  Result Date: 08/16/2019  Lower Venous Reflux Study Indications: Pain, and varicosities.  Performing Technologist: Almira Coaster RVS  Examination Guidelines: A complete evaluation includes B-mode imaging, spectral Doppler, color Doppler, and power Doppler as needed of all accessible portions of each vessel. Bilateral testing is considered an integral part of a complete examination. Limited examinations for reoccurring indications may be performed as noted. The reflux portion of the exam is performed with the patient in reverse Trendelenburg.  +---------+---------------+---------+-----------+----------+--------------+ RIGHT     CompressibilityPhasicitySpontaneityPropertiesThrombus Aging +---------+---------------+---------+-----------+----------+--------------+ CFV      Full           Yes      Yes                                 +---------+---------------+---------+-----------+----------+--------------+ SFJ      Full           Yes      Yes                                 +---------+---------------+---------+-----------+----------+--------------+ FV Prox  Full           Yes      Yes                                 +---------+---------------+---------+-----------+----------+--------------+ FV Mid   Full           Yes      Yes                                 +---------+---------------+---------+-----------+----------+--------------+ FV DistalFull           Yes      Yes                                 +---------+---------------+---------+-----------+----------+--------------+ PFV      Full           Yes      Yes                                 +---------+---------------+---------+-----------+----------+--------------+ POP      Full           Yes      Yes                                 +---------+---------------+---------+-----------+----------+--------------+ GSV      Full  Yes      Yes                                 +---------+---------------+---------+-----------+----------+--------------+ SSV      Full           Yes      Yes                                 +---------+---------------+---------+-----------+----------+--------------+   +---------+---------------+---------+-----------+----------+--------------+ LEFT     CompressibilityPhasicitySpontaneityPropertiesThrombus Aging +---------+---------------+---------+-----------+----------+--------------+ CFV      Full           Yes      Yes                                 +---------+---------------+---------+-----------+----------+--------------+ SFJ      Full           Yes      Yes                                  +---------+---------------+---------+-----------+----------+--------------+ FV Prox  Full           Yes      Yes                                 +---------+---------------+---------+-----------+----------+--------------+ FV Mid   Full           Yes      Yes                                 +---------+---------------+---------+-----------+----------+--------------+ FV DistalFull           Yes      Yes                                 +---------+---------------+---------+-----------+----------+--------------+ PFV      Full           Yes      Yes                                 +---------+---------------+---------+-----------+----------+--------------+ POP      Full           Yes      Yes                                 +---------+---------------+---------+-----------+----------+--------------+ GSV      Full           Yes      Yes                                 +---------+---------------+---------+-----------+----------+--------------+ SSV      Full           Yes      Yes                                 +---------+---------------+---------+-----------+----------+--------------+  Venous Reflux Times +--------------+------+-----------+------------+--------+ RIGHT         RefluxReflux TimeDiameter cmsComments                Yes                                  +--------------+------+-----------+------------+--------+ GSV at SFJ                         .55              +--------------+------+-----------+------------+--------+ GSV prox thigh                     .48              +--------------+------+-----------+------------+--------+ GSV mid thigh                      .23              +--------------+------+-----------+------------+--------+ GSV dist thigh                     .35              +--------------+------+-----------+------------+--------+ GSV at knee                        .33               +--------------+------+-----------+------------+--------+ GSV prox calf                      .41              +--------------+------+-----------+------------+--------+ SSV Pop Fossa                      .26              +--------------+------+-----------+------------+--------+  +--------------+------+-----------+------------+--------+ LEFT          RefluxReflux TimeDiameter cmsComments                Yes                                  +--------------+------+-----------+------------+--------+ GSV at SFJ                         .60              +--------------+------+-----------+------------+--------+ GSV prox thigh                     .50              +--------------+------+-----------+------------+--------+ GSV mid thigh                      .33              +--------------+------+-----------+------------+--------+ GSV dist thigh                     .29              +--------------+------+-----------+------------+--------+ GSV at knee                        .30              +--------------+------+-----------+------------+--------+  GSV prox calf                      .37              +--------------+------+-----------+------------+--------+ SSV Pop Fossa                      .13              +--------------+------+-----------+------------+--------+   Summary: Right: There is no evidence of deep vein thrombosis in the lower extremity.There is no evidence of chronic venous insufficiency.There is no evidence of superficial venous thrombosis. Baker's Cyst seen behind the Right Knee area measuring 2.10cms x1.16cms. Left: There is no evidence of deep vein thrombosis in the lower extremity. There is no evidence of chronic venous insufficiency.There is no evidence of superficial venous thrombosis. Baker's Cyst behind the Left Knee area measuring 2.35cms x 1.49cms.  *See table(s) above for measurements and observations. Electronically signed by Hortencia Pilar  MD on 08/16/2019 at 6:06:29 PM.    Final       Assessment/Plan 1. Varicose veins of both lower extremities with inflammation  Recommend:  The patient has large symptomatic varicose veins that are painful and associated with swelling.  I have had a long discussion with the patient regarding  varicose veins and why they cause symptoms.  Patient will begin wearing graduated compression stockings class 1 on a daily basis, beginning first thing in the morning and removing them in the evening. The patient is instructed specifically not to sleep in the stockings.    The patient  will also begin using over-the-counter analgesics such as Motrin 600 mg po TID to help control the symptoms.    In addition, behavioral modification including elevation during the day will be initiated.    Pending the results of these changes the  patient will be reevaluated in three months.   An  ultrasound of the venous system will be obtained.   Further plans will be based on the ultrasound results and whether conservative therapies are successful at eliminating the pain and swelling.  - VENOUS REFLUX; Future  2. Pain in both lower extremities  Recommend:  The patient has large symptomatic varicose veins that are painful and associated with swelling.  I have had a long discussion with the patient regarding  varicose veins and why they cause symptoms.  Patient will begin wearing graduated compression stockings class 1 on a daily basis, beginning first thing in the morning and removing them in the evening. The patient is instructed specifically not to sleep in the stockings.    The patient  will also begin using over-the-counter analgesics such as Motrin 600 mg po TID to help control the symptoms.    In addition, behavioral modification including elevation during the day will be initiated.    Pending the results of these changes the  patient will be reevaluated in three months.   An  ultrasound of the venous system  will be obtained.   Further plans will be based on the ultrasound results and whether conservative therapies are successful at eliminating the pain and swelling.   3. Essential hypertension Continue antihypertensive medications as already ordered, these medications have been reviewed and there are no changes at this time.   4. Asthma, unspecified asthma severity, unspecified whether complicated, unspecified whether persistent Continue pulmonary medications and aerosols as already ordered, these medications have been reviewed and there are no changes at this time.  Hortencia Pilar, MD  08/22/2019 12:01 PM

## 2019-09-23 ENCOUNTER — Ambulatory Visit (INDEPENDENT_AMBULATORY_CARE_PROVIDER_SITE_OTHER): Payer: Medicare Other | Admitting: Vascular Surgery

## 2019-09-23 ENCOUNTER — Encounter (INDEPENDENT_AMBULATORY_CARE_PROVIDER_SITE_OTHER): Payer: Medicare Other

## 2019-09-27 ENCOUNTER — Encounter (INDEPENDENT_AMBULATORY_CARE_PROVIDER_SITE_OTHER): Payer: Self-pay | Admitting: Vascular Surgery

## 2019-09-27 ENCOUNTER — Ambulatory Visit (INDEPENDENT_AMBULATORY_CARE_PROVIDER_SITE_OTHER): Payer: Medicare Other | Admitting: Vascular Surgery

## 2019-09-27 ENCOUNTER — Ambulatory Visit (INDEPENDENT_AMBULATORY_CARE_PROVIDER_SITE_OTHER): Payer: Medicare Other

## 2019-09-27 ENCOUNTER — Other Ambulatory Visit: Payer: Self-pay

## 2019-09-27 VITALS — BP 160/98 | HR 84 | Resp 12 | Ht 61.0 in | Wt 142.0 lb

## 2019-09-27 DIAGNOSIS — M79604 Pain in right leg: Secondary | ICD-10-CM | POA: Diagnosis not present

## 2019-09-27 DIAGNOSIS — I10 Essential (primary) hypertension: Secondary | ICD-10-CM

## 2019-09-27 DIAGNOSIS — I8311 Varicose veins of right lower extremity with inflammation: Secondary | ICD-10-CM

## 2019-09-27 DIAGNOSIS — K219 Gastro-esophageal reflux disease without esophagitis: Secondary | ICD-10-CM

## 2019-09-27 DIAGNOSIS — J45909 Unspecified asthma, uncomplicated: Secondary | ICD-10-CM

## 2019-09-27 DIAGNOSIS — I8312 Varicose veins of left lower extremity with inflammation: Secondary | ICD-10-CM

## 2019-09-27 DIAGNOSIS — M79605 Pain in left leg: Secondary | ICD-10-CM

## 2019-09-27 NOTE — Progress Notes (Signed)
MRN : [id number]  Andrea Ford is a 69 y.o. (01/21/1951) female who presents with chief complaint of No chief complaint on file. [name]  History of Present Illness:  The patient returns for followup evaluation 3 months after the initial visit. The patient continues to have pain in the lower extremities with dependency. The pain is lessened with elevation. Graduated compression stockings, Class I (20-30 mmHg), have been worn but the stockings do not eliminate the leg pain. Over-the-counter analgesics do not improve the symptoms. The degree of discomfort continues to interfere with daily activities. The patient notes the pain in the legs is causing problems with daily exercise, at the workplace and even with household activities and maintenance such as standing in the kitchen preparing meals and doing dishes.   Venous ultrasound shows normal deep venous system, no evidence of acute or chronic DVT.  Superficial reflux is not present in the great saphenous veins bilaterally.  This is consistent with the venous duplex from   No outpatient medications have been marked as taking for the 09/27/19 encounter (Appointment) with Delana Meyer, Dolores Lory, MD.    Past Medical History:  Diagnosis Date  . Asthma   . COPD (chronic obstructive pulmonary disease) ([place])   . Hypertension   . Migraine     Past Surgical History:  Procedure Laterality Date  . ANKLE SURGERY Right   . CARDIAC CATHETERIZATION Right 07/15/2016   Procedure: Left Heart Cath and Coronary Angiography;  Surgeon: Dionisio David, MD;  Location: [facility];  Service: Cardiovascular;  Laterality: Right;  . CHOLECYSTECTOMY    . TUBAL LIGATION      Social History Social History   Tobacco Use  . Smoking status: Former Research scientist (life sciences)  . Smokeless tobacco: Never Used  Substance Use Topics  . Alcohol use: No  . Drug use: No    Family History Family History  Problem Relation Age of Onset  . Hypertension Mother   . Migraines Mother    . Cancer Mother   . Varicose Veins Mother   . Heart disease Father   . Arthritis Father   . Hypertension Father   . Diabetes Maternal Uncle   . Stroke Maternal Grandmother   . Stroke Maternal Grandfather   . Stroke Paternal Grandmother   . Stroke Paternal Grandfather     Allergies  Allergen Reactions  . Prednisone Rash and Other (See Comments)    hyper   . Diphenhydramine Hcl Other (See Comments)  . Other Other (See Comments)    Pet dander  . Oxycodone-Acetaminophen   . Codeine Itching and Other (See Comments)  . Diphenhydramine Anxiety  . Etodolac Rash, Nausea And Vomiting and Other (See Comments)  . Hydrocodone Itching  . Meperidine Rash and Other (See Comments)    Pt. Not aware  Of ever having any demerol   . Venlafaxine Rash and Other (See Comments)     REVIEW OF SYSTEMS (Negative unless checked)  Constitutional: [] Weight loss  [] Fever  [] Chills Cardiac: [] Chest pain   [] Chest pressure   [] Palpitations   [] Shortness of breath when laying flat   [] Shortness of breath with exertion. Vascular:  [] Pain in legs with walking   [] Pain in legs at rest  [] History of DVT   [] Phlebitis   [] Swelling in legs   [] Varicose veins   [] Non-healing ulcers Pulmonary:   [] Uses home oxygen   [] Productive cough   [] Hemoptysis   [] Wheeze  [] COPD   [] Asthma Neurologic:  [] Dizziness   []   Seizures   [] History of stroke   [] History of TIA  [] Aphasia   [] Vissual changes   [] Weakness or numbness in arm   [] Weakness or numbness in leg Musculoskeletal:   [] Joint swelling   [] Joint pain   [] Low back pain Hematologic:  [] Easy bruising  [] Easy bleeding   [] Hypercoagulable state   [] Anemic Gastrointestinal:  [] Diarrhea   [] Vomiting  [] Gastroesophageal reflux/heartburn   [] Difficulty swallowing. Genitourinary:  [] Chronic kidney disease   [] Difficult urination  [] Frequent urination   [] Blood in urine Skin:  [] Rashes   [] Ulcers  Psychological:  [] History of anxiety   []  History of major depression.   Physical Examination  There were no vitals filed for this visit. There is no height or weight on file to calculate BMI. Gen: WD/WN, NAD Head: [place]/AT, No temporalis wasting.  Ear/Nose/Throat: Hearing grossly intact, nares w/o erythema or drainage Eyes: PER, EOMI, sclera nonicteric.  Neck: Supple, no large masses.   Pulmonary:  Good air movement, no audible wheezing bilaterally, no use of accessory muscles.  Cardiac: RRR, no JVD Vascular: lower extremities symetric Gastrointestinal: Non-distended. No guarding/no peritoneal signs.  Musculoskeletal: M/S 5/5 throughout.  No deformity.  Neurologic: CN 2-12 intact. Symmetrical.  Speech is fluent. Motor exam as listed above. Psychiatric: Judgment intact, Mood & affect appropriate for pt's clinical situation. Dermatologic: No rashes or ulcers noted.  No changes consistent with cellulitis.  CBC Lab Results  Component Value Date   WBC 6.2 02/28/2019   HGB 14.7 02/28/2019   HCT 43.9 02/28/2019   MCV 87.1 02/28/2019   PLT 274 02/28/2019    BMET    Component Value Date/Time   NA 138 02/28/2019 1917   NA 141 11/02/2014 0937   K 3.5 02/28/2019 1917   K 4.0 11/02/2014 0937   CL 105 02/28/2019 1917   CL 107 11/02/2014 0937   CO2 24 02/28/2019 1917   CO2 26 11/02/2014 0937   GLUCOSE 98 02/28/2019 1917   GLUCOSE 124 (H) 11/02/2014 0937   BUN 14 02/28/2019 1917   BUN 16 11/02/2014 0937   CREATININE 0.70 02/28/2019 1917   CREATININE 0.70 11/02/2014 0937   CALCIUM 9.3 02/28/2019 1917   CALCIUM 9.2 11/02/2014 0937   GFRNONAA >60 02/28/2019 1917   GFRNONAA >60 11/02/2014 0937   GFRAA >60 02/28/2019 1917   GFRAA >60 11/02/2014 0937   CrCl cannot be calculated (Patient's most recent lab result is older than the maximum 21 days allowed.).  COAG Lab Results  Component Value Date   INR 0.91 08/12/2018   INR 0.94 07/13/2016    Radiology No results found.   Assessment/Plan 1. Varicose veins of both lower extremities with  inflammation Recommend:  The patient does not need ablation of her saphenous venous system but still has persistent symptoms of pain and swelling that are having a negative impact on daily life and daily activities.  Patient should undergo injection sclerotherapy to treat the residual varicosities.  The risks, benefits and alternative therapies were reviewed in detail with the patient.  All questions were answered.  The patient agrees to proceed with sclerotherapy at their convenience.  The patient will continue wearing the graduated compression stockings and using the over-the-counter pain medications to treat her symptoms.   2. Pain in both lower extremities It is possible that her varicosities in the popliteal area are causing significant pain.  However, I will discussed in detail with her that her description of her leg symptoms seems out of proportion to the pain typically  associated with varicosities.  3. GERD without esophagitis Continue PPI as already ordered, this medication has been reviewed and there are no changes at this time.  Avoidence of caffeine and alcohol  Moderate elevation of the head of the bed   4. Asthma, unspecified asthma severity, unspecified whether complicated, unspecified whether persistent Continue pulmonary medications and aerosols as already ordered, these medications have been reviewed and there are no changes at this time.    5. Essential hypertension Continue antihypertensive medications as already ordered, these medications have been reviewed and there are no changes at this time.    Hortencia Pilar, MD  09/27/2019 1:34 PM

## 2019-09-28 ENCOUNTER — Encounter (INDEPENDENT_AMBULATORY_CARE_PROVIDER_SITE_OTHER): Payer: Self-pay | Admitting: Vascular Surgery

## 2019-11-10 ENCOUNTER — Ambulatory Visit (INDEPENDENT_AMBULATORY_CARE_PROVIDER_SITE_OTHER): Payer: Medicare Other | Admitting: Vascular Surgery

## 2019-11-17 ENCOUNTER — Ambulatory Visit (INDEPENDENT_AMBULATORY_CARE_PROVIDER_SITE_OTHER): Payer: Medicare Other | Admitting: Vascular Surgery

## 2019-12-08 ENCOUNTER — Ambulatory Visit (INDEPENDENT_AMBULATORY_CARE_PROVIDER_SITE_OTHER): Payer: Medicare Other | Admitting: Vascular Surgery

## 2019-12-08 ENCOUNTER — Encounter (INDEPENDENT_AMBULATORY_CARE_PROVIDER_SITE_OTHER): Payer: Self-pay | Admitting: Vascular Surgery

## 2019-12-08 ENCOUNTER — Other Ambulatory Visit: Payer: Self-pay

## 2019-12-08 VITALS — BP 164/96 | HR 84 | Resp 16 | Wt 138.0 lb

## 2019-12-08 DIAGNOSIS — I8312 Varicose veins of left lower extremity with inflammation: Secondary | ICD-10-CM

## 2019-12-08 DIAGNOSIS — I8311 Varicose veins of right lower extremity with inflammation: Secondary | ICD-10-CM | POA: Diagnosis not present

## 2019-12-08 NOTE — Progress Notes (Signed)
Varicose veins of bilateral  lower extremity with inflammation (454.1  I83.10) Current Plans   Indication: Patient presents with symptomatic varicose veins of the bilateral  lower extremity.   Procedure: Sclerotherapy using hypertonic saline mixed with 1% Lidocaine was performed on the bilateral lower extremity. Compression wraps were placed. The patient tolerated the procedure well. 

## 2020-01-30 ENCOUNTER — Emergency Department
Admission: EM | Admit: 2020-01-30 | Discharge: 2020-01-30 | Disposition: A | Discharge status: Home / Self Care | Payer: Medicare Other | Attending: Emergency Medicine | Admitting: Emergency Medicine

## 2020-01-30 ENCOUNTER — Other Ambulatory Visit: Payer: Self-pay

## 2020-01-30 ENCOUNTER — Encounter: Payer: Self-pay | Admitting: Emergency Medicine

## 2020-01-30 ENCOUNTER — Emergency Department: Payer: Medicare Other

## 2020-01-30 DIAGNOSIS — J449 Chronic obstructive pulmonary disease, unspecified: Secondary | ICD-10-CM | POA: Insufficient documentation

## 2020-01-30 DIAGNOSIS — Z79899 Other long term (current) drug therapy: Secondary | ICD-10-CM | POA: Insufficient documentation

## 2020-01-30 DIAGNOSIS — Z87891 Personal history of nicotine dependence: Secondary | ICD-10-CM | POA: Insufficient documentation

## 2020-01-30 DIAGNOSIS — B349 Viral infection, unspecified: Secondary | ICD-10-CM | POA: Insufficient documentation

## 2020-01-30 DIAGNOSIS — R0789 Other chest pain: Secondary | ICD-10-CM | POA: Diagnosis present

## 2020-01-30 DIAGNOSIS — I10 Essential (primary) hypertension: Secondary | ICD-10-CM | POA: Diagnosis not present

## 2020-01-30 DIAGNOSIS — Z20822 Contact with and (suspected) exposure to covid-19: Secondary | ICD-10-CM | POA: Insufficient documentation

## 2020-01-30 LAB — BASIC METABOLIC PANEL
Anion gap: 10 (ref 5–15)
BUN: 20 mg/dL (ref 8–23)
CO2: 27 mmol/L (ref 22–32)
Calcium: 9.1 mg/dL (ref 8.9–10.3)
Chloride: 103 mmol/L (ref 98–111)
Creatinine, Ser: 0.76 mg/dL (ref 0.44–1.00)
GFR calc Af Amer: >60 mL/min (ref >60)
GFR calc non Af Amer: >60 mL/min (ref >60)
Glucose, Bld: 106 mg/dL — ABNORMAL HIGH (ref 70–99)
Potassium: 3.7 mmol/L (ref 3.5–5.1)
Sodium: 140 mmol/L (ref 135–145)

## 2020-01-30 LAB — CBC
HCT: 42.9 % (ref 36.0–46.0)
Hemoglobin: 14.8 g/dL (ref 12.0–15.0)
MCH: 29.8 pg (ref 26.0–34.0)
MCHC: 34.5 g/dL (ref 30.0–36.0)
MCV: 86.3 fL (ref 80.0–100.0)
Platelets: 292 10*3/uL (ref 150–400)
RBC: 4.97 MIL/uL (ref 3.87–5.11)
RDW: 11.6 % (ref 11.5–15.5)
WBC: 7.4 10*3/uL (ref 4.0–10.5)
nRBC: 0 % (ref 0.0–0.2)

## 2020-01-30 LAB — SARS CORONAVIRUS 2 BY RT PCR (HOSPITAL ORDER, PERFORMED IN ~~LOC~~ HOSPITAL LAB): SARS Coronavirus 2: NEGATIVE

## 2020-01-30 LAB — TROPONIN I (HIGH SENSITIVITY): Troponin I (High Sensitivity): 5 ng/L (ref <18)

## 2020-01-30 MED ORDER — SODIUM CHLORIDE 0.9% FLUSH: 3.0000 mL | Freq: Once | INTRAVENOUS | Status: DC

## 2020-01-30 NOTE — ED Triage Notes (Signed)
Pt presents to ED via POV with c/o HTN, reports at home BP 200/106, pt states took BP meds approx 45 mins PTA. Pt also c/o substernal CP that started today. Pt states "I don't feel well". Pt also c/o HA at this time.  Pt reports multiple external stressors at home this morning and that's why she didn't take her BP meds earlier today.

## 2020-01-30 NOTE — ED Provider Notes (Signed)
[facility] Emergency Department Provider Note   ____________________________________________    I have reviewed the triage vital signs and the nursing notes.   HISTORY  Chief Complaint Chest Pain and Hypertension     HPI Andrea Ford is a 69 y.o. female with a history of asthma, COPD, hypertension who presents with complaints of elevated blood pressure.  She also reports some very mild chest discomfort.  In addition she feels like she may be coming down with a cold and she is concerned that she may have coronavirus.  She has not been vaccinated.  She denies shortness of breath or cough.  No pleurisy.  Is mainly concerned about her blood pressure which she checked today after having a stressful day and found it to be elevated above 924 systolic.  She does report that she took her losartan and amlodipine this morning.  She does have a history of high blood pressure.  She felt some mild chest discomfort after realizing that her blood pressure was remaining elevated above 268 systolic.  Past Medical History:  Diagnosis Date  . Asthma   . COPD (chronic obstructive pulmonary disease) ([place])   . Hypertension   . Migraine     Patient Active Problem List   Diagnosis Date Noted  . Fibromyalgia 07/12/2019  . Age-related osteoporosis without current pathological fracture 06/29/2019  . Irritable bowel syndrome with both constipation and diarrhea 03/16/2019  . Small intestinal bacterial overgrowth 03/16/2019  . Leg pain 12/21/2018  . Varicose veins of both lower extremities with inflammation 11/30/2018  . Microphthalmos, 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  . Senile nuclear sclerosis, right 09/04/2018  . Enrolled in chronic care management 07/14/2018  . Nocturia 01/16/2018  . Uterine leiomyoma 01/16/2018  . Mixed stress and urge urinary incontinence 12/09/2017  . Dysfunction of both eustachian tubes 09/12/2017  . Severe episode of recurrent major depressive disorder,  without psychotic features ([place]) 08/13/2016  . Accelerated hypertension 07/13/2016  . Chest pain 07/13/2016  . Abnormal EKG 07/13/2016  . GAD (generalized anxiety disorder) 07/13/2016  . Migraine 03/22/2016  . Obstructive airway disease ([place]) 11/02/2015  . Chronic cough 05/03/2015  . Former cigarette smoker 05/03/2015  . Thickened endometrium 05/03/2015  . Essential hypertension 02/06/2015  . Anxiety with obsessional features 12/28/2012  . Eczema 12/28/2012  . Allergic rhinitis 03/30/2012  . Asthma 12/17/2011  . GERD without esophagitis 01/26/2003    Past Surgical History:  Procedure Laterality Date  . ANKLE SURGERY Right   . CARDIAC CATHETERIZATION Right 07/15/2016   Procedure: Left Heart Cath and Coronary Angiography;  Surgeon: Dionisio David, MD;  Location: [place] [facility];  Service: Cardiovascular;  Laterality: Right;  . CHOLECYSTECTOMY    . TUBAL LIGATION      Prior to Admission medications   Medication Sig Start Date End Date Taking? Authorizing Provider  albuterol (PROVENTIL HFA;VENTOLIN HFA) 108 (90 BASE) MCG/ACT inhaler Inhale into the lungs every 6 (six) hours as needed for wheezing or shortness of breath.    [provider]  albuterol (PROVENTIL) (2.5 MG/3ML) 0.083% nebulizer solution Take 3 mLs (2.5 mg total) by nebulization every 6 (six) hours as needed for wheezing or shortness of breath. 06/25/18   Carrie Mew, MD  amoxicillin (AMOXIL) 500 MG capsule TAKE 1 CAPSULE BY MOUTH 4 TIMES DAILY FOR 10 DAYS 02/19/19   [provider]  atorvastatin (LIPITOR) 40 MG tablet Take 1 tablet (40 mg total) by mouth daily at 6 PM. 07/15/16   Demetrios Loll,  MD  busPIRone (BUSPAR) 10 MG tablet  02/28/19   [provider]  carvedilol (COREG) 6.25 MG tablet Take 6.25 mg by mouth at bedtime.    [provider]  Cholecalciferol (VITAMIN D-3 PO) Take 1 tablet by mouth daily.    [provider]  citalopram (CELEXA) 20 MG tablet Take 20 mg by  mouth daily.  05/02/16   [provider]  clonazePAM (KLONOPIN) 0.5 MG tablet Take 0.25 mg by mouth 2 (two) times daily as needed for anxiety.    [provider]  dicyclomine (BENTYL) 20 MG tablet Take 20 mg by mouth daily as needed for spasms.    [provider]  doxycycline (VIBRA-TABS) 100 MG tablet Take 100 mg by mouth 2 (two) times daily.    [provider]  estradiol (ESTRACE) 0.1 MG/GM vaginal cream INSERT 2 GRAMS INTO THE VAGINA DAILY FOR 1 TO 2 WEEKS THEN GRADUALLY REDUCE TO HALF THE INITIAL DOSE FOR 1 TO 2 WEEKS FOLLOWED BY A MAINTENA 08/02/19   [provider]  famotidine (PEPCID) 40 MG tablet Take 1 tablet (40 mg total) by mouth at bedtime. 03/01/19 02/29/20  Rudene Re, MD  furosemide (LASIX) 20 MG tablet Take 20 mg by mouth daily. 02/17/19   [provider]  hydrOXYzine (ATARAX/VISTARIL) 25 MG tablet Take 25 mg by mouth daily as needed. 05/02/16   [provider]  losartan (COZAAR) 100 MG tablet Take 100 mg by mouth daily. 02/01/16 07/26/19  [provider]  losartan (COZAAR) 50 MG tablet Take 50 mg by mouth daily. 08/11/19   [provider]  Multiple Vitamin (MULTIVITAMIN) capsule Take by mouth.    [provider]  rizatriptan (MAXALT-MLT) 10 MG disintegrating tablet Take 10 mg by mouth daily as needed. 12/04/17   [provider]  sodium chloride 0.9 % nebulizer solution Inhale into the lungs. 09/21/19 09/20/20  [provider]  sodium chloride 0.9 % nebulizer solution SMARTSIG:1 Vial(s) Via Inhaler Every 6 Hours PRN 09/22/19   [provider]  traZODone (DESYREL) 50 MG tablet Take 50 mg by mouth at bedtime as needed for sleep.    [provider]  triamcinolone cream (KENALOG) 0.1 % Apply topically as needed.  05/17/19 05/16/20  [provider]     Allergies Prednisone, Diphenhydramine hcl, Other, Oxycodone-acetaminophen, Codeine, Diphenhydramine, Etodolac,  Hydrocodone, Meperidine, and Venlafaxine  Family History  Problem Relation Age of Onset  . Hypertension Mother   . Migraines Mother   . Cancer Mother   . Varicose Veins Mother   . Heart disease Father   . Arthritis Father   . Hypertension Father   . Diabetes Maternal Uncle   . Stroke Maternal Grandmother   . Stroke Maternal Grandfather   . Stroke Paternal Grandmother   . Stroke Paternal Grandfather     Social History Social History   Tobacco Use  . Smoking status: Former Research scientist (life sciences)  . Smokeless tobacco: Never Used  Vaping Use  . Vaping Use: Never used  Substance Use Topics  . Alcohol use: No  . Drug use: No    Review of Systems  Constitutional: No reports of fever Eyes: No visual changes.  ENT: No sore throat. Cardiovascular: As above Respiratory: Denies shortness of breath. Gastrointestinal: No abdominal pain.  No nausea, no vomiting.   Genitourinary: Negative for dysuria. Musculoskeletal: Negative for back pain. Skin: Negative for rash. Neurological: Negative for headaches    ____________________________________________   PHYSICAL EXAM:  VITAL SIGNS: ED Triage Vitals  Enc Vitals Group     BP 01/30/20 1349 (!) 181/103     Pulse Rate 01/30/20 1349 99     Resp 01/30/20 1349 20     Temp 01/30/20 1349 98.4 F (36.9 C)     Temp Source 01/30/20 1349 Oral     SpO2 01/30/20 1349 99 %     Weight 01/30/20 1659 64 kg (141 lb)     Height 01/30/20 1659 1.575 m (5\' 2" )     Head Circumference --      Peak Flow --      Pain Score 01/30/20 1349 8     Pain Loc --      Pain Edu? --      Excl. in [place]? --     Constitutional: Alert and oriented.   Nose: No congestion/rhinnorhea. Mouth/Throat: Mucous membranes are moist.    Cardiovascular: Normal rate, regular rhythm.   Good peripheral circulation. Respiratory: Normal respiratory effort.  No retractions.  Gastrointestinal: Soft and nontender. No distention.  No CVA tenderness.  Musculoskeletal:  Warm and well  perfused Neurologic:  Normal speech and language. No gross focal neurologic deficits are appreciated.  Skin:  Skin is warm, dry and intact. No rash noted. Psychiatric: Mood and affect are normal. Speech and behavior are normal.  ____________________________________________   LABS (all labs ordered are listed, but only abnormal results are displayed)  Labs Reviewed  BASIC METABOLIC PANEL - Abnormal; Notable for the following components:      Result Value   Glucose, Bld 106 (*)    All other components within normal limits  SARS CORONAVIRUS 2 BY RT PCR (HOSPITAL ORDER, [facility])  CBC  TROPONIN I (HIGH SENSITIVITY)   ____________________________________________  EKG  ED ECG REPORT I, Lavonia Drafts, the attending physician, personally viewed and interpreted this ECG.  Date: 01/30/2020  Rhythm: normal sinus rhythm QRS Axis: normal Intervals: normal ST/T Wave abnormalities: normal Narrative Interpretation: no evidence of acute ischemia  ____________________________________________  RADIOLOGY  Chest x-ray reviewed by me, reassuring ____________________________________________   PROCEDURES  Procedure(s) performed: No  Procedures   Critical Care performed: No ____________________________________________   INITIAL IMPRESSION / ASSESSMENT AND PLAN / ED COURSE  Pertinent labs & imaging results that were available during my care of the patient were reviewed by me and considered in my medical decision making (see chart for details).  Patient presents with primary concern of high blood pressure.    She did complain of some chest discomfort but I suspect this is anxiety related as is her high blood pressure as she describes quite stressful interactions with her children today.  Presentation is not consistent with ACS, EKG reassuring.  Blood pressure has improved without intervention since she checked in earlier.  EKG is quite reassuring, chest  x-ray unremarkable, troponin reassuring.  CBC, BMP unremarkable.  She is concerned that she may have Covid we will send Covid swab.  Discussed blood pressure medication adjustment with her, she will follow-up with PCP.      ____________________________________________   FINAL CLINICAL IMPRESSION(S) / ED DIAGNOSES  Final diagnoses:  Essential hypertension  Viral syndrome        Note:  This document was prepared using Dragon voice recognition software and may include unintentional dictation errors.   Lavonia Drafts, MD 01/30/20 [phone]

## 2020-02-03 ENCOUNTER — Telehealth (INDEPENDENT_AMBULATORY_CARE_PROVIDER_SITE_OTHER): Payer: Self-pay | Admitting: Vascular Surgery

## 2020-02-03 NOTE — Telephone Encounter (Signed)
Called stating both legs are swollen above the ankles, and she is having pain as well. She was in the ED this past weekend (BP in the 200's). They did an [place] to make sure she didn't have a DVT, they also switched her medication. She was last seen 12-08-19 for Sclerotherapy ([place]). She would like to come in to be seen. Please advise

## 2020-02-03 NOTE — Telephone Encounter (Signed)
I spoke with the patient and she stated that yesterday her PCP switch her from amlodipine to atenolol and to contact our office for bilateral leg pain. The patient informed that she has being wearing her compression,elevating,using ice,and taking tylenol to help with bilateral leg pain. The patient stated that the pain is located in the calf area and above the knee. The patient brought up about getting more sclerotherapy and we working on this in the office.

## 2020-02-03 NOTE — Telephone Encounter (Signed)
The swelling may have been attributed to her extremely elevated BP.  Since we know that she doesn't have a DVT, that rules out anything emergently dangerous.  The patient should wear her compression socks if she has not, in addition to elevating her legs to see if that helps with her swelling and discomfort.  Also, the patient takes amlodipine and that is known to cause some ankle swelling.  Let's have her try the conservative measures for a week and if it doesn't improve she should call us back to let us know

## 2020-02-03 NOTE — Telephone Encounter (Signed)
Based on the patient's pain location, she may do better talking to her primary care.  The patient has large baker's cysts bilaterally and that is consistent with where the pain is located.  We do not treat baker's cysts.  Also, sclerotherapy would not be helpful in treating pain or swelling of that nature.  The patient can be seen by GS, no studies

## 2020-02-07 ENCOUNTER — Other Ambulatory Visit: Payer: Self-pay

## 2020-02-07 ENCOUNTER — Ambulatory Visit (INDEPENDENT_AMBULATORY_CARE_PROVIDER_SITE_OTHER): Payer: Medicare Other | Admitting: Vascular Surgery

## 2020-02-07 ENCOUNTER — Encounter (INDEPENDENT_AMBULATORY_CARE_PROVIDER_SITE_OTHER): Payer: Self-pay | Admitting: Vascular Surgery

## 2020-02-07 VITALS — BP 147/98 | HR 81 | Resp 16 | Wt 138.8 lb

## 2020-02-07 DIAGNOSIS — M79604 Pain in right leg: Secondary | ICD-10-CM

## 2020-02-07 DIAGNOSIS — M79605 Pain in left leg: Secondary | ICD-10-CM

## 2020-02-07 DIAGNOSIS — J45909 Unspecified asthma, uncomplicated: Secondary | ICD-10-CM

## 2020-02-07 DIAGNOSIS — I10 Essential (primary) hypertension: Secondary | ICD-10-CM

## 2020-02-07 DIAGNOSIS — I8312 Varicose veins of left lower extremity with inflammation: Secondary | ICD-10-CM

## 2020-02-07 DIAGNOSIS — I8311 Varicose veins of right lower extremity with inflammation: Secondary | ICD-10-CM | POA: Diagnosis not present

## 2020-02-07 NOTE — Progress Notes (Signed)
MRN : [id number]  Andrea Ford is a 69 y.o. (12-05-1950) female who presents with chief complaint of No chief complaint on file. [name]  History of Present Illness:   The patient is seen for evaluation of painful lower extremities. Patient notes the pain is variable and not always associated with activity.  The pain is somewhat consistent day to day occurring on most days. The patient notes the pain also occurs with standing and routinely seems worse as the day wears on. The pain has been progressive over the past several years. The patient states these symptoms are causing  a profound negative impact on quality of life and daily activities.  The patient denies rest pain or dangling of an extremity off the side of the bed during the night for relief. No open wounds or sores at this time. No history of DVT or phlebitis. No prior interventions or surgeries.  There is a  history of back problems and DJD of the lumbar and sacral spine.    Current Meds  Medication Sig  . albuterol (PROVENTIL HFA;VENTOLIN HFA) 108 (90 BASE) MCG/ACT inhaler Inhale into the lungs every 6 (six) hours as needed for wheezing or shortness of breath.  [name] albuterol (PROVENTIL) (2.5 MG/3ML) 0.083% nebulizer solution Take 3 mLs (2.5 mg total) by nebulization every 6 (six) hours as needed for wheezing or shortness of breath.  [name] amoxicillin (AMOXIL) 500 MG capsule TAKE 1 CAPSULE BY MOUTH 4 TIMES DAILY FOR 10 DAYS  . atenolol (TENORMIN) 25 MG tablet Take 25 mg by mouth daily.  [name] atorvastatin (LIPITOR) 40 MG tablet Take 1 tablet (40 mg total) by mouth daily at 6 PM.  . busPIRone (BUSPAR) 10 MG tablet   . carvedilol (COREG) 6.25 MG tablet Take 6.25 mg by mouth at bedtime.  . Cholecalciferol (VITAMIN D-3 PO) Take 1 tablet by mouth daily.  . citalopram (CELEXA) 20 MG tablet Take 20 mg by mouth daily.   . clonazePAM (KLONOPIN) 0.5 MG tablet Take 0.25 mg by mouth 2 (two) times daily as needed for anxiety.  . dicyclomine  (BENTYL) 20 MG tablet Take 20 mg by mouth daily as needed for spasms.  [name] doxycycline (VIBRA-TABS) 100 MG tablet Take 100 mg by mouth 2 (two) times daily.  [name] estradiol (ESTRACE) 0.1 MG/GM vaginal cream INSERT 2 GRAMS INTO THE VAGINA DAILY FOR 1 TO 2 WEEKS THEN GRADUALLY REDUCE TO HALF THE INITIAL DOSE FOR 1 TO 2 WEEKS FOLLOWED BY A MAINTENA  . famotidine (PEPCID) 40 MG tablet Take 1 tablet (40 mg total) by mouth at bedtime.  . furosemide (LASIX) 20 MG tablet Take 20 mg by mouth daily.  . hydrOXYzine (ATARAX/VISTARIL) 25 MG tablet Take 25 mg by mouth daily as needed.  [name] losartan (COZAAR) 50 MG tablet Take 50 mg by mouth daily.  . Multiple Vitamin (MULTIVITAMIN) capsule Take by mouth.  . rizatriptan (MAXALT-MLT) 10 MG disintegrating tablet Take 10 mg by mouth daily as needed.  . sodium chloride 0.9 % nebulizer solution Inhale into the lungs.  . sodium chloride 0.9 % nebulizer solution SMARTSIG:1 Vial(s) Via Inhaler Every 6 Hours PRN  . traZODone (DESYREL) 50 MG tablet Take 50 mg by mouth at bedtime as needed for sleep.  [name] triamcinolone cream (KENALOG) 0.1 % Apply topically as needed.     Past Medical History:  Diagnosis Date  . Asthma   . COPD (chronic obstructive pulmonary disease) ([place])   . Hypertension   . Migraine  Past Surgical History:  Procedure Laterality Date  . ANKLE SURGERY Right   . CARDIAC CATHETERIZATION Right 07/15/2016   Procedure: Left Heart Cath and Coronary Angiography;  Surgeon: Dionisio David, MD;  Location: [place] [facility];  Service: Cardiovascular;  Laterality: Right;  . CHOLECYSTECTOMY    . TUBAL LIGATION      Social History Social History   Tobacco Use  . Smoking status: Former Research scientist (life sciences)  . Smokeless tobacco: Never Used  Vaping Use  . Vaping Use: Never used  Substance Use Topics  . Alcohol use: No  . Drug use: No    Family History Family History  Problem Relation Age of Onset  . Hypertension Mother   . Migraines Mother   . Cancer Mother   .  Varicose Veins Mother   . Heart disease Father   . Arthritis Father   . Hypertension Father   . Diabetes Maternal Uncle   . Stroke Maternal Grandmother   . Stroke Maternal Grandfather   . Stroke Paternal Grandmother   . Stroke Paternal Grandfather     Allergies  Allergen Reactions  . Prednisone Rash and Other (See Comments)    hyper   . Diphenhydramine Hcl Other (See Comments)  . Other Other (See Comments)    Pet dander  . Oxycodone-Acetaminophen   . Codeine Itching and Other (See Comments)  . Diphenhydramine Anxiety  . Etodolac Rash, Nausea And Vomiting and Other (See Comments)  . Hydrocodone Itching  . Meperidine Rash and Other (See Comments)    Pt. Not aware  Of ever having any demerol   . Venlafaxine Rash and Other (See Comments)     REVIEW OF SYSTEMS (Negative unless checked)  Constitutional: [] Weight loss  [] Fever  [] Chills Cardiac: [] Chest pain   [] Chest pressure   [] Palpitations   [] Shortness of breath when laying flat   [] Shortness of breath with exertion. Vascular:  [] Pain in legs with walking   [x] Pain in legs at rest  [] History of DVT   [] Phlebitis   [] Swelling in legs   [x] Varicose veins   [] Non-healing ulcers Pulmonary:   [] Uses home oxygen   [] Productive cough   [] Hemoptysis   [] Wheeze  [] COPD   [] Asthma Neurologic:  [] Dizziness   [] Seizures   [] History of stroke   [] History of TIA  [] Aphasia   [] Vissual changes   [] Weakness or numbness in arm   [] Weakness or numbness in leg Musculoskeletal:   [] Joint swelling   [x] Joint pain   [x] Low back pain Hematologic:  [] Easy bruising  [] Easy bleeding   [] Hypercoagulable state   [] Anemic Gastrointestinal:  [] Diarrhea   [] Vomiting  [x] Gastroesophageal reflux/heartburn   [] Difficulty swallowing. Genitourinary:  [] Chronic kidney disease   [] Difficult urination  [] Frequent urination   [] Blood in urine Skin:  [] Rashes   [] Ulcers  Psychological:  [] History of anxiety   []  History of major depression.  Physical  Examination  There were no vitals filed for this visit. There is no height or weight on file to calculate BMI. Gen: WD/WN, NAD Head: [place]/AT, No temporalis wasting.  Ear/Nose/Throat: Hearing grossly intact, nares w/o erythema or drainage Eyes: PER, EOMI, sclera nonicteric.  Neck: Supple, no large masses.   Pulmonary:  Good air movement, no audible wheezing bilaterally, no use of accessory muscles.  Cardiac: RRR, no JVD Vascular: Large varicosities present extensively greater than 10 mm left.  Mild venous stasis changes to the legs bilaterally.  2+ soft pitting edema Vessel Right Left  Radial Palpable Palpable  PT  Palpable Palpable  DP Palpable Palpable  Gastrointestinal: Non-distended. No guarding/no peritoneal signs.  Musculoskeletal: M/S 5/5 throughout.  No deformity or atrophy.  Neurologic: CN 2-12 intact. Symmetrical.  Speech is fluent. Motor exam as listed above. Psychiatric: Judgment intact, Mood & affect appropriate for pt's clinical situation. Dermatologic: No rashes or ulcers noted.  No changes consistent with cellulitis.  CBC Lab Results  Component Value Date   WBC 7.4 01/30/2020   HGB 14.8 01/30/2020   HCT 42.9 01/30/2020   MCV 86.3 01/30/2020   PLT 292 01/30/2020    BMET    Component Value Date/Time   NA 140 01/30/2020 1351   NA 141 11/02/2014 0937   K 3.7 01/30/2020 1351   K 4.0 11/02/2014 0937   CL 103 01/30/2020 1351   CL 107 11/02/2014 0937   CO2 27 01/30/2020 1351   CO2 26 11/02/2014 0937   GLUCOSE 106 (H) 01/30/2020 1351   GLUCOSE 124 (H) 11/02/2014 0937   BUN 20 01/30/2020 1351   BUN 16 11/02/2014 0937   CREATININE 0.76 01/30/2020 1351   CREATININE 0.70 11/02/2014 0937   CALCIUM 9.1 01/30/2020 1351   CALCIUM 9.2 11/02/2014 0937   GFRNONAA >60 01/30/2020 1351   GFRNONAA >60 11/02/2014 0937   GFRAA >60 01/30/2020 1351   GFRAA >60 11/02/2014 0937   Estimated Creatinine Clearance: 60.8 mL/min (by C-G formula based on SCr of 0.76  mg/dL).  COAG Lab Results  Component Value Date   INR 0.91 08/12/2018   INR 0.94 07/13/2016    Radiology DG Chest 2 View  Result Date: 01/30/2020 CLINICAL DATA:  Chest discomfort. EXAM: CHEST - 2 VIEW COMPARISON:  02/28/2019 FINDINGS: Lungs are adequately inflated without focal airspace consolidation or effusion. Rounded density projects over the left upper lobe which is seen over the anterior chest wall external to the patient. Cardiomediastinal silhouette and remainder of the exam is unchanged. IMPRESSION: No active cardiopulmonary disease. Electronically Signed   By: Marin Olp M.D.   On: 01/30/2020 14:53    Assessment/Plan 1. Varicose veins of both lower extremities with inflammation Recommend:  The patient has had successful ablation of the previously incompetent saphenous venous system but still has persistent symptoms of pain and swelling that are having a negative impact on daily life and daily activities.  Patient should undergo injection sclerotherapy to treat the residual varicosities.  The risks, benefits and alternative therapies were reviewed in detail with the patient.  All questions were answered.  The patient agrees to proceed with sclerotherapy at their convenience.  The patient will continue wearing the graduated compression stockings and using the over-the-counter pain medications to treat her symptoms.   2. Essential hypertension Continue antihypertensive medications as already ordered, these medications have been reviewed and there are no changes at this time.   3. Asthma, unspecified asthma severity, unspecified whether complicated, unspecified whether persistent Continue pulmonary medications and aerosols as already ordered, these medications have been reviewed and there are no changes at this time.    4. Pain in both lower extremities Recommend:  I do not find evidence of Vascular pathology that would explain the patient's symptoms  The patient has  atypical pain symptoms for vascular disease  I do not find evidence of Vascular pathology that would explain the patient's symptoms and I suspect the patient is c/o pseudoclaudication.  Patient should have an evaluation of his LS spine which I defer to the primary service.  Noninvasive studies including venous ultrasound of the legs do not  identify vascular problems  The patient should continue walking and begin a more formal exercise program. The patient should continue his antiplatelet therapy and aggressive treatment of the lipid abnormalities. The patient should begin wearing graduated compression socks 15-20 mmHg strength to control her mild edema.  Further work-up of her lower extremity pain is deferred to the primary service      Hortencia Pilar, MD  02/07/2020 11:57 AM

## 2020-02-09 ENCOUNTER — Encounter (INDEPENDENT_AMBULATORY_CARE_PROVIDER_SITE_OTHER): Payer: Self-pay | Admitting: Vascular Surgery

## 2020-10-07 ENCOUNTER — Emergency Department
Admission: EM | Admit: 2020-10-07 | Discharge: 2020-10-07 | Disposition: A | Discharge status: Home / Self Care | Payer: Medicare Other | Attending: Student in an Organized Health Care Education/Training Program | Admitting: Student in an Organized Health Care Education/Training Program

## 2020-10-07 ENCOUNTER — Other Ambulatory Visit: Payer: Self-pay

## 2020-10-07 ENCOUNTER — Emergency Department: Payer: Medicare Other

## 2020-10-07 DIAGNOSIS — R197 Diarrhea, unspecified: Secondary | ICD-10-CM | POA: Diagnosis not present

## 2020-10-07 DIAGNOSIS — J449 Chronic obstructive pulmonary disease, unspecified: Secondary | ICD-10-CM | POA: Insufficient documentation

## 2020-10-07 DIAGNOSIS — R102 Pelvic and perineal pain: Secondary | ICD-10-CM

## 2020-10-07 DIAGNOSIS — F17200 Nicotine dependence, unspecified, uncomplicated: Secondary | ICD-10-CM | POA: Diagnosis not present

## 2020-10-07 DIAGNOSIS — Z79899 Other long term (current) drug therapy: Secondary | ICD-10-CM | POA: Diagnosis not present

## 2020-10-07 DIAGNOSIS — I10 Essential (primary) hypertension: Secondary | ICD-10-CM | POA: Diagnosis not present

## 2020-10-07 DIAGNOSIS — R1084 Generalized abdominal pain: Secondary | ICD-10-CM | POA: Diagnosis present

## 2020-10-07 DIAGNOSIS — R112 Nausea with vomiting, unspecified: Secondary | ICD-10-CM | POA: Diagnosis not present

## 2020-10-07 DIAGNOSIS — J45909 Unspecified asthma, uncomplicated: Secondary | ICD-10-CM | POA: Diagnosis not present

## 2020-10-07 LAB — CBC WITH DIFFERENTIAL/PLATELET
Abs Immature Granulocytes: 0.01 10*3/uL (ref 0.00–0.07)
Basophils Absolute: 0.1 10*3/uL (ref 0.0–0.1)
Basophils Relative: 1 %
Eosinophils Absolute: 0.1 10*3/uL (ref 0.0–0.5)
Eosinophils Relative: 1 %
HCT: 43.5 % (ref 36.0–46.0)
Hemoglobin: 14.5 g/dL (ref 12.0–15.0)
Immature Granulocytes: 0 %
Lymphocytes Relative: 30 %
Lymphs Abs: 1.7 10*3/uL (ref 0.7–4.0)
MCH: 28.8 pg (ref 26.0–34.0)
MCHC: 33.3 g/dL (ref 30.0–36.0)
MCV: 86.5 fL (ref 80.0–100.0)
Monocytes Absolute: 0.4 10*3/uL (ref 0.1–1.0)
Monocytes Relative: 8 %
Neutro Abs: 3.3 10*3/uL (ref 1.7–7.7)
Neutrophils Relative %: 60 %
Platelets: 261 10*3/uL (ref 150–400)
RBC: 5.03 MIL/uL (ref 3.87–5.11)
RDW: 11.9 % (ref 11.5–15.5)
WBC: 5.5 10*3/uL (ref 4.0–10.5)
nRBC: 0 % (ref 0.0–0.2)

## 2020-10-07 LAB — COMPREHENSIVE METABOLIC PANEL
ALT: 20 U/L (ref 0–44)
AST: 21 U/L (ref 15–41)
Albumin: 4.4 g/dL (ref 3.5–5.0)
Alkaline Phosphatase: 62 U/L (ref 38–126)
Anion gap: 9 (ref 5–15)
BUN: 14 mg/dL (ref 8–23)
CO2: 24 mmol/L (ref 22–32)
Calcium: 9.3 mg/dL (ref 8.9–10.3)
Chloride: 104 mmol/L (ref 98–111)
Creatinine, Ser: 0.58 mg/dL (ref 0.44–1.00)
GFR, Estimated: >60 mL/min (ref >60)
Glucose, Bld: 102 mg/dL — ABNORMAL HIGH (ref 70–99)
Potassium: 4.1 mmol/L (ref 3.5–5.1)
Sodium: 137 mmol/L (ref 135–145)
Total Bilirubin: 1 mg/dL (ref 0.3–1.2)
Total Protein: 7.7 g/dL (ref 6.5–8.1)

## 2020-10-07 LAB — URINALYSIS, ROUTINE W REFLEX MICROSCOPIC
Bacteria, UA: NONE SEEN
Bilirubin Urine: NEGATIVE
Glucose, UA: NEGATIVE mg/dL
Ketones, ur: NEGATIVE mg/dL
Leukocytes,Ua: NEGATIVE
Nitrite: NEGATIVE
Protein, ur: NEGATIVE mg/dL
Specific Gravity, Urine: 1.002 — ABNORMAL LOW (ref 1.005–1.030)
pH: 6 (ref 5.0–8.0)

## 2020-10-07 MED ORDER — IOHEXOL 9 MG/ML PO SOLN
500.0000 mL | ORAL | Status: AC
  Administered 2020-10-07: 500 mL via ORAL

## 2020-10-07 MED ORDER — CLONAZEPAM 0.25 MG PO TBDP
0.2500 mg | ORAL_TABLET | Freq: Once | ORAL | Status: AC
  Administered 2020-10-07: 0.25 mg via ORAL
  Filled 2020-10-07: qty 1

## 2020-10-07 MED ORDER — CLONAZEPAM 0.5 MG PO TABS: 0.2500 mg | ORAL_TABLET | Freq: Once | ORAL | Status: DC

## 2020-10-07 MED ORDER — IOHEXOL 300 MG/ML  SOLN
100.0000 mL | Freq: Once | INTRAMUSCULAR | Status: AC | PRN
  Administered 2020-10-07: 100 mL via INTRAVENOUS

## 2020-10-07 NOTE — ED Notes (Signed)
Patient to CT at this time

## 2020-10-07 NOTE — ED Provider Notes (Signed)
[facility] Emergency Department Provider Note    Event Date/Time   First MD Initiated Contact with Patient 10/07/20 1657     (approximate)  I have reviewed the triage vital signs and the nursing notes.   HISTORY  Chief Complaint Pelvic Pain    HPI Andrea Ford is a 70 y.o. female presents to the ER for evaluation of generalized abdominal pain associated with some watery stool nausea vomiting.  Has had gallbladder removed.  No measured fevers.  Has followed up with her PCP had scheduled CT imaging as an outpatient but this not to be done until next week and her pain is getting worse.  Also currently treating for shingles.  Had Covid 2 months ago.  Was evaluated for vaginal uterine prolapse some not found to have that.  Not sexually active.    Past Medical History:  Diagnosis Date   Asthma    COPD (chronic obstructive pulmonary disease) (HCC)    Hypertension    Migraine    Family History  Problem Relation Age of Onset   Hypertension Mother    Migraines Mother    Cancer Mother    Varicose Veins Mother    Heart disease Father    Arthritis Father    Hypertension Father    Diabetes Maternal Uncle    Stroke Maternal Grandmother    Stroke Maternal Grandfather    Stroke Paternal Grandmother    Stroke Paternal Grandfather    Past Surgical History:  Procedure Laterality Date   ANKLE SURGERY Right    CARDIAC CATHETERIZATION Right 07/15/2016   Procedure: Left Heart Cath and Coronary Angiography;  Surgeon: Dionisio David, MD;  Location: [facility];  Service: Cardiovascular;  Laterality: Right;   CHOLECYSTECTOMY     TUBAL LIGATION     Patient Active Problem List   Diagnosis Date Noted   Fibromyalgia 07/12/2019   Age-related osteoporosis without current pathological fracture 06/29/2019   Irritable bowel syndrome with both constipation and diarrhea 03/16/2019   Small intestinal bacterial overgrowth 03/16/2019    Leg pain 12/21/2018   Varicose veins of both lower extremities with inflammation 11/30/2018   Microphthalmos, 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   Senile nuclear sclerosis, right 09/04/2018   Enrolled in chronic care management 07/14/2018   Nocturia 01/16/2018   Uterine leiomyoma 01/16/2018   Mixed stress and urge urinary incontinence 12/09/2017   Dysfunction of both eustachian tubes 09/12/2017   Severe episode of recurrent major depressive disorder, without psychotic features ([place]) 08/13/2016   Accelerated hypertension 07/13/2016   Chest pain 07/13/2016   Abnormal EKG 07/13/2016   GAD (generalized anxiety disorder) 07/13/2016   Migraine 03/22/2016   Obstructive airway disease ([place]) 11/02/2015   Chronic cough 05/03/2015   Former cigarette smoker 05/03/2015   Thickened endometrium 05/03/2015   Essential hypertension 02/06/2015   Anxiety with obsessional features 12/28/2012   Eczema 12/28/2012   Allergic rhinitis 03/30/2012   Asthma 12/17/2011   GERD without esophagitis 01/26/2003      Prior to Admission medications   Medication Sig Start Date End Date Taking? Authorizing Provider  albuterol (PROVENTIL HFA;VENTOLIN HFA) 108 (90 BASE) MCG/ACT inhaler Inhale into the lungs every 6 (six) hours as needed for wheezing or shortness of breath.    [provider]  albuterol (PROVENTIL) (2.5 MG/3ML) 0.083% nebulizer solution Take 3 mLs (2.5 mg total) by nebulization every 6 (six) hours as needed for wheezing or shortness of breath. 06/25/18   Carrie Mew, MD  amoxicillin (AMOXIL) 500  MG capsule TAKE 1 CAPSULE BY MOUTH 4 TIMES DAILY FOR 10 DAYS Patient not taking: Reported on 02/07/2020 02/19/19   [provider]  atenolol (TENORMIN) 25 MG tablet Take 25 mg by mouth daily. 02/03/20   [provider]  atorvastatin (LIPITOR) 40 MG tablet Take 1 tablet (40 mg total) by mouth daily at 6 PM. 07/15/16   Demetrios Loll, MD  busPIRone (BUSPAR) 10 MG tablet  02/28/19    [provider]  carvedilol (COREG) 6.25 MG tablet Take 6.25 mg by mouth at bedtime.    [provider]  Cholecalciferol (VITAMIN D-3 PO) Take 1 tablet by mouth daily.    [provider]  citalopram (CELEXA) 20 MG tablet Take 20 mg by mouth daily.  05/02/16   [provider]  clonazePAM (KLONOPIN) 0.5 MG tablet Take 0.25 mg by mouth 2 (two) times daily as needed for anxiety.    [provider]  dicyclomine (BENTYL) 20 MG tablet Take 20 mg by mouth daily as needed for spasms.    [provider]  doxycycline (VIBRA-TABS) 100 MG tablet Take 100 mg by mouth 2 (two) times daily.    [provider]  estradiol (ESTRACE) 0.1 MG/GM vaginal cream INSERT 2 GRAMS INTO THE VAGINA DAILY FOR 1 TO 2 WEEKS THEN GRADUALLY REDUCE TO HALF THE INITIAL DOSE FOR 1 TO 2 WEEKS FOLLOWED BY A MAINTENA 08/02/19   [provider]  famotidine (PEPCID) 40 MG tablet Take 1 tablet (40 mg total) by mouth at bedtime. 03/01/19 02/29/20  Rudene Re, MD  furosemide (LASIX) 20 MG tablet Take 20 mg by mouth daily. 02/17/19   [provider]  hydrOXYzine (ATARAX/VISTARIL) 25 MG tablet Take 25 mg by mouth daily as needed. 05/02/16   [provider]  losartan (COZAAR) 100 MG tablet Take 100 mg by mouth daily. 02/01/16 07/26/19  [provider]  losartan (COZAAR) 50 MG tablet Take 50 mg by mouth daily. 08/11/19   [provider]  Multiple Vitamin (MULTIVITAMIN) capsule Take by mouth.    [provider]  rizatriptan (MAXALT-MLT) 10 MG disintegrating tablet Take 10 mg by mouth daily as needed. 12/04/17   [provider]  sodium chloride 0.9 % nebulizer solution SMARTSIG:1 Vial(s) Via Inhaler Every 6 Hours PRN 09/22/19   [provider]  traZODone (DESYREL) 50 MG tablet Take 50 mg by mouth at bedtime as needed for sleep.    [provider]    Allergies Prednisone, Diphenhydramine hcl, Other,  Oxycodone-acetaminophen, Codeine, Diphenhydramine, Etodolac, Hydrocodone, Meperidine, and Venlafaxine    Social History Social History   Tobacco Use   Smoking status: Current Every Day Smoker   Smokeless tobacco: Never Used  Scientific laboratory technician Use: Never used  Substance Use Topics   Alcohol use: No   Drug use: No    Review of Systems Patient denies headaches, rhinorrhea, blurry vision, numbness, shortness of breath, chest pain, edema, cough, abdominal pain, nausea, vomiting, diarrhea, dysuria, fevers, rashes or hallucinations unless otherwise stated above in HPI. ____________________________________________   PHYSICAL EXAM:  VITAL SIGNS: Vitals:   10/07/20 2015 10/07/20 2117  BP: (!) 154/96 (!) 149/80  Pulse: 64 69  Resp: 18 18  Temp:    SpO2: 99% 100%    Constitutional: Alert and oriented.  Eyes: Conjunctivae are normal.  Head: Atraumatic. Nose: No congestion/rhinnorhea. Mouth/Throat: Mucous membranes are moist.   Neck: No stridor. Painless ROM.  Cardiovascular: Normal rate, regular rhythm. Grossly normal heart sounds.  Good peripheral circulation. Respiratory: Normal respiratory effort.  No retractions. Lungs CTAB. Gastrointestinal: Soft and nontender. No distention. No abdominal bruits. No CVA tenderness. Genitourinary:  Musculoskeletal: No lower extremity tenderness nor edema.  No joint effusions. Neurologic:  Normal speech and language. No gross focal neurologic deficits are appreciated. No facial droop Skin:  Skin is warm, dry and intact. No rash noted. Psychiatric: Mood and affect are normal. Speech and behavior are normal.  ____________________________________________   LABS (all labs ordered are listed, but only abnormal results are displayed)  Results for orders placed or performed during the hospital encounter of 10/07/20 (from the past 24 hour(s))  Urinalysis, Routine w reflex microscopic     Status: Abnormal   Collection Time: 10/07/20  1:01  PM  Result Value Ref Range   Color, Urine STRAW (A) YELLOW   APPearance CLEAR (A) CLEAR   Specific Gravity, Urine 1.002 (L) 1.005 - 1.030   pH 6.0 5.0 - 8.0   Glucose, UA NEGATIVE NEGATIVE mg/dL   Hgb urine dipstick SMALL (A) NEGATIVE   Bilirubin Urine NEGATIVE NEGATIVE   Ketones, ur NEGATIVE NEGATIVE mg/dL   Protein, ur NEGATIVE NEGATIVE mg/dL   Nitrite NEGATIVE NEGATIVE   Leukocytes,Ua NEGATIVE NEGATIVE   RBC / HPF 0-5 0 - 5 RBC/hpf   WBC, UA 0-5 0 - 5 WBC/hpf   Bacteria, UA NONE SEEN NONE SEEN   Squamous Epithelial / LPF 0-5 0 - 5  CBC with Differential     Status: None   Collection Time: 10/07/20  1:01 PM  Result Value Ref Range   WBC 5.5 4.0 - 10.5 K/uL   RBC 5.03 3.87 - 5.11 MIL/uL   Hemoglobin 14.5 12.0 - 15.0 g/dL   HCT 43.5 36.0 - 46.0 %   MCV 86.5 80.0 - 100.0 fL   MCH 28.8 26.0 - 34.0 pg   MCHC 33.3 30.0 - 36.0 g/dL   RDW 11.9 11.5 - 15.5 %   Platelets 261 150 - 400 K/uL   nRBC 0.0 0.0 - 0.2 %   Neutrophils Relative % 60 %   Neutro Abs 3.3 1.7 - 7.7 K/uL   Lymphocytes Relative 30 %   Lymphs Abs 1.7 0.7 - 4.0 K/uL   Monocytes Relative 8 %   Monocytes Absolute 0.4 0.1 - 1.0 K/uL   Eosinophils Relative 1 %   Eosinophils Absolute 0.1 0.0 - 0.5 K/uL   Basophils Relative 1 %   Basophils Absolute 0.1 0.0 - 0.1 K/uL   Immature Granulocytes 0 %   Abs Immature Granulocytes 0.01 0.00 - 0.07 K/uL  Comprehensive metabolic panel     Status: Abnormal   Collection Time: 10/07/20  1:01 PM  Result Value Ref Range   Sodium 137 135 - 145 mmol/L   Potassium 4.1 3.5 - 5.1 mmol/L   Chloride 104 98 - 111 mmol/L   CO2 24 22 - 32 mmol/L   Glucose, Bld 102 (H) 70 - 99 mg/dL   BUN 14 8 - 23 mg/dL   Creatinine, Ser 0.58 0.44 - 1.00 mg/dL   Calcium 9.3 8.9 - 10.3 mg/dL   Total Protein 7.7 6.5 - 8.1 g/dL   Albumin 4.4 3.5 - 5.0 g/dL   AST 21 15 - 41 U/L   ALT 20 0 - 44 U/L   Alkaline Phosphatase 62 38 - 126 U/L   Total Bilirubin 1.0 0.3 - 1.2 mg/dL   GFR, Estimated >60 >60  mL/min   Anion gap 9 5 - 15  ____________________________________________ ____________________________________________  [id number]  I personally reviewed all radiographic images ordered to evaluate for the above acute complaints and reviewed radiology reports and findings.  These findings were personally discussed with the patient.  Please see medical record for radiology report.  ____________________________________________   PROCEDURES  Procedure(s) performed:  Procedures    Critical Care performed: no ____________________________________________   INITIAL IMPRESSION / ASSESSMENT AND PLAN / ED COURSE  Pertinent labs & imaging results that were available during my care of the patient were reviewed by me and considered in my medical decision making (see chart for details).   DDX: Colitis, mass, fibroid, diverticulitis, hernia, cystitis, post herpetic neuralgia  TANNISHA KENNINGTON is a 70 y.o. who presents to the ED with presentation of symptoms as described above.  Patient nontoxic-appearing somewhat anxious.  Does have some mild abdominal pain but no guarding or rebound no appreciable hernia.  Postherpetic wound appears to be appropriately healing.  Given her worsening pain and history will order CT imaging to evaluate for the but differential.  Clinical Course as of 10/07/20 2138  Sat Oct 07, 2020  2127 Patient reassessed. Discussed the results of her CT imaging and work-up thus far. No clear identifiable etiology of her symptoms. Patient has OB/GYN follow-up which I think is the next appropriate step. Discussed signs and symptoms for which she should return to the ER. [PR]    Clinical Course User Index [PR] Merlyn Lot, MD    The patient was evaluated in Emergency Department today for the symptoms described in the history of present illness. He/she was evaluated in the context of the global COVID-19 pandemic, which necessitated consideration that the patient might be at  risk for infection with the SARS-CoV-2 virus that causes COVID-19. Institutional protocols and algorithms that pertain to the evaluation of patients at risk for COVID-19 are in a state of rapid change based on information released by regulatory bodies including the CDC and federal and state organizations. These policies and algorithms were followed during the patient's care in the ED.  As part of my medical decision making, I reviewed the following data within the [place] notes reviewed and incorporated, Labs reviewed, notes from prior ED visits and [place] Controlled Substance Database   ____________________________________________   FINAL CLINICAL IMPRESSION(S) / ED DIAGNOSES  Final diagnoses:  Pelvic pain in female      NEW MEDICATIONS STARTED DURING THIS VISIT:  New Prescriptions   No medications on file     Note:  This document was prepared using Dragon voice recognition software and may include unintentional dictation errors.    Merlyn Lot, MD 10/07/20 2139

## 2020-10-07 NOTE — Discharge Instructions (Addendum)
You have been seen in the emergency department for emergency care. It is important that you contact your own doctor, specialist or the closest clinic for follow-up care. Please bring this instruction sheet, all medications and X-ray copies with you when you are seen for follow-up care.  Determining the exact cause for all patients with abdominal pain is extremely difficult in the emergency department. Our primary focus is to rule-out immediate life-threatening diseases. If no immediate source of pain is found the definitive diagnosis frequently needs to be determined over time.Many times your primary care physician can determine the cause by following the symptoms over time. Sometimes, specialist are required such as Gastroenterologists, Gynecologists, Urologists or Surgeons. Please return immediately to the Emergency Department for fever>101, Vomiting or Intractable Pain. You should return to the emergency department or see your primary care provider in 12-24hrs if your pain is no better and sooner if your pain becomes worse.  CLINICAL DATA:  Acute nonlocalized abdominal pain. History of fibroids and polyps of the uterus. Pelvic pain that radiates to her groin and lower back.   EXAM: CT ABDOMEN AND PELVIS WITH CONTRAST   TECHNIQUE: Multidetector CT imaging of the abdomen and pelvis was performed using the standard protocol following bolus administration of intravenous contrast.   CONTRAST:  158mL OMNIPAQUE IOHEXOL 300 MG/ML  SOLN   COMPARISON:  CT abdomen pelvis 07/24/2014   FINDINGS: Lower chest: No acute abnormality. Peripherally calcified breast implants bilaterally.   Hepatobiliary: No focal liver abnormality. The gallbladder is not visualized and is likely surgically absent. No biliary dilatation.   Pancreas: No focal lesion. Normal pancreatic contour. No surrounding inflammatory changes. No main pancreatic ductal dilatation.   Spleen: Normal in size without focal abnormality.    Adrenals/Urinary Tract: No adrenal nodule bilaterally. Bilateral kidneys enhance symmetrically. Subcentimeter hypodensities are too small to characterize. No hydronephrosis. No hydroureter. The urinary bladder is unremarkable. On delayed imaging, there is no urothelial wall thickening and there are no filling defects in the opacified portions of the bilateral collecting systems or ureters.   Stomach/Bowel: PO contrast reaches the rectum. Stomach is within normal limits. No evidence of bowel wall thickening or dilatation. Appendix appears normal.   Vascular/Lymphatic: No abdominal aorta or iliac aneurysm. Moderate to severe atherosclerotic plaque of the aorta and its branches. No abdominal, pelvic, or inguinal lymphadenopathy.   Reproductive: Uterus and bilateral adnexa are unremarkable.   Other: No intraperitoneal free fluid. No intraperitoneal free gas. No organized fluid collection.   Musculoskeletal:   No abdominal wall hernia or abnormality.   No suspicious lytic or blastic osseous lesions. No acute displaced fracture. Multilevel mild degenerative changes of the spine.   IMPRESSION: 1. No acute intra-abdominal or intrapelvic abnormality. 2.  Aortic Atherosclerosis (ICD10-I70.0).     Electronically Signed   By: Iven Finn M.D.   On: 10/07/2020 21:13

## 2020-10-07 NOTE — ED Triage Notes (Signed)
Pt states she has a hx of fibroids and polyps on her uterus- pt states that she is having pelvic pain that radiates into her groin and into her lower back- pt states that she sees a dr in [place] for it and is scheduled for a CT scan on Wednesday but that pain has gotten worse- pt states she had diarrhea yesterday and took pepto

## 2021-01-16 ENCOUNTER — Other Ambulatory Visit: Payer: Self-pay

## 2021-01-16 ENCOUNTER — Emergency Department
Admission: EM | Admit: 2021-01-16 | Discharge: 2021-01-16 | Disposition: A | Discharge status: Home / Self Care | Payer: Medicare Other | Attending: Emergency Medicine | Admitting: Emergency Medicine

## 2021-01-16 ENCOUNTER — Encounter: Payer: Self-pay | Admitting: Emergency Medicine

## 2021-01-16 DIAGNOSIS — I10 Essential (primary) hypertension: Secondary | ICD-10-CM | POA: Diagnosis not present

## 2021-01-16 DIAGNOSIS — Z79899 Other long term (current) drug therapy: Secondary | ICD-10-CM | POA: Insufficient documentation

## 2021-01-16 DIAGNOSIS — J449 Chronic obstructive pulmonary disease, unspecified: Secondary | ICD-10-CM | POA: Diagnosis not present

## 2021-01-16 DIAGNOSIS — F17210 Nicotine dependence, cigarettes, uncomplicated: Secondary | ICD-10-CM | POA: Insufficient documentation

## 2021-01-16 DIAGNOSIS — J45909 Unspecified asthma, uncomplicated: Secondary | ICD-10-CM | POA: Diagnosis not present

## 2021-01-16 DIAGNOSIS — R110 Nausea: Secondary | ICD-10-CM | POA: Insufficient documentation

## 2021-01-16 DIAGNOSIS — R197 Diarrhea, unspecified: Secondary | ICD-10-CM | POA: Insufficient documentation

## 2021-01-16 DIAGNOSIS — J029 Acute pharyngitis, unspecified: Secondary | ICD-10-CM | POA: Insufficient documentation

## 2021-01-16 DIAGNOSIS — Z20822 Contact with and (suspected) exposure to covid-19: Secondary | ICD-10-CM | POA: Diagnosis not present

## 2021-01-16 LAB — COMPREHENSIVE METABOLIC PANEL
ALT: 21 U/L (ref 0–44)
AST: 25 U/L (ref 15–41)
Albumin: 4.4 g/dL (ref 3.5–5.0)
Alkaline Phosphatase: 65 U/L (ref 38–126)
Anion gap: 11 (ref 5–15)
BUN: 16 mg/dL (ref 8–23)
CO2: 22 mmol/L (ref 22–32)
Calcium: 9.1 mg/dL (ref 8.9–10.3)
Chloride: 105 mmol/L (ref 98–111)
Creatinine, Ser: 0.67 mg/dL (ref 0.44–1.00)
GFR, Estimated: >60 mL/min (ref >60)
Glucose, Bld: 105 mg/dL — ABNORMAL HIGH (ref 70–99)
Potassium: 3.8 mmol/L (ref 3.5–5.1)
Sodium: 138 mmol/L (ref 135–145)
Total Bilirubin: 1.4 mg/dL — ABNORMAL HIGH (ref 0.3–1.2)
Total Protein: 7.6 g/dL (ref 6.5–8.1)

## 2021-01-16 LAB — LIPASE, BLOOD: Lipase: 34 U/L (ref 11–51)

## 2021-01-16 LAB — CBC
HCT: 41.8 % (ref 36.0–46.0)
Hemoglobin: 14.3 g/dL (ref 12.0–15.0)
MCH: 29.5 pg (ref 26.0–34.0)
MCHC: 34.2 g/dL (ref 30.0–36.0)
MCV: 86.4 fL (ref 80.0–100.0)
Platelets: 226 10*3/uL (ref 150–400)
RBC: 4.84 MIL/uL (ref 3.87–5.11)
RDW: 11.9 % (ref 11.5–15.5)
WBC: 4.8 10*3/uL (ref 4.0–10.5)
nRBC: 0 % (ref 0.0–0.2)

## 2021-01-16 LAB — URINALYSIS, COMPLETE (UACMP) WITH MICROSCOPIC
Bacteria, UA: NONE SEEN
Bilirubin Urine: NEGATIVE
Glucose, UA: NEGATIVE mg/dL
Ketones, ur: NEGATIVE mg/dL
Leukocytes,Ua: NEGATIVE
Nitrite: NEGATIVE
Protein, ur: NEGATIVE mg/dL
Specific Gravity, Urine: 1.003 — ABNORMAL LOW (ref 1.005–1.030)
pH: 6 (ref 5.0–8.0)

## 2021-01-16 LAB — SARS CORONAVIRUS 2 (TAT 6-24 HRS): SARS Coronavirus 2: NEGATIVE

## 2021-01-16 MED ORDER — ONDANSETRON HCL 4 MG/2ML IJ SOLN
4.0000 mg | Freq: Once | INTRAMUSCULAR | Status: AC
  Administered 2021-01-16: 4 mg via INTRAVENOUS
  Filled 2021-01-16: qty 2

## 2021-01-16 MED ORDER — SODIUM CHLORIDE 0.9 % IV BOLUS
1000.0000 mL | Freq: Once | INTRAVENOUS | Status: AC
  Administered 2021-01-16: 1000 mL via INTRAVENOUS

## 2021-01-16 NOTE — ED Notes (Signed)
See triage note  Presents with scratchy throat and diarrhea  States she was seen at [facility] for same  They sent off a COVID test  She does not have the results   Cont's with diarrhea  And some nausea after eating    Afebrile on arrival

## 2021-01-16 NOTE — ED Triage Notes (Signed)
C/O diarrhea since Sunday.  Seen through Urgent Care, tested for flu, covid - both negative.  Send out COVID pending.  Patient concerned she is dehydrated.  Also c/o nausea and scratchy throat.  AAOx3.  Skin warm and dry. NAD

## 2021-01-16 NOTE — ED Provider Notes (Signed)
[facility] Emergency Department Provider Note   ____________________________________________   Event Date/Time   First MD Initiated Contact with Patient 01/16/21 1036     (approximate)  I have reviewed the triage vital signs and the nursing notes.   HISTORY  Chief Complaint Diarrhea    HPI Andrea Ford is a 70 y.o. female with possible history of hypertension, asthma, fibromyalgia, and generalized anxiety disorder who presents to the ED complaining of diarrhea.  Patient reports that she has had about 4 days of profuse watery diarrhea along with nausea.  She has not noticed any blood in her stool and denies any abdominal pain or vomiting.  She has not had any fevers but does endorse a sore throat.  She was seen at urgent care 2 days ago for the symptoms, where rapid testing for COVID-19 was negative and follow-up PCR was sent, but patient does not yet have results.  She was told not to take any medication for the diarrhea, has been taking Zofran with relief of nausea.  She denies any fevers, cough, chest pain, or shortness of breath.        Past Medical History:  Diagnosis Date  . Asthma   . COPD (chronic obstructive pulmonary disease) ([place])   . Hypertension   . Migraine     Patient Active Problem List   Diagnosis Date Noted  . Fibromyalgia 07/12/2019  . Age-related osteoporosis without current pathological fracture 06/29/2019  . Irritable bowel syndrome with both constipation and diarrhea 03/16/2019  . Small intestinal bacterial overgrowth 03/16/2019  . Leg pain 12/21/2018  . Varicose veins of both lower extremities with inflammation 11/30/2018  . Microphthalmos, 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  . Senile nuclear sclerosis, right 09/04/2018  . Enrolled in chronic care management 07/14/2018  . Nocturia 01/16/2018  . Uterine leiomyoma 01/16/2018  . Mixed stress and urge urinary incontinence 12/09/2017  . Dysfunction of both eustachian tubes 09/12/2017  .  Severe episode of recurrent major depressive disorder, without psychotic features ([place]) 08/13/2016  . Accelerated hypertension 07/13/2016  . Chest pain 07/13/2016  . Abnormal EKG 07/13/2016  . GAD (generalized anxiety disorder) 07/13/2016  . Migraine 03/22/2016  . Obstructive airway disease ([place]) 11/02/2015  . Chronic cough 05/03/2015  . Former cigarette smoker 05/03/2015  . Thickened endometrium 05/03/2015  . Essential hypertension 02/06/2015  . Anxiety with obsessional features 12/28/2012  . Eczema 12/28/2012  . Allergic rhinitis 03/30/2012  . Asthma 12/17/2011  . GERD without esophagitis 01/26/2003    Past Surgical History:  Procedure Laterality Date  . ANKLE SURGERY Right   . CARDIAC CATHETERIZATION Right 07/15/2016   Procedure: Left Heart Cath and Coronary Angiography;  Surgeon: Dionisio David, MD;  Location: [place] [facility];  Service: Cardiovascular;  Laterality: Right;  . CHOLECYSTECTOMY    . TUBAL LIGATION      Prior to Admission medications   Medication Sig Start Date End Date Taking? Authorizing Provider  albuterol (PROVENTIL HFA;VENTOLIN HFA) 108 (90 BASE) MCG/ACT inhaler Inhale into the lungs every 6 (six) hours as needed for wheezing or shortness of breath.    [provider]  albuterol (PROVENTIL) (2.5 MG/3ML) 0.083% nebulizer solution Take 3 mLs (2.5 mg total) by nebulization every 6 (six) hours as needed for wheezing or shortness of breath. 06/25/18   Carrie Mew, MD  atenolol (TENORMIN) 25 MG tablet Take 25 mg by mouth daily. 02/03/20   [provider]  atorvastatin (LIPITOR) 40 MG tablet Take 1 tablet (40 mg total)  by mouth daily at 6 PM. 07/15/16   Demetrios Loll, MD  busPIRone (BUSPAR) 10 MG tablet  02/28/19   [provider]  carvedilol (COREG) 6.25 MG tablet Take 6.25 mg by mouth at bedtime.    [provider]  Cholecalciferol (VITAMIN D-3 PO) Take 1 tablet by mouth daily.    [provider]  citalopram (CELEXA)  20 MG tablet Take 20 mg by mouth daily.  05/02/16   [provider]  clonazePAM (KLONOPIN) 0.5 MG tablet Take 0.25 mg by mouth 2 (two) times daily as needed for anxiety.    [provider]  dicyclomine (BENTYL) 20 MG tablet Take 20 mg by mouth daily as needed for spasms.    [provider]  doxycycline (VIBRA-TABS) 100 MG tablet Take 100 mg by mouth 2 (two) times daily.    [provider]  estradiol (ESTRACE) 0.1 MG/GM vaginal cream INSERT 2 GRAMS INTO THE VAGINA DAILY FOR 1 TO 2 WEEKS THEN GRADUALLY REDUCE TO HALF THE INITIAL DOSE FOR 1 TO 2 WEEKS FOLLOWED BY A MAINTENA 08/02/19   [provider]  famotidine (PEPCID) 40 MG tablet Take 1 tablet (40 mg total) by mouth at bedtime. 03/01/19 02/29/20  Rudene Re, MD  furosemide (LASIX) 20 MG tablet Take 20 mg by mouth daily. 02/17/19   [provider]  hydrOXYzine (ATARAX/VISTARIL) 25 MG tablet Take 25 mg by mouth daily as needed. 05/02/16   [provider]  losartan (COZAAR) 100 MG tablet Take 100 mg by mouth daily. 02/01/16 07/26/19  [provider]  losartan (COZAAR) 50 MG tablet Take 50 mg by mouth daily. 08/11/19   [provider]  Multiple Vitamin (MULTIVITAMIN) capsule Take by mouth.    [provider]  rizatriptan (MAXALT-MLT) 10 MG disintegrating tablet Take 10 mg by mouth daily as needed. 12/04/17   [provider]  sodium chloride 0.9 % nebulizer solution SMARTSIG:1 Vial(s) Via Inhaler Every 6 Hours PRN 09/22/19   [provider]  traZODone (DESYREL) 50 MG tablet Take 50 mg by mouth at bedtime as needed for sleep.    [provider]    Allergies Prednisone, Diphenhydramine hcl, Other, Oxycodone-acetaminophen, Codeine, Diphenhydramine, Etodolac, Hydrocodone, Meperidine, and Venlafaxine  Family History  Problem Relation Age of Onset  . Hypertension Mother   . Migraines Mother   . Cancer Mother   . Varicose Veins Mother   .  Heart disease Father   . Arthritis Father   . Hypertension Father   . Diabetes Maternal Uncle   . Stroke Maternal Grandmother   . Stroke Maternal Grandfather   . Stroke Paternal Grandmother   . Stroke Paternal Grandfather     Social History Social History   Tobacco Use  . Smoking status: Current Every Day Smoker  . Smokeless tobacco: Never Used  Vaping Use  . Vaping Use: Never used  Substance Use Topics  . Alcohol use: No  . Drug use: No    Review of Systems  Constitutional: No fever/chills Eyes: No visual changes. ENT: Positive for sore throat. Cardiovascular: Denies chest pain. Respiratory: Denies shortness of breath. Gastrointestinal: No abdominal pain.  Positive for nausea, no vomiting.  Positive for diarrhea.  No constipation. Genitourinary: Negative for dysuria. Musculoskeletal: Negative for back pain. Skin: Negative for rash. Neurological: Negative for headaches, focal weakness or numbness.  ____________________________________________   PHYSICAL EXAM:  VITAL SIGNS: ED Triage Vitals  Enc Vitals Group     BP 01/16/21 1030 (!) 157/82  Pulse Rate 01/16/21 1030 89     Resp 01/16/21 1030 16     Temp 01/16/21 1030 98.1 F (36.7 C)     Temp Source 01/16/21 1030 Oral     SpO2 01/16/21 1030 97 %     Weight 01/16/21 1028 149 lb 0.5 oz (67.6 kg)     Height 01/16/21 1028 5' 1.5" (1.562 m)     Head Circumference --      Peak Flow --      Pain Score 01/16/21 1028 0     Pain Loc --      Pain Edu? --      Excl. in [place]? --     Constitutional: Alert and oriented. Eyes: Conjunctivae are normal. Head: Atraumatic. Nose: No congestion/rhinnorhea. Mouth/Throat: Mucous membranes are moist. Neck: Normal ROM Cardiovascular: Normal rate, regular rhythm. Grossly normal heart sounds. Respiratory: Normal respiratory effort.  No retractions. Lungs CTAB. Gastrointestinal: Soft and nontender. No distention. Genitourinary: deferred Musculoskeletal: No lower extremity  tenderness nor edema. Neurologic:  Normal speech and language. No gross focal neurologic deficits are appreciated. Skin:  Skin is warm, dry and intact. No rash noted. Psychiatric: Mood and affect are normal. Speech and behavior are normal.  ____________________________________________   LABS (all labs ordered are listed, but only abnormal results are displayed)  Labs Reviewed  COMPREHENSIVE METABOLIC PANEL - Abnormal; Notable for the following components:      Result Value   Glucose, Bld 105 (*)    Total Bilirubin 1.4 (*)    All other components within normal limits  URINALYSIS, COMPLETE (UACMP) WITH MICROSCOPIC - Abnormal; Notable for the following components:   Color, Urine STRAW (*)    APPearance CLEAR (*)    Specific Gravity, Urine 1.003 (*)    Hgb urine dipstick MODERATE (*)    All other components within normal limits  SARS CORONAVIRUS 2 (TAT 6-24 HRS)  LIPASE, BLOOD  CBC    PROCEDURES  Procedure(s) performed (including Critical Care):  Procedures   ____________________________________________   INITIAL IMPRESSION / ASSESSMENT AND PLAN / ED COURSE       70 year old female with past medical history of hypertension, asthma, fibromyalgia, and generalized anxiety disorder who presents to the ED complaining of 4 days of diarrhea associate with nausea and a sore throat.  Symptoms seem most consistent with viral syndrome and patient has no abdominal tenderness to suggest diverticulitis, appendicitis, or other acute intra-abdominal pathology.  We will perform testing for COVID-19, hydrate with IV fluids and give dose of IV Zofran.  Labs are unremarkable, no AKI or electrolyte abnormality noted.  Patient feeling better following Zofran and IV fluids, has tolerated p.o. here in the ED without difficulty.  COVID-19 testing is pending and patient counseled to quarantine until results are available.  UA shows no signs of infection.  Patient is appropriate for discharge home with  PCP follow-up, was counseled to return to the ED for new or worsening symptoms.  Patient agrees with plan.      ____________________________________________   FINAL CLINICAL IMPRESSION(S) / ED DIAGNOSES  Final diagnoses:  Diarrhea, unspecified type  Suspected COVID-19 virus infection     ED Discharge Orders    None       Note:  This document was prepared using Dragon voice recognition software and may include unintentional dictation errors.   Blake Divine, MD 01/16/21 [phone]

## 2021-02-21 ENCOUNTER — Emergency Department: Payer: Medicare Other

## 2021-02-21 ENCOUNTER — Emergency Department
Admission: EM | Admit: 2021-02-21 | Discharge: 2021-02-21 | Disposition: A | Discharge status: Home / Self Care | Payer: Medicare Other | Attending: Emergency Medicine | Admitting: Emergency Medicine

## 2021-02-21 ENCOUNTER — Other Ambulatory Visit: Payer: Self-pay

## 2021-02-21 DIAGNOSIS — J45909 Unspecified asthma, uncomplicated: Secondary | ICD-10-CM | POA: Insufficient documentation

## 2021-02-21 DIAGNOSIS — J40 Bronchitis, not specified as acute or chronic: Secondary | ICD-10-CM

## 2021-02-21 DIAGNOSIS — Z20822 Contact with and (suspected) exposure to covid-19: Secondary | ICD-10-CM | POA: Diagnosis not present

## 2021-02-21 DIAGNOSIS — I10 Essential (primary) hypertension: Secondary | ICD-10-CM | POA: Insufficient documentation

## 2021-02-21 DIAGNOSIS — J449 Chronic obstructive pulmonary disease, unspecified: Secondary | ICD-10-CM | POA: Diagnosis not present

## 2021-02-21 DIAGNOSIS — Z79899 Other long term (current) drug therapy: Secondary | ICD-10-CM | POA: Insufficient documentation

## 2021-02-21 DIAGNOSIS — F17200 Nicotine dependence, unspecified, uncomplicated: Secondary | ICD-10-CM | POA: Diagnosis not present

## 2021-02-21 DIAGNOSIS — R0602 Shortness of breath: Secondary | ICD-10-CM | POA: Diagnosis present

## 2021-02-21 LAB — BASIC METABOLIC PANEL WITH GFR
Anion gap: 8 (ref 5–15)
BUN: 12 mg/dL (ref 8–23)
CO2: 28 mmol/L (ref 22–32)
Calcium: 8.9 mg/dL (ref 8.9–10.3)
Chloride: 104 mmol/L (ref 98–111)
Creatinine, Ser: 0.57 mg/dL (ref 0.44–1.00)
GFR, Estimated: >60 mL/min
Glucose, Bld: 117 mg/dL — ABNORMAL HIGH (ref 70–99)
Potassium: 3.5 mmol/L (ref 3.5–5.1)
Sodium: 140 mmol/L (ref 135–145)

## 2021-02-21 LAB — RESP PANEL BY RT-PCR (FLU A&B, COVID) ARPGX2
Influenza A by PCR: NEGATIVE
Influenza B by PCR: NEGATIVE
SARS Coronavirus 2 by RT PCR: NEGATIVE

## 2021-02-21 LAB — CBC
HCT: 39.9 % (ref 36.0–46.0)
Hemoglobin: 13.6 g/dL (ref 12.0–15.0)
MCH: 30 pg (ref 26.0–34.0)
MCHC: 34.1 g/dL (ref 30.0–36.0)
MCV: 88.1 fL (ref 80.0–100.0)
Platelets: 229 10*3/uL (ref 150–400)
RBC: 4.53 MIL/uL (ref 3.87–5.11)
RDW: 11.8 % (ref 11.5–15.5)
WBC: 5.6 10*3/uL (ref 4.0–10.5)
nRBC: 0 % (ref 0.0–0.2)

## 2021-02-21 LAB — TROPONIN I (HIGH SENSITIVITY): Troponin I (High Sensitivity): 3 ng/L (ref <18)

## 2021-02-21 MED ORDER — BENZONATATE 100 MG PO CAPS: 100.0000 mg | ORAL_CAPSULE | Freq: Four times a day (QID) | ORAL | 0 refills | Status: AC | PRN

## 2021-02-21 MED ORDER — PREDNISONE 50 MG PO TABS: 50.0000 mg | ORAL_TABLET | Freq: Every day | ORAL | 0 refills | Status: DC

## 2021-02-21 NOTE — ED Provider Notes (Signed)
[facility] Emergency Department Provider Note   ____________________________________________    I have reviewed the triage vital signs and the nursing notes.   HISTORY  Chief Complaint Shortness of Breath and Chest Pain     HPI Andrea Ford is a 70 y.o. female with history as noted below who presents with complaints of cough.  Patient describes cough times approximately 2 weeks, she does report a history of COPD and thinks this may be exacerbating it.  She is also concerned that she may have contracted COVID but denies fevers or chills, no body aches.  No clear sick contacts reported.  Has not take anything for this.  Past Medical History:  Diagnosis Date   Asthma    COPD (chronic obstructive pulmonary disease) ([place])    Hypertension    Migraine     Patient Active Problem List   Diagnosis Date Noted   Fibromyalgia 07/12/2019   Age-related osteoporosis without current pathological fracture 06/29/2019   Irritable bowel syndrome with both constipation and diarrhea 03/16/2019   Small intestinal bacterial overgrowth 03/16/2019   Leg pain 12/21/2018   Varicose veins of both lower extremities with inflammation 11/30/2018   Microphthalmos, 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   Senile nuclear sclerosis, right 09/04/2018   Enrolled in chronic care management 07/14/2018   Nocturia 01/16/2018   Uterine leiomyoma 01/16/2018   Mixed stress and urge urinary incontinence 12/09/2017   Dysfunction of both eustachian tubes 09/12/2017   Severe episode of recurrent major depressive disorder, without psychotic features ([place]) 08/13/2016   Accelerated hypertension 07/13/2016   Chest pain 07/13/2016   Abnormal EKG 07/13/2016   GAD (generalized anxiety disorder) 07/13/2016   Migraine 03/22/2016   Obstructive airway disease ([place]) 11/02/2015   Chronic cough 05/03/2015   Former cigarette smoker 05/03/2015   Thickened endometrium 05/03/2015   Essential hypertension 02/06/2015    Anxiety with obsessional features 12/28/2012   Eczema 12/28/2012   Allergic rhinitis 03/30/2012   Asthma 12/17/2011   GERD without esophagitis 01/26/2003    Past Surgical History:  Procedure Laterality Date   ANKLE SURGERY Right    CARDIAC CATHETERIZATION Right 07/15/2016   Procedure: Left Heart Cath and Coronary Angiography;  Surgeon: Dionisio David, MD;  Location: [place] [facility];  Service: Cardiovascular;  Laterality: Right;   CHOLECYSTECTOMY     TUBAL LIGATION      Prior to Admission medications   Medication Sig Start Date End Date Taking? Authorizing Provider  benzonatate (TESSALON PERLES) 100 MG capsule Take 1 capsule (100 mg total) by mouth every 6 (six) hours as needed for cough. 02/21/21 02/21/22 Yes Lavonia Drafts, MD  predniSONE (DELTASONE) 50 MG tablet Take 1 tablet (50 mg total) by mouth daily with breakfast. 02/21/21  Yes Lavonia Drafts, MD  albuterol (PROVENTIL HFA;VENTOLIN HFA) 108 (90 BASE) MCG/ACT inhaler Inhale into the lungs every 6 (six) hours as needed for wheezing or shortness of breath.    [provider]  albuterol (PROVENTIL) (2.5 MG/3ML) 0.083% nebulizer solution Take 3 mLs (2.5 mg total) by nebulization every 6 (six) hours as needed for wheezing or shortness of breath. 06/25/18   Carrie Mew, MD  atenolol (TENORMIN) 25 MG tablet Take 25 mg by mouth daily. 02/03/20   [provider]  atorvastatin (LIPITOR) 40 MG tablet Take 1 tablet (40 mg total) by mouth daily at 6 PM. 07/15/16   Demetrios Loll, MD  busPIRone (BUSPAR) 10 MG tablet  02/28/19   [provider]  carvedilol (COREG) 6.25  MG tablet Take 6.25 mg by mouth at bedtime.    [provider]  Cholecalciferol (VITAMIN D-3 PO) Take 1 tablet by mouth daily.    [provider]  citalopram (CELEXA) 20 MG tablet Take 20 mg by mouth daily.  05/02/16   [provider]  clonazePAM (KLONOPIN) 0.5 MG tablet Take 0.25 mg by mouth 2 (two) times daily as needed for  anxiety.    [provider]  dicyclomine (BENTYL) 20 MG tablet Take 20 mg by mouth daily as needed for spasms.    [provider]  doxycycline (VIBRA-TABS) 100 MG tablet Take 100 mg by mouth 2 (two) times daily.    [provider]  estradiol (ESTRACE) 0.1 MG/GM vaginal cream INSERT 2 GRAMS INTO THE VAGINA DAILY FOR 1 TO 2 WEEKS THEN GRADUALLY REDUCE TO HALF THE INITIAL DOSE FOR 1 TO 2 WEEKS FOLLOWED BY A MAINTENA 08/02/19   [provider]  famotidine (PEPCID) 40 MG tablet Take 1 tablet (40 mg total) by mouth at bedtime. 03/01/19 02/29/20  Rudene Re, MD  furosemide (LASIX) 20 MG tablet Take 20 mg by mouth daily. 02/17/19   [provider]  hydrOXYzine (ATARAX/VISTARIL) 25 MG tablet Take 25 mg by mouth daily as needed. 05/02/16   [provider]  losartan (COZAAR) 100 MG tablet Take 100 mg by mouth daily. 02/01/16 07/26/19  [provider]  losartan (COZAAR) 50 MG tablet Take 50 mg by mouth daily. 08/11/19   [provider]  Multiple Vitamin (MULTIVITAMIN) capsule Take by mouth.    [provider]  rizatriptan (MAXALT-MLT) 10 MG disintegrating tablet Take 10 mg by mouth daily as needed. 12/04/17   [provider]  sodium chloride 0.9 % nebulizer solution SMARTSIG:1 Vial(s) Via Inhaler Every 6 Hours PRN 09/22/19   [provider]  traZODone (DESYREL) 50 MG tablet Take 50 mg by mouth at bedtime as needed for sleep.    [provider]     Allergies Prednisone, Diphenhydramine hcl, Other, Oxycodone-acetaminophen, Codeine, Diphenhydramine, Etodolac, Hydrocodone, Meperidine, and Venlafaxine  Family History  Problem Relation Age of Onset   Hypertension Mother    Migraines Mother    Cancer Mother    Varicose Veins Mother    Heart disease Father    Arthritis Father    Hypertension Father    Diabetes Maternal Uncle    Stroke Maternal Grandmother    Stroke Maternal Grandfather    Stroke  Paternal Grandmother    Stroke Paternal Grandfather     Social History Social History   Tobacco Use   Smoking status: Every Day    Pack years: 0.00   Smokeless tobacco: Never  Vaping Use   Vaping Use: Never used  Substance Use Topics   Alcohol use: No   Drug use: No    Review of Systems  Constitutional: No fever/chills Eyes: No visual changes.  ENT: No sore throat. Cardiovascular: Denies chest pain. Respiratory: Denies shortness of breath.  Positive cough Gastrointestinal: No abdominal pain.  No nausea, no vomiting.   Genitourinary: Negative for dysuria. Musculoskeletal: Negative for back pain. Skin: Negative for rash. Neurological: Negative for headaches or weakness   ____________________________________________   PHYSICAL EXAM:  VITAL SIGNS: ED Triage Vitals  Enc Vitals Group     BP 02/21/21 0717 (!) 151/80     Pulse Rate 02/21/21 0717 (!) 104     Resp 02/21/21 0717 17     Temp 02/21/21 0717 98.6 F (37 C)  Temp Source 02/21/21 0717 Oral     SpO2 02/21/21 0717 97 %     Weight --      Height --      Head Circumference --      Peak Flow --      Pain Score 02/21/21 0717 8     Pain Loc --      Pain Edu? --      Excl. in [place]? --     Constitutional: Alert and oriented. No acute distress. Pleasant and interactive  Nose: No congestion/rhinnorhea. Mouth/Throat: Mucous membranes are moist.    Cardiovascular: Normal rate, regular rhythm. Grossly normal heart sounds.  Good peripheral circulation. Respiratory: Normal respiratory effort.  No retractions. Lungs CTAB.  Reassuring exam  Musculoskeletal: No lower extremity tenderness nor edema.  Warm and well perfused Neurologic:  Normal speech and language. No gross focal neurologic deficits are appreciated.  Skin:  Skin is warm, dry and intact. No rash noted. Psychiatric: Mood and affect are normal. Speech and behavior are normal.  ____________________________________________   LABS (all labs ordered are  listed, but only abnormal results are displayed)  Labs Reviewed  BASIC METABOLIC PANEL - Abnormal; Notable for the following components:      Result Value   Glucose, Bld 117 (*)    All other components within normal limits  RESP PANEL BY RT-PCR (FLU A&B, COVID) ARPGX2  CBC  TROPONIN I (HIGH SENSITIVITY)   ____________________________________________  EKG  ED ECG REPORT I, Lavonia Drafts, the attending physician, personally viewed and interpreted this ECG.  Date: 02/21/2021  Rhythm: normal sinus rhythm QRS Axis: normal Intervals: normal ST/T Wave abnormalities: Nonspecific changes Narrative Interpretation: no evidence of acute ischemia  ____________________________________________  RADIOLOGY  Chest x-ray viewed by me, no acute abnormality ____________________________________________   PROCEDURES  Procedure(s) performed: No  Procedures   Critical Care performed: No ____________________________________________   INITIAL IMPRESSION / ASSESSMENT AND PLAN / ED COURSE  Pertinent labs & imaging results that were available during my care of the patient were reviewed by me and considered in my medical decision making (see chart for details).   Patient overall well-appearing and in no acute distress, exam is overall reassuring.  Suspect mild bronchitis versus COPD exacerbation.  Chest x-ray unremarkable, COVID test sent at her request.  Recommended treatment with prednisone, she reports that it makes it difficult for her to sleep however she is having difficulty sleeping now so she has agreed to try it.  Outpatient follow-up with PCP, return precautions if worsening.    ____________________________________________   FINAL CLINICAL IMPRESSION(S) / ED DIAGNOSES  Final diagnoses:  Bronchitis        Note:  This document was prepared using Dragon voice recognition software and may include unintentional dictation errors.    Lavonia Drafts, MD 02/21/21 [phone]

## 2021-02-21 NOTE — ED Triage Notes (Signed)
Pt comes wit hc/o SOB and CP for about 3 days. Pt states productive cough. Pt states she feels clamy. Pt states mid sternal CP with radiation to back.  Pt states no relief with inhalers.

## 2021-04-15 ENCOUNTER — Emergency Department
Admission: EM | Admit: 2021-04-15 | Discharge: 2021-04-15 | Disposition: A | Discharge status: Home / Self Care | Payer: Medicare Other | Attending: Emergency Medicine | Admitting: Emergency Medicine

## 2021-04-15 ENCOUNTER — Other Ambulatory Visit: Payer: Self-pay

## 2021-04-15 ENCOUNTER — Emergency Department: Payer: Medicare Other

## 2021-04-15 DIAGNOSIS — I10 Essential (primary) hypertension: Secondary | ICD-10-CM | POA: Insufficient documentation

## 2021-04-15 DIAGNOSIS — J449 Chronic obstructive pulmonary disease, unspecified: Secondary | ICD-10-CM | POA: Insufficient documentation

## 2021-04-15 DIAGNOSIS — J45909 Unspecified asthma, uncomplicated: Secondary | ICD-10-CM | POA: Diagnosis not present

## 2021-04-15 DIAGNOSIS — L03116 Cellulitis of left lower limb: Secondary | ICD-10-CM | POA: Diagnosis not present

## 2021-04-15 DIAGNOSIS — Z79899 Other long term (current) drug therapy: Secondary | ICD-10-CM | POA: Diagnosis not present

## 2021-04-15 DIAGNOSIS — M79605 Pain in left leg: Secondary | ICD-10-CM | POA: Diagnosis present

## 2021-04-15 DIAGNOSIS — F17200 Nicotine dependence, unspecified, uncomplicated: Secondary | ICD-10-CM | POA: Diagnosis not present

## 2021-04-15 LAB — CBC WITH DIFFERENTIAL/PLATELET
Abs Immature Granulocytes: 0.02 10*3/uL (ref 0.00–0.07)
Basophils Absolute: 0.1 10*3/uL (ref 0.0–0.1)
Basophils Relative: 1 %
Eosinophils Absolute: 0.2 10*3/uL (ref 0.0–0.5)
Eosinophils Relative: 5 %
HCT: 42.6 % (ref 36.0–46.0)
Hemoglobin: 14.6 g/dL (ref 12.0–15.0)
Immature Granulocytes: 0 %
Lymphocytes Relative: 43 %
Lymphs Abs: 2.2 10*3/uL (ref 0.7–4.0)
MCH: 29.5 pg (ref 26.0–34.0)
MCHC: 34.3 g/dL (ref 30.0–36.0)
MCV: 86.1 fL (ref 80.0–100.0)
Monocytes Absolute: 0.4 10*3/uL (ref 0.1–1.0)
Monocytes Relative: 7 %
Neutro Abs: 2.3 10*3/uL (ref 1.7–7.7)
Neutrophils Relative %: 44 %
Platelets: 224 10*3/uL (ref 150–400)
RBC: 4.95 MIL/uL (ref 3.87–5.11)
RDW: 11.8 % (ref 11.5–15.5)
WBC: 5.1 10*3/uL (ref 4.0–10.5)
nRBC: 0 % (ref 0.0–0.2)

## 2021-04-15 LAB — COMPREHENSIVE METABOLIC PANEL
ALT: 17 U/L (ref 0–44)
AST: 25 U/L (ref 15–41)
Albumin: 4 g/dL (ref 3.5–5.0)
Alkaline Phosphatase: 64 U/L (ref 38–126)
Anion gap: 7 (ref 5–15)
BUN: 13 mg/dL (ref 8–23)
CO2: 27 mmol/L (ref 22–32)
Calcium: 9 mg/dL (ref 8.9–10.3)
Chloride: 105 mmol/L (ref 98–111)
Creatinine, Ser: 0.81 mg/dL (ref 0.44–1.00)
GFR, Estimated: >60 mL/min (ref >60)
Glucose, Bld: 106 mg/dL — ABNORMAL HIGH (ref 70–99)
Potassium: 3.8 mmol/L (ref 3.5–5.1)
Sodium: 139 mmol/L (ref 135–145)
Total Bilirubin: 0.7 mg/dL (ref 0.3–1.2)
Total Protein: 6.9 g/dL (ref 6.5–8.1)

## 2021-04-15 MED ORDER — CEPHALEXIN 500 MG PO CAPS: 500.0000 mg | ORAL_CAPSULE | Freq: Four times a day (QID) | ORAL | 0 refills | Status: AC

## 2021-04-15 NOTE — ED Provider Notes (Signed)
[facility] Emergency Department Provider Note   ____________________________________________   Event Date/Time   First MD Initiated Contact with Patient 04/15/21 [phone]     (approximate)  I have reviewed the triage vital signs and the nursing notes.   HISTORY  Chief Complaint Not Feeling Well    HPI Andrea Ford is a 70 y.o. female with past medical history of hypertension, hyperlipidemia, COPD, GERD, generalized anxiety disorder, and fibromyalgia who presents to the ED complaining of leg pain and swelling.  Patient reports that a few hours prior to arrival last night she started to notice that the area behind her left knee was red and swollen.  She states that the area feels hot to touch and is very tender, she denies any recent trauma to the area and has not had any drainage.  She denies any fevers, cough, chest pain, or shortness of breath.  She does report history of varicose veins and Baker's cyst, denies history of DVT.  She states that she was feeling malaised last night and was concerned about her blood pressure being high as well.  She denies any vision changes, speech changes, numbness, or weakness.        Past Medical History:  Diagnosis Date   Asthma    COPD (chronic obstructive pulmonary disease) ([place])    Hypertension    Migraine     Patient Active Problem List   Diagnosis Date Noted   Fibromyalgia 07/12/2019   Age-related osteoporosis without current pathological fracture 06/29/2019   Irritable bowel syndrome with both constipation and diarrhea 03/16/2019   Small intestinal bacterial overgrowth 03/16/2019   Leg pain 12/21/2018   Varicose veins of both lower extremities with inflammation 11/30/2018   Microphthalmos, 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   Senile nuclear sclerosis, right 09/04/2018   Enrolled in chronic care management 07/14/2018   Nocturia 01/16/2018   Uterine leiomyoma 01/16/2018   Mixed stress and urge urinary incontinence  12/09/2017   Dysfunction of both eustachian tubes 09/12/2017   Severe episode of recurrent major depressive disorder, without psychotic features ([place]) 08/13/2016   Accelerated hypertension 07/13/2016   Chest pain 07/13/2016   Abnormal EKG 07/13/2016   GAD (generalized anxiety disorder) 07/13/2016   Migraine 03/22/2016   Obstructive airway disease ([place]) 11/02/2015   Chronic cough 05/03/2015   Former cigarette smoker 05/03/2015   Thickened endometrium 05/03/2015   Essential hypertension 02/06/2015   Anxiety with obsessional features 12/28/2012   Eczema 12/28/2012   Allergic rhinitis 03/30/2012   Asthma 12/17/2011   GERD without esophagitis 01/26/2003    Past Surgical History:  Procedure Laterality Date   ANKLE SURGERY Right    CARDIAC CATHETERIZATION Right 07/15/2016   Procedure: Left Heart Cath and Coronary Angiography;  Surgeon: Dionisio David, MD;  Location: [facility];  Service: Cardiovascular;  Laterality: Right;   CHOLECYSTECTOMY     TUBAL LIGATION      Prior to Admission medications   Medication Sig Start Date End Date Taking? Authorizing Provider  cephALEXin (KEFLEX) 500 MG capsule Take 1 capsule (500 mg total) by mouth 4 (four) times daily for 7 days. 04/15/21 04/22/21 Yes Blake Divine, MD  albuterol (PROVENTIL HFA;VENTOLIN HFA) 108 (90 BASE) MCG/ACT inhaler Inhale into the lungs every 6 (six) hours as needed for wheezing or shortness of breath.    [provider]  albuterol (PROVENTIL) (2.5 MG/3ML) 0.083% nebulizer solution Take 3 mLs (2.5 mg total) by nebulization every 6 (six) hours as needed for wheezing or shortness  of breath. 06/25/18   Carrie Mew, MD  atenolol (TENORMIN) 25 MG tablet Take 25 mg by mouth daily. 02/03/20   [provider]  atorvastatin (LIPITOR) 40 MG tablet Take 1 tablet (40 mg total) by mouth daily at 6 PM. 07/15/16   Demetrios Loll, MD  benzonatate (TESSALON PERLES) 100 MG capsule Take 1 capsule (100 mg total) by mouth  every 6 (six) hours as needed for cough. 02/21/21 02/21/22  Lavonia Drafts, MD  busPIRone (BUSPAR) 10 MG tablet  02/28/19   [provider]  carvedilol (COREG) 6.25 MG tablet Take 6.25 mg by mouth at bedtime.    [provider]  Cholecalciferol (VITAMIN D-3 PO) Take 1 tablet by mouth daily.    [provider]  citalopram (CELEXA) 20 MG tablet Take 20 mg by mouth daily.  05/02/16   [provider]  clonazePAM (KLONOPIN) 0.5 MG tablet Take 0.25 mg by mouth 2 (two) times daily as needed for anxiety.    [provider]  dicyclomine (BENTYL) 20 MG tablet Take 20 mg by mouth daily as needed for spasms.    [provider]  doxycycline (VIBRA-TABS) 100 MG tablet Take 100 mg by mouth 2 (two) times daily.    [provider]  estradiol (ESTRACE) 0.1 MG/GM vaginal cream INSERT 2 GRAMS INTO THE VAGINA DAILY FOR 1 TO 2 WEEKS THEN GRADUALLY REDUCE TO HALF THE INITIAL DOSE FOR 1 TO 2 WEEKS FOLLOWED BY A MAINTENA 08/02/19   [provider]  famotidine (PEPCID) 40 MG tablet Take 1 tablet (40 mg total) by mouth at bedtime. 03/01/19 02/29/20  Rudene Re, MD  furosemide (LASIX) 20 MG tablet Take 20 mg by mouth daily. 02/17/19   [provider]  hydrOXYzine (ATARAX/VISTARIL) 25 MG tablet Take 25 mg by mouth daily as needed. 05/02/16   [provider]  losartan (COZAAR) 100 MG tablet Take 100 mg by mouth daily. 02/01/16 07/26/19  [provider]  losartan (COZAAR) 50 MG tablet Take 50 mg by mouth daily. 08/11/19   [provider]  Multiple Vitamin (MULTIVITAMIN) capsule Take by mouth.    [provider]  predniSONE (DELTASONE) 50 MG tablet Take 1 tablet (50 mg total) by mouth daily with breakfast. 02/21/21   Lavonia Drafts, MD  rizatriptan (MAXALT-MLT) 10 MG disintegrating tablet Take 10 mg by mouth daily as needed. 12/04/17   [provider]  sodium chloride 0.9 % nebulizer solution SMARTSIG:1 Vial(s)  Via Inhaler Every 6 Hours PRN 09/22/19   [provider]  traZODone (DESYREL) 50 MG tablet Take 50 mg by mouth at bedtime as needed for sleep.    [provider]    Allergies Prednisone, Diphenhydramine hcl, Other, Oxycodone-acetaminophen, Codeine, Diphenhydramine, Etodolac, Hydrocodone, Meperidine, and Venlafaxine  Family History  Problem Relation Age of Onset   Hypertension Mother    Migraines Mother    Cancer Mother    Varicose Veins Mother    Heart disease Father    Arthritis Father    Hypertension Father    Diabetes Maternal Uncle    Stroke Maternal Grandmother    Stroke Maternal Grandfather    Stroke Paternal Grandmother    Stroke Paternal Grandfather     Social History Social History   Tobacco Use   Smoking status: Every Day   Smokeless tobacco: Never  Vaping Use   Vaping Use: Never used  Substance Use Topics   Alcohol use: No   Drug use: No    Review of Systems  Constitutional: No fever/chills.  Positive for malaise. Eyes: No visual changes. ENT: No sore throat. Cardiovascular: Denies chest pain. Respiratory: Denies shortness of breath. Gastrointestinal: No abdominal pain.  No nausea, no vomiting.  No diarrhea.  No constipation. Genitourinary: Negative for dysuria. Musculoskeletal: Negative for back pain.  Positive for left leg pain and swelling. Skin: Negative for rash. Neurological: Negative for headaches, focal weakness or numbness.  ____________________________________________   PHYSICAL EXAM:  VITAL SIGNS: ED Triage Vitals  Enc Vitals Group     BP 04/15/21 0406 (!) 186/95     Pulse Rate 04/15/21 0406 90     Resp 04/15/21 0406 18     Temp 04/15/21 0406 98.3 F (36.8 C)     Temp Source 04/15/21 0406 Oral     SpO2 04/15/21 0406 100 %     Weight 04/15/21 0404 150 lb (68 kg)     Height 04/15/21 0404 5' 1.5" (1.562 m)     Head Circumference --      Peak Flow --      Pain Score --      Pain Loc --      Pain Edu? --       Excl. in [place]? --     Constitutional: Alert and oriented. Eyes: Conjunctivae are normal. Head: Atraumatic. Nose: No congestion/rhinnorhea. Mouth/Throat: Mucous membranes are moist. Neck: Normal ROM Cardiovascular: Normal rate, regular rhythm. Grossly normal heart sounds. Respiratory: Normal respiratory effort.  No retractions. Lungs CTAB. Gastrointestinal: Soft and nontender. No distention. Genitourinary: deferred Musculoskeletal: Erythema, edema, warmth, and tenderness to palpation noted to left posterior knee with no purulence or fluctuance. Neurologic:  Normal speech and language. No gross focal neurologic deficits are appreciated. Skin:  Skin is warm, dry and intact. No rash noted. Psychiatric: Mood and affect are normal. Speech and behavior are normal.  ____________________________________________   LABS (all labs ordered are listed, but only abnormal results are displayed)  Labs Reviewed  COMPREHENSIVE METABOLIC PANEL - Abnormal; Notable for the following components:      Result Value   Glucose, Bld 106 (*)    All other components within normal limits  CBC WITH DIFFERENTIAL/PLATELET   ____________________________________________  EKG  ED ECG REPORT I, Blake Divine, the attending physician, personally viewed and interpreted this ECG.   Date: 04/15/2021  EKG Time: 7:58  Rate: 64  Rhythm: normal sinus rhythm  Axis: Normal  Intervals:none  ST&T Change: None   PROCEDURES  Procedure(s) performed (including Critical Care):  Procedures   ____________________________________________   INITIAL IMPRESSION / ASSESSMENT AND PLAN / ED COURSE      70 year old female with past medical history of hypertension, hyperlipidemia, COPD, GERD, generalized anxiety disorder, and fibromyalgia presents to the ED with pain and swelling behind her left knee for about the past 12 hours.  Symptoms seem most concerning for a cellulitis, no evidence of abscess.  Patient is also  concerned about the possibility of DVT and we will further assess with ultrasound.  Labs and vital signs are reassuring, not consistent with sepsis.  She also describes generalized malaise and is concerned about her BP, we will screen EKG but no evidence of hypertensive emergency at this time.  EKG shows no evidence of arrhythmia or ischemia.  Ultrasound is negative for DVT or other acute process.  Patient may be dealing with some venous stasis dermatitis versus cellulitis and we will cover with Keflex.  She is appropriate for discharge home with PCP follow-up, was counseled to return to the ED  for new worsening symptoms.  Patient agrees with plan.      ____________________________________________   FINAL CLINICAL IMPRESSION(S) / ED DIAGNOSES  Final diagnoses:  Left leg cellulitis     ED Discharge Orders          Ordered    cephALEXin (KEFLEX) 500 MG capsule  4 times daily        04/15/21 0933             Note:  This document was prepared using Dragon voice recognition software and may include unintentional dictation errors.    Blake Divine, MD 04/15/21 [phone]

## 2021-04-15 NOTE — ED Triage Notes (Signed)
Patient reports after she had laid down for the evening she just started to feel bad.  Then noticed area behind left knee was red and whelped up.  Patient reports she took her blood pressure at home and it was elevated.

## 2021-05-01 ENCOUNTER — Telehealth (INDEPENDENT_AMBULATORY_CARE_PROVIDER_SITE_OTHER): Payer: Self-pay | Admitting: Vascular Surgery

## 2021-05-01 NOTE — Telephone Encounter (Signed)
Made patient aware of NP's note. Called and scheduled patient

## 2021-05-01 NOTE — Telephone Encounter (Signed)
The ED didn't do reflux study, they did a DVT study( they don't do reflux studies at the hospital, they only do DVT studies).  The patient should be wearing compression socks as we have previously discussed with her.  Antibiotics will not cure her swelling.  Only compression will.  So if the patient is not wearing compression socks and elevating her legs, she will continue to have redness and swelling.  This is what we will advise her of at her visit.  She can be seen with bilateral reflux studies, me or gs schedule permitting.  She should also continue to follow up with PCP

## 2021-05-01 NOTE — Telephone Encounter (Signed)
Called stating that she was seen at the Ed 3weeks ago and was diagnosed with Cellulitis. Patient followed up with her PCP as d/c summary stated and was put on antibiotics. Patient states she has been taking them as directed and legs still are very swollen with pain and area that was infected has is not healed. ED did bil ven reflux study at time of visit. Patient would like to come in to be seen. Patient was last seen 01/2020 with GS (add on per phone note.) Please advise.

## 2021-05-07 ENCOUNTER — Other Ambulatory Visit (INDEPENDENT_AMBULATORY_CARE_PROVIDER_SITE_OTHER): Payer: Self-pay | Admitting: Nurse Practitioner

## 2021-05-07 DIAGNOSIS — L03119 Cellulitis of unspecified part of limb: Secondary | ICD-10-CM

## 2021-05-07 DIAGNOSIS — M79606 Pain in leg, unspecified: Secondary | ICD-10-CM

## 2021-05-09 ENCOUNTER — Encounter (INDEPENDENT_AMBULATORY_CARE_PROVIDER_SITE_OTHER): Payer: Self-pay | Admitting: Nurse Practitioner

## 2021-05-09 ENCOUNTER — Ambulatory Visit (INDEPENDENT_AMBULATORY_CARE_PROVIDER_SITE_OTHER): Payer: Medicare Other | Admitting: Nurse Practitioner

## 2021-05-09 ENCOUNTER — Other Ambulatory Visit: Payer: Self-pay

## 2021-05-09 ENCOUNTER — Ambulatory Visit (INDEPENDENT_AMBULATORY_CARE_PROVIDER_SITE_OTHER): Payer: Medicare Other

## 2021-05-09 VITALS — BP 159/83 | HR 57 | Ht 61.0 in | Wt 146.0 lb

## 2021-05-09 DIAGNOSIS — L03119 Cellulitis of unspecified part of limb: Secondary | ICD-10-CM | POA: Diagnosis not present

## 2021-05-09 DIAGNOSIS — I10 Essential (primary) hypertension: Secondary | ICD-10-CM

## 2021-05-09 DIAGNOSIS — I8311 Varicose veins of right lower extremity with inflammation: Secondary | ICD-10-CM

## 2021-05-09 DIAGNOSIS — M79606 Pain in leg, unspecified: Secondary | ICD-10-CM

## 2021-05-09 DIAGNOSIS — M7989 Other specified soft tissue disorders: Secondary | ICD-10-CM | POA: Diagnosis not present

## 2021-05-09 DIAGNOSIS — I8312 Varicose veins of left lower extremity with inflammation: Secondary | ICD-10-CM

## 2021-05-09 DIAGNOSIS — M797 Fibromyalgia: Secondary | ICD-10-CM | POA: Diagnosis not present

## 2021-05-20 ENCOUNTER — Encounter (INDEPENDENT_AMBULATORY_CARE_PROVIDER_SITE_OTHER): Payer: Self-pay | Admitting: Nurse Practitioner

## 2021-05-20 NOTE — Progress Notes (Signed)
Subjective:    Patient ID: Andrea Ford, female    DOB: 09-03-1950, 70 y.o.   MRN: [id number] Chief Complaint  Patient presents with   Follow-up    Est pt is 06/21 phone note in epic BLE ven reflux     Andrea Ford is a 69 year old female that returns to the office for follow-up evaluation after having cellulitis.  The patient notes that she has continued leg swelling following cellulitis.  She also notes pain bilaterally.  She notes the same tenderness in her lower extremities before she has sclerotherapy of her varicosities.  The patient continues to wear graduated compression stockings on a daily basis but these are not eliminating the pain and discomfort. The patient continues to use over-the-counter anti-inflammatory medications to treat the pain and related symptoms but this has not given the patient relief. The patient notes the pain in the lower extremities is causing problems with daily exercise, problems at work and even with household activities such as preparing meals and doing dishes.  The patient is otherwise done well and there have been no complications related to the laser procedure or interval changes in the patient's overall   Noninvasive studies today show the bilateral great saphenous veins remain ablated.  The left lower extremity has reflux in the common femoral vein.  There is also some reflux in the great saphenous vein at the saphenofemoral junction in the knee     Review of Systems  Psychiatric/Behavioral:  The patient is nervous/anxious.   All other systems reviewed and are negative.     Objective:   Physical Exam Vitals reviewed.  HENT:     Head: Normocephalic.  Cardiovascular:     Rate and Rhythm: Normal rate.  Pulmonary:     Effort: Pulmonary effort is normal.  Musculoskeletal:        General: Tenderness present.  Skin:    General: Skin is warm and dry.  Neurological:     Mental Status: She is alert and oriented to person, place, and time.   Psychiatric:        Mood and Affect: Mood normal.        Behavior: Behavior normal.        Thought Content: Thought content normal.        Judgment: Judgment normal.    BP (!) 159/83   Pulse (!) 57   Ht 5\' 1"  (1.549 m)   Wt 146 lb (66.2 kg)   BMI 27.59 kg/m   Past Medical History:  Diagnosis Date   Asthma    COPD (chronic obstructive pulmonary disease) ([facility])    Hypertension    Migraine     Social History   Socioeconomic History   Marital status: Divorced    Spouse name: Not on file   Number of children: Not on file   Years of education: Not on file   Highest education level: Not on file  Occupational History   Not on file  Tobacco Use   Smoking status: Every Day   Smokeless tobacco: Never  Vaping Use   Vaping Use: Never used  Substance and Sexual Activity   Alcohol use: No   Drug use: No   Sexual activity: Not on file  Other Topics Concern   Not on file  Social History Narrative   Not on file   Social Determinants of Health   Financial Resource Strain: Not on file  Food Insecurity: Not on file  Transportation Needs: Not on file  Physical Activity:  Not on file  Stress: Not on file  Social Connections: Not on file  Intimate Partner Violence: Not on file    Past Surgical History:  Procedure Laterality Date   ANKLE SURGERY Right    CARDIAC CATHETERIZATION Right 07/15/2016   Procedure: Left Heart Cath and Coronary Angiography;  Surgeon: Dionisio David, MD;  Location: [facility];  Service: Cardiovascular;  Laterality: Right;   CHOLECYSTECTOMY     TUBAL LIGATION      Family History  Problem Relation Age of Onset   Hypertension Mother    Migraines Mother    Cancer Mother    Varicose Veins Mother    Heart disease Father    Arthritis Father    Hypertension Father    Diabetes Maternal Uncle    Stroke Maternal Grandmother    Stroke Maternal Grandfather    Stroke Paternal Grandmother    Stroke Paternal Grandfather     Allergies   Allergen Reactions   Prednisone Rash and Other (See Comments)    hyper    Diphenhydramine Hcl Other (See Comments)   Metoprolol     Other reaction(s): Hives ([id number]) Other reaction(s): Hives ([id number])    Other Other (See Comments)    Pet dander   Oxycodone-Acetaminophen    Codeine Itching and Other (See Comments)   Diphenhydramine Anxiety   Etodolac Rash, Nausea And Vomiting and Other (See Comments)   Hydrocodone Itching   Meperidine Rash and Other (See Comments)    Pt. Not aware  Of ever having any demerol    Venlafaxine Rash and Other (See Comments)    CBC Latest Ref Rng & Units 04/15/2021 02/21/2021 01/16/2021  WBC 4.0 - 10.5 K/uL 5.1 5.6 4.8  Hemoglobin 12.0 - 15.0 g/dL 14.6 13.6 14.3  Hematocrit 36.0 - 46.0 % 42.6 39.9 41.8  Platelets 150 - 400 K/uL 224 229 226      CMP     Component Value Date/Time   NA 139 04/15/2021 0414   NA 141 11/02/2014 0937   K 3.8 04/15/2021 0414   K 4.0 11/02/2014 0937   CL 105 04/15/2021 0414   CL 107 11/02/2014 0937   CO2 27 04/15/2021 0414   CO2 26 11/02/2014 0937   GLUCOSE 106 (H) 04/15/2021 0414   GLUCOSE 124 (H) 11/02/2014 0937   BUN 13 04/15/2021 0414   BUN 16 11/02/2014 0937   CREATININE 0.81 04/15/2021 0414   CREATININE 0.70 11/02/2014 0937   CALCIUM 9.0 04/15/2021 0414   CALCIUM 9.2 11/02/2014 0937   PROT 6.9 04/15/2021 0414   PROT 7.7 09/14/2014 1104   ALBUMIN 4.0 04/15/2021 0414   ALBUMIN 3.8 09/14/2014 1104   AST 25 04/15/2021 0414   AST 28 09/14/2014 1104   ALT 17 04/15/2021 0414   ALT 32 09/14/2014 1104   ALKPHOS 64 04/15/2021 0414   ALKPHOS 72 09/14/2014 1104   BILITOT 0.7 04/15/2021 0414   BILITOT 0.5 09/14/2014 1104   GFRNONAA >60 04/15/2021 0414   GFRNONAA >60 11/02/2014 0937   GFRAA >60 01/30/2020 1351   GFRAA >60 11/02/2014 0937     No results found.     Assessment & Plan:   1. Varicose veins of both lower extremities with inflammation Recommend:  The patient has had successful  treatment of previous varicosities.  However she continues to have symptoms of pain tenderness and discomfort noted bilaterally.  Patient should undergo injection sclerotherapy to treat the residual varicosities.  The risks, benefits and alternative therapies were reviewed  in detail with the patient.  All questions were answered.  The patient agrees to proceed with sclerotherapy at their convenience.  The patient will continue wearing the graduated compression stockings and using the over-the-counter pain medications to treat her symptoms.       2. Fibromyalgia It is possible that the patient's pain and discomfort is related to her fibromyalgia.  Discussed with patient that following sclerotherapy of the patient's.  It is likely related to her fibromyalgia she should follow-up with her PCP for further work-up and evaluation.  3. Essential hypertension Continue antihypertensive medications as already ordered, these medications have been reviewed and there are no changes at this time.    Current Outpatient Medications on File Prior to Visit  Medication Sig Dispense Refill   acetaminophen (TYLENOL) 650 MG CR tablet Take by mouth.     albuterol (PROVENTIL HFA;VENTOLIN HFA) 108 (90 BASE) MCG/ACT inhaler Inhale into the lungs every 6 (six) hours as needed for wheezing or shortness of breath.     albuterol (PROVENTIL) (2.5 MG/3ML) 0.083% nebulizer solution Take 3 mLs (2.5 mg total) by nebulization every 6 (six) hours as needed for wheezing or shortness of breath. 75 mL 0   atenolol (TENORMIN) 25 MG tablet Take 25 mg by mouth daily.     atorvastatin (LIPITOR) 40 MG tablet Take 1 tablet (40 mg total) by mouth daily at 6 PM. 30 tablet 2   atorvastatin (LIPITOR) 80 MG tablet Take 1 tablet by mouth daily.     benzonatate (TESSALON PERLES) 100 MG capsule Take 1 capsule (100 mg total) by mouth every 6 (six) hours as needed for cough. 30 capsule 0   busPIRone (BUSPAR) 10 MG tablet      busPIRone (BUSPAR)  10 MG tablet Take by mouth.     Cholecalciferol (VITAMIN D-3 PO) Take 1 tablet by mouth daily.     citalopram (CELEXA) 20 MG tablet Take 20 mg by mouth daily.      clonazePAM (KLONOPIN) 0.5 MG tablet Take 0.25 mg by mouth 2 (two) times daily as needed for anxiety.     diclofenac Sodium (VOLTAREN) 1 % GEL Apply topically.     dicyclomine (BENTYL) 20 MG tablet Take 20 mg by mouth daily as needed for spasms.     doxycycline (VIBRA-TABS) 100 MG tablet Take 100 mg by mouth 2 (two) times daily.     ergocalciferol (VITAMIN D2) 1.25 MG (50000 UT) capsule TAKE 1 CAPSULE BY MOUTH EVERY 14 DAYS     estradiol (ESTRACE) 0.1 MG/GM vaginal cream INSERT 2 GRAMS INTO THE VAGINA DAILY FOR 1 TO 2 WEEKS THEN GRADUALLY REDUCE TO HALF THE INITIAL DOSE FOR 1 TO 2 WEEKS FOLLOWED BY A MAINTENA     famotidine (PEPCID) 20 MG tablet Take by mouth.     fluticasone (FLONASE) 50 MCG/ACT nasal spray 1 spray into each nostril daily as needed for allergies.     furosemide (LASIX) 20 MG tablet Take 20 mg by mouth daily.     hydrOXYzine (ATARAX/VISTARIL) 25 MG tablet Take 25 mg by mouth daily as needed.     losartan (COZAAR) 100 MG tablet Take 1 tablet by mouth daily.     losartan (COZAAR) 50 MG tablet Take 50 mg by mouth daily.     Multiple Vitamin (MULTIVITAMIN) capsule Take by mouth.     predniSONE (DELTASONE) 50 MG tablet Take 1 tablet (50 mg total) by mouth daily with breakfast. 4 tablet 0   rizatriptan (MAXALT-MLT) 10 MG disintegrating  tablet Take 10 mg by mouth daily as needed.     sodium chloride 0.9 % nebulizer solution SMARTSIG:1 Vial(s) Via Inhaler Every 6 Hours PRN     traZODone (DESYREL) 50 MG tablet Take 50 mg by mouth at bedtime as needed for sleep.     famotidine (PEPCID) 40 MG tablet Take 1 tablet (40 mg total) by mouth at bedtime. 30 tablet 1   losartan (COZAAR) 100 MG tablet Take 100 mg by mouth daily.     No current facility-administered medications on file prior to visit.    There are no Patient  Instructions on file for this visit. No follow-ups on file.   Kris Hartmann, NP

## 2021-09-10 ENCOUNTER — Emergency Department
Admission: EM | Admit: 2021-09-10 | Discharge: 2021-09-10 | Disposition: A | Discharge status: Home / Self Care | Payer: Medicare Other | Attending: Emergency Medicine | Admitting: Emergency Medicine

## 2021-09-10 ENCOUNTER — Encounter: Payer: Self-pay | Admitting: Emergency Medicine

## 2021-09-10 ENCOUNTER — Other Ambulatory Visit: Payer: Self-pay

## 2021-09-10 ENCOUNTER — Emergency Department: Payer: Medicare Other

## 2021-09-10 DIAGNOSIS — W230XXA Caught, crushed, jammed, or pinched between moving objects, initial encounter: Secondary | ICD-10-CM | POA: Insufficient documentation

## 2021-09-10 DIAGNOSIS — S6991XA Unspecified injury of right wrist, hand and finger(s), initial encounter: Secondary | ICD-10-CM | POA: Diagnosis present

## 2021-09-10 DIAGNOSIS — M79644 Pain in right finger(s): Secondary | ICD-10-CM | POA: Insufficient documentation

## 2021-09-10 DIAGNOSIS — S6701XA Crushing injury of right thumb, initial encounter: Secondary | ICD-10-CM

## 2021-09-10 NOTE — ED Triage Notes (Signed)
Pt presents via POV with c/o right thumb finger injury. Pt's finger was accidentally caught in car window. Pt cannot move extremity without pain. Redness noted to thumb.

## 2021-09-10 NOTE — Discharge Instructions (Addendum)
Take tylenol or ibuprofen for pain. Use ice off and on throughout the day. See primary care if not improving over the week.

## 2021-09-10 NOTE — ED Notes (Signed)
RN splinted pt right thumb with a finger splint that is wrapped in coban. PMS noted prior and after splint application.

## 2021-09-10 NOTE — ED Provider Notes (Signed)
[facility] Provider Note    Event Date/Time   First MD Initiated Contact with Patient 09/10/21 2127     (approximate)   History   Finger Injury   HPI  Andrea Ford is a 71 y.o. female with history of osteoarthritis and as listed in EMR presents to the emergency department for treatment evaluation after her right thumb got caught in the car window as it was being rolled up.  Patient states that her daughter accidentally closed the window when she had her hand at the edge of the window to throw her cigarette out.  No alleviating measures attempted prior to arrival.       Physical Exam   Triage Vital Signs: ED Triage Vitals  Enc Vitals Group     BP 09/10/21 2005 (!) 145/99     Pulse Rate 09/10/21 2005 98     Resp 09/10/21 2005 17     Temp 09/10/21 2005 98.2 F (36.8 C)     Temp Source 09/10/21 2005 Oral     SpO2 09/10/21 2005 98 %     Weight --      Height 09/10/21 2006 5\' 1"  (1.549 m)     Head Circumference --      Peak Flow --      Pain Score 09/10/21 2005 5     Pain Loc --      Pain Edu? --      Excl. in [place]? --     Most recent vital signs: Vitals:   09/10/21 2005  BP: (!) 145/99  Pulse: 98  Resp: 17  Temp: 98.2 F (36.8 C)  SpO2: 98%    General: Awake, no distress.  CV:  Good peripheral perfusion.  Resp:  Normal effort.  Abd:  No distention.  Other:  Patient able to demonstrate limited range of motion of the right thumb secondary to pain.  There is no obvious deformity.  There is no open wound overlying the right thumb or the right hand.   ED Results / Procedures / Treatments   Labs (all labs ordered are listed, but only abnormal results are displayed) Labs Reviewed - No data to display   EKG     RADIOLOGY  Image and radiology report reviewed by me.  Image of the right thumb negative for acute bony abnormality.  PROCEDURES:  Critical Care performed: No  Procedures   MEDICATIONS ORDERED IN  ED: Medications - No data to display   IMPRESSION / MDM / [place] / ED COURSE  I reviewed the triage vital signs and the nursing notes.                              Differential diagnosis includes, but is not limited to, crush injury, fracture  71 year old female presenting to the emergency department for treatment and evaluation after her thumb was caught between the car window and the door frame.  See HPI for further details.  Exam is reassuring.  X-ray is negative for acute bony abnormality.  Patient does have pain with attempt of range of motion.  Will apply in an aluminum foam splint and advised to rest, ice, elevate, and take Tylenol.  If she is not feeling better over the week, she is to follow-up with her primary care provider.      FINAL CLINICAL IMPRESSION(S) / ED DIAGNOSES   Final diagnoses:  Crushing injury of right thumb,  initial encounter     Rx / DC Orders   ED Discharge Orders     None        Note:  This document was prepared using Dragon voice recognition software and may include unintentional dictation errors.   Victorino Dike, FNP 09/10/21 2318    Blake Divine, MD 09/10/21 [phone]

## 2022-01-06 ENCOUNTER — Other Ambulatory Visit: Payer: Self-pay

## 2022-01-06 ENCOUNTER — Emergency Department
Admission: EM | Admit: 2022-01-06 | Discharge: 2022-01-06 | Disposition: A | Discharge status: Home / Self Care | Payer: Medicare Other | Attending: Emergency Medicine | Admitting: Emergency Medicine

## 2022-01-06 ENCOUNTER — Emergency Department: Payer: Medicare Other

## 2022-01-06 DIAGNOSIS — Z87891 Personal history of nicotine dependence: Secondary | ICD-10-CM | POA: Insufficient documentation

## 2022-01-06 DIAGNOSIS — I10 Essential (primary) hypertension: Secondary | ICD-10-CM | POA: Insufficient documentation

## 2022-01-06 DIAGNOSIS — J45909 Unspecified asthma, uncomplicated: Secondary | ICD-10-CM | POA: Insufficient documentation

## 2022-01-06 DIAGNOSIS — J101 Influenza due to other identified influenza virus with other respiratory manifestations: Secondary | ICD-10-CM | POA: Insufficient documentation

## 2022-01-06 DIAGNOSIS — R0789 Other chest pain: Secondary | ICD-10-CM | POA: Diagnosis present

## 2022-01-06 DIAGNOSIS — R000 Tachycardia, unspecified: Secondary | ICD-10-CM | POA: Diagnosis not present

## 2022-01-06 DIAGNOSIS — J449 Chronic obstructive pulmonary disease, unspecified: Secondary | ICD-10-CM | POA: Insufficient documentation

## 2022-01-06 LAB — BASIC METABOLIC PANEL
Anion gap: 7 (ref 5–15)
BUN: 12 mg/dL (ref 8–23)
CO2: 27 mmol/L (ref 22–32)
Calcium: 8.5 mg/dL — ABNORMAL LOW (ref 8.9–10.3)
Chloride: 105 mmol/L (ref 98–111)
Creatinine, Ser: 0.73 mg/dL (ref 0.44–1.00)
GFR, Estimated: >60 mL/min (ref >60)
Glucose, Bld: 105 mg/dL — ABNORMAL HIGH (ref 70–99)
Potassium: 3.8 mmol/L (ref 3.5–5.1)
Sodium: 139 mmol/L (ref 135–145)

## 2022-01-06 LAB — CBC
HCT: 43.2 % (ref 36.0–46.0)
Hemoglobin: 14.3 g/dL (ref 12.0–15.0)
MCH: 29.4 pg (ref 26.0–34.0)
MCHC: 33.1 g/dL (ref 30.0–36.0)
MCV: 88.7 fL (ref 80.0–100.0)
Platelets: 212 10*3/uL (ref 150–400)
RBC: 4.87 MIL/uL (ref 3.87–5.11)
RDW: 11.9 % (ref 11.5–15.5)
WBC: 5.9 10*3/uL (ref 4.0–10.5)
nRBC: 0 % (ref 0.0–0.2)

## 2022-01-06 LAB — TROPONIN I (HIGH SENSITIVITY)
Troponin I (High Sensitivity): 4 ng/L (ref <18)
Troponin I (High Sensitivity): 3 ng/L (ref <18)

## 2022-01-06 MED ORDER — IBUPROFEN 400 MG PO TABS
400.0000 mg | ORAL_TABLET | Freq: Once | ORAL | Status: DC
  Filled 2022-01-06: qty 1

## 2022-01-06 MED ORDER — ACETAMINOPHEN 500 MG PO TABS
1000.0000 mg | ORAL_TABLET | Freq: Once | ORAL | Status: AC
  Administered 2022-01-06: 1000 mg via ORAL
  Filled 2022-01-06: qty 2

## 2022-01-06 MED ORDER — ONDANSETRON 4 MG PO TBDP
4.0000 mg | ORAL_TABLET | Freq: Once | ORAL | Status: AC
  Administered 2022-01-06: 4 mg via ORAL
  Filled 2022-01-06: qty 1

## 2022-01-06 MED ORDER — IPRATROPIUM-ALBUTEROL 0.5-2.5 (3) MG/3ML IN SOLN
3.0000 mL | Freq: Once | RESPIRATORY_TRACT | Status: AC
  Administered 2022-01-06: 3 mL via RESPIRATORY_TRACT
  Filled 2022-01-06: qty 3

## 2022-01-06 NOTE — ED Notes (Signed)
Noted in pt chart to have positive flu swab at PCP visit yesterday. Order Dcd

## 2022-01-06 NOTE — ED Triage Notes (Signed)
Ambulatory to triage with c/o weakness, midsternal chest pain, persistent cough, shortness of breath, and difficulty sleeping.  [facility], told she " may have the flu." and given Prednisone and other medications but did not start taking. Reports sx have not improved.

## 2022-01-06 NOTE — Discharge Instructions (Addendum)
Your work-up in the emergency department was reassuring.  Your cardiac enzymes were negative and your x-ray did not show any evidence of pneumonia.  Use your albuterol inhaler as needed for shortness of breath.  You can continue to use over-the-counter cough medication for cough suppression and take Tylenol and Motrin for pain.  If any of your symptoms are worsening including worsening shortness of breath please return to the emergency department.

## 2022-01-06 NOTE — ED Provider Notes (Signed)
[facility] Provider Note    Event Date/Time   First MD Initiated Contact with Patient 01/06/22 [phone]     (approximate)   History   Chest Pain   HPI  Andrea Ford is a 71 y.o. female with past medical history of COPD/asthma, fibromyalgia hypertension migraines who presents with chest pain and difficulty breathing.  Patient was exposed to someone who was sick last week.  Started to develop cough and congestion symptoms initially last week.  Has progressed to cough productive of clear sometimes yellow sputum that is difficult to control making it hard for her to sleep.  She also endorses pain in her chest that is rather constant worse with coughing.  Feels like she is not getting enough oxygen and that she is short of breath.  No fevers at home has a decreased appetite generalized weakness fatigue and body aches.  She went to a urgent care yesterday and was diagnosed with influenza A.  Patient did not get her flu shot this year.    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   Senile nuclear sclerosis, right 09/04/2018   Enrolled in chronic care management 07/14/2018   Nocturia 01/16/2018   Uterine leiomyoma 01/16/2018   Mixed stress and urge urinary incontinence 12/09/2017   Dysfunction of both eustachian tubes 09/12/2017   Severe episode of recurrent major depressive disorder, without psychotic features ([place]) 08/13/2016   Accelerated hypertension 07/13/2016    Chest pain 07/13/2016   Abnormal EKG 07/13/2016   GAD (generalized anxiety disorder) 07/13/2016   Migraine 03/22/2016   Obstructive airway disease ([place]) 11/02/2015   Chronic cough 05/03/2015   Former cigarette smoker 05/03/2015   Thickened endometrium 05/03/2015   Essential hypertension 02/06/2015   Anxiety with obsessional features 12/28/2012   Eczema 12/28/2012   Allergic rhinitis 03/30/2012   Asthma 12/17/2011   GERD without esophagitis 01/26/2003     Physical Exam  Triage Vital Signs: ED Triage Vitals  Enc Vitals Group     BP 01/06/22 0523 (!) 151/93     Pulse Rate 01/06/22 0523 (!) 114     Resp 01/06/22 0523 20     Temp 01/06/22 0523 98.8 F (37.1 C)     Temp Source 01/06/22 0523 Oral     SpO2 01/06/22 0523 98 %     Weight 01/06/22 0522 145 lb (65.8 kg)     Height 01/06/22 0522 '5\' 1"'$  (1.549 m)     Head Circumference --      Peak Flow --      Pain Score 01/06/22 0521 7     Pain Loc --      Pain Edu? --      Excl. in [place]? --     Most recent vital signs: Vitals:   01/06/22 0749 01/06/22 0848  BP:  120/68  Pulse: 88 88  Resp:  16  Temp:    SpO2: 97% 98%     General: Awake, no distress.  CV:  Good peripheral  perfusion. No edema Resp:  Normal effort. No increased WOB, lungs are clear Abd:  No distention. Soft, nontender throughout Neuro:             Awake, Alert, Oriented x 3  Other:     ED Results / Procedures / Treatments  Labs (all labs ordered are listed, but only abnormal results are displayed) Labs Reviewed  BASIC METABOLIC PANEL - Abnormal; Notable for the following components:      Result Value   Glucose, Bld 105 (*)    Calcium 8.5 (*)    All other components within normal limits  CBC  TROPONIN I (HIGH SENSITIVITY)  TROPONIN I (HIGH SENSITIVITY)     EKG  EKG interpreted by myself shows sinus tachycardia minimal ST depression in the inferior leads similar to prior EKG normal axis normal intervals   RADIOLOGY I reviewed and interpreted  the CXR which does not show any acute cardiopulmonary process    PROCEDURES:  Critical Care performed: No  .1-3 Lead EKG Interpretation Performed by: Rada Hay, MD Authorized by: Rada Hay, MD     Interpretation: normal     ECG rate assessment: normal     Ectopy: none     Conduction: normal    The patient is on the cardiac monitor to evaluate for evidence of arrhythmia and/or significant heart rate changes.   MEDICATIONS ORDERED IN ED: Medications  ibuprofen (ADVIL) tablet 400 mg (400 mg Oral Not Given 01/06/22 0824)  acetaminophen (TYLENOL) tablet 1,000 mg (1,000 mg Oral Given 01/06/22 0823)  ondansetron (ZOFRAN-ODT) disintegrating tablet 4 mg (4 mg Oral Given 01/06/22 0823)  ipratropium-albuterol (DUONEB) 0.5-2.5 (3) MG/3ML nebulizer solution 3 mL (3 mLs Nebulization Given 01/06/22 3825)     IMPRESSION / MDM / ASSESSMENT AND PLAN / ED COURSE  I reviewed the triage vital signs and the nursing notes.                              Differential diagnosis includes, but is not limited to, viral pneumonia, bacterial superinfection after influenza, pleuritis, myocarditis, musculoskeletal  Patient is a 71 year old female past medical history of COPD and fibromyalgia who presents with chest pain body ache shortness of breath and cough.  Symptoms started last week after an exposure to someone with similar viral illness.  [facility] and influenza A testing is positive from yesterday.  She is outside the window for Tamiflu.  She primary complains of cough making it difficult for her to sleep and cough related chest pain as well as dyspnea.  Her initial vital signs are notable for tachycardia however this resolved prior to any intervention.  She is satting 99% on room air on my evaluation.  She does not have any increased work of breathing and she is not wheezing with good air movement.  Her chest x-ray does not show any evidence of pneumonia.  Labs are  all reassuring, including normal renal function no elevation in white blood cell count or leukopenia and a negative troponin.  Her EKG does have some ST depressions in the inferior leads, on review of prior EKG this appears similar somewhat more pronounced today however.  We will repeat the troponin.  Overall her symptoms are not consistent with ACS and I have low suspicion for this.  I am also less concern for myocarditis type picture because of the negative troponins.  Suspect that her cough is  related to pleurisy versus musculoskeletal secondary to cough.  We will give a DuoNeb and see if this helps with her cough and sensation of dyspnea give Zofran Tylenol Motrin and repeat troponin.  Do not feel that Tamiflu will have much use in this situation given she has been sick for almost a week and does have some nausea but would rather not cause more GI symptoms.  Patient's repeat troponin is negative.  Vitals are normal she is oxygenating well.  Presentation is not consistent with ACS think she is appropriate for discharge.  Discussed return precautions for worsening dyspnea     FINAL CLINICAL IMPRESSION(S) / ED DIAGNOSES   Final diagnoses:  Influenza A     Rx / DC Orders   ED Discharge Orders     None        Note:  This document was prepared using Dragon voice recognition software and may include unintentional dictation errors.   Rada Hay, MD 01/06/22 [phone]

## 2022-02-06 ENCOUNTER — Telehealth (INDEPENDENT_AMBULATORY_CARE_PROVIDER_SITE_OTHER): Payer: Self-pay | Admitting: Nurse Practitioner

## 2022-02-06 NOTE — Telephone Encounter (Signed)
Patient called in wanting to know if she needed to have another appointment in order to get scheduled for sclero of her left leg or could we start the PA for her    Please call and advise

## 2022-02-06 NOTE — Telephone Encounter (Signed)
Would we be able to move forward with scheduling sclerotherapy or will the patient need updated follow up. Patient was last seen 04/2021

## 2022-02-07 NOTE — Telephone Encounter (Signed)
LVM for pt TCB and let us schedule a [place] appt + consult in order to continue the sclero appts as I will have to obtain a new PA. If pt calls back, please schedule for:  ls 04/2021 with gs. le reflux + consult. wanting to continue sclero. see fb/gs

## 2022-03-29 ENCOUNTER — Other Ambulatory Visit (INDEPENDENT_AMBULATORY_CARE_PROVIDER_SITE_OTHER): Payer: Self-pay | Admitting: Nurse Practitioner

## 2022-03-29 DIAGNOSIS — I8311 Varicose veins of right lower extremity with inflammation: Secondary | ICD-10-CM

## 2022-03-31 NOTE — Progress Notes (Signed)
MRN : [id number]  Andrea Ford is a 71 y.o. (12-Dec-1950) female who presents with chief complaint of legs hurt and swell.  History of Present Illness:   The patient returns for followup evaluation many months after the initial visit. The patient continues to have pain in the lower extremities with dependency. The pain is lessened with elevation. Graduated compression stockings, Class I (20-30 mmHg), have been worn but the stockings do not eliminate the leg pain. Over-the-counter analgesics do not improve the symptoms. The degree of discomfort continues to interfere with daily activities. The patient notes the pain in the legs is causing problems with daily exercise, at the workplace and even with household activities and maintenance such as standing in the kitchen preparing meals and doing dishes.   Venous ultrasound shows normal deep venous system, no evidence of acute or chronic DVT.  Laser ablation remains successful but varicosities are noted  No outpatient medications have been marked as taking for the 04/01/22 encounter (Appointment) with Delana Meyer, Dolores Lory, MD.    Past Medical History:  Diagnosis Date   Asthma    COPD (chronic obstructive pulmonary disease) ([place])    Hypertension    Migraine     Past Surgical History:  Procedure Laterality Date   ANKLE SURGERY Right    CARDIAC CATHETERIZATION Right 07/15/2016   Procedure: Left Heart Cath and Coronary Angiography;  Surgeon: Dionisio David, MD;  Location: [facility];  Service: Cardiovascular;  Laterality: Right;   CHOLECYSTECTOMY     TUBAL LIGATION      Social History Social History   Tobacco Use   Smoking status: Every Day   Smokeless tobacco: Never  Vaping Use   Vaping Use: Never used  Substance Use Topics   Alcohol use: No   Drug use: No    Family History Family History  Problem Relation Age of Onset   Hypertension Mother    Migraines Mother    Cancer Mother    Varicose Veins Mother     Heart disease Father    Arthritis Father    Hypertension Father    Diabetes Maternal Uncle    Stroke Maternal Grandmother    Stroke Maternal Grandfather    Stroke Paternal Grandmother    Stroke Paternal Grandfather     Allergies  Allergen Reactions   Prednisone Rash and Other (See Comments)    hyper    Diphenhydramine Hcl Other (See Comments)   Metoprolol     Other reaction(s): Hives (106269485) Other reaction(s): Hives ([id number])    Other Other (See Comments)    Pet dander   Oxycodone-Acetaminophen    Codeine Itching and Other (See Comments)   Diphenhydramine Anxiety   Etodolac Rash, Nausea And Vomiting and Other (See Comments)   Hydrocodone Itching   Meperidine Rash and Other (See Comments)    Pt. Not aware  Of ever having any demerol    Venlafaxine Rash and Other (See Comments)     REVIEW OF SYSTEMS (Negative unless checked)  Constitutional: '[]'$ Weight loss  '[]'$ Fever  '[]'$ Chills Cardiac: '[]'$ Chest pain   '[]'$ Chest pressure   '[]'$ Palpitations   '[]'$ Shortness of breath when laying flat   '[]'$ Shortness of breath with exertion. Vascular:  '[]'$ Pain in legs with walking   '[x]'$ Pain in legs at rest  '[]'$ History of DVT   '[]'$ Phlebitis   '[x]'$ Swelling in legs   '[]'$ Varicose veins   '[]'$ Non-healing ulcers Pulmonary:   '[]'$ Uses home oxygen   '[]'$   Productive cough   '[]'$ Hemoptysis   '[]'$ Wheeze  '[]'$ COPD   '[]'$ Asthma Neurologic:  '[]'$ Dizziness   '[]'$ Seizures   '[]'$ History of stroke   '[]'$ History of TIA  '[]'$ Aphasia   '[]'$ Vissual changes   '[]'$ Weakness or numbness in arm   '[]'$ Weakness or numbness in leg Musculoskeletal:   '[]'$ Joint swelling   '[]'$ Joint pain   '[]'$ Low back pain Hematologic:  '[]'$ Easy bruising  '[]'$ Easy bleeding   '[]'$ Hypercoagulable state   '[]'$ Anemic Gastrointestinal:  '[]'$ Diarrhea   '[]'$ Vomiting  '[]'$ Gastroesophageal reflux/heartburn   '[]'$ Difficulty swallowing. Genitourinary:  '[]'$ Chronic kidney disease   '[]'$ Difficult urination  '[]'$ Frequent urination   '[]'$ Blood in urine Skin:  '[]'$ Rashes   '[]'$ Ulcers  Psychological:  '[]'$ History of anxiety   '[]'$   History of major depression.  Physical Examination  There were no vitals filed for this visit. There is no height or weight on file to calculate BMI. Gen: WD/WN, NAD Head: [place]/AT, No temporalis wasting.  Ear/Nose/Throat: Hearing grossly intact, nares w/o erythema or drainage, pinna without lesions Eyes: PER, EOMI, sclera nonicteric.  Neck: Supple, no gross masses.  No JVD.  Pulmonary:  Good air movement, no audible wheezing, no use of accessory muscles.  Cardiac: RRR, precordium not hyperdynamic. Vascular:  Varicosities present bilaterally 6-8 mm diameter.  Moderate venous stasis changes to the legs bilaterally.  1-2+ soft pitting edema  Vessel Right Left  Radial Palpable Palpable  Gastrointestinal: soft, non-distended. No guarding/no peritoneal signs.  Musculoskeletal: M/S 5/5 throughout.  No deformity.  Neurologic: CN 2-12 intact. Pain and light touch intact in extremities.  Symmetrical.  Speech is fluent. Motor exam as listed above. Psychiatric: Judgment intact, Mood & affect appropriate for pt's clinical situation. Dermatologic: Venous rashes no ulcers noted.  No changes consistent with cellulitis. Lymph : No lichenification or skin changes of chronic lymphedema.  CBC Lab Results  Component Value Date   WBC 5.9 01/06/2022   HGB 14.3 01/06/2022   HCT 43.2 01/06/2022   MCV 88.7 01/06/2022   PLT 212 01/06/2022    BMET    Component Value Date/Time   NA 139 01/06/2022 0527   NA 141 11/02/2014 0937   K 3.8 01/06/2022 0527   K 4.0 11/02/2014 0937   CL 105 01/06/2022 0527   CL 107 11/02/2014 0937   CO2 27 01/06/2022 0527   CO2 26 11/02/2014 0937   GLUCOSE 105 (H) 01/06/2022 0527   GLUCOSE 124 (H) 11/02/2014 0937   BUN 12 01/06/2022 0527   BUN 16 11/02/2014 0937   CREATININE 0.73 01/06/2022 0527   CREATININE 0.70 11/02/2014 0937   CALCIUM 8.5 (L) 01/06/2022 0527   CALCIUM 9.2 11/02/2014 0937   GFRNONAA >60 01/06/2022 0527   GFRNONAA >60 11/02/2014 0937   GFRAA >60  01/30/2020 1351   GFRAA >60 11/02/2014 0937   CrCl cannot be calculated (Patient's most recent lab result is older than the maximum 21 days allowed.).  COAG Lab Results  Component Value Date   INR 0.91 08/12/2018   INR 0.94 07/13/2016    Radiology No results found.   Assessment/Plan 1. Varicose veins of both lower extremities with inflammation Recommend:  The patient has had successful ablation of the previously incompetent saphenous venous system but still has persistent symptoms of pain and swelling that are having a negative impact on daily life and daily activities.  Her CEAP classification is C3  Patient should undergo injection sclerotherapy to treat the residual varicosities.  The risks, benefits and alternative therapies were reviewed in detail with the patient.  All questions  were answered.  The patient agrees to proceed with sclerotherapy at their convenience.  The patient will continue wearing the graduated compression stockings and using the over-the-counter pain medications to treat her symptoms.    2. Essential hypertension Continue antihypertensive medications as already ordered, these medications have been reviewed and there are no changes at this time.   3. Asthma, unspecified asthma severity, unspecified whether complicated, unspecified whether persistent Continue pulmonary medications and aerosols as already ordered, these medications have been reviewed and there are no changes at this time.    4. GERD without esophagitis Continue PPI as already ordered, this medication has been reviewed and there are no changes at this time.  Avoidence of caffeine and alcohol  Moderate elevation of the head of the bed      Hortencia Pilar, MD  03/31/2022 4:52 PM

## 2022-04-01 ENCOUNTER — Encounter (INDEPENDENT_AMBULATORY_CARE_PROVIDER_SITE_OTHER): Payer: Self-pay | Admitting: Vascular Surgery

## 2022-04-01 ENCOUNTER — Ambulatory Visit (INDEPENDENT_AMBULATORY_CARE_PROVIDER_SITE_OTHER): Payer: Medicare Other | Admitting: Vascular Surgery

## 2022-04-01 ENCOUNTER — Ambulatory Visit (INDEPENDENT_AMBULATORY_CARE_PROVIDER_SITE_OTHER): Payer: Medicare Other

## 2022-04-01 VITALS — BP 157/90 | HR 69 | Resp 16 | Wt 143.8 lb

## 2022-04-01 DIAGNOSIS — K219 Gastro-esophageal reflux disease without esophagitis: Secondary | ICD-10-CM

## 2022-04-01 DIAGNOSIS — I10 Essential (primary) hypertension: Secondary | ICD-10-CM | POA: Diagnosis not present

## 2022-04-01 DIAGNOSIS — I8311 Varicose veins of right lower extremity with inflammation: Secondary | ICD-10-CM | POA: Diagnosis not present

## 2022-04-01 DIAGNOSIS — J45909 Unspecified asthma, uncomplicated: Secondary | ICD-10-CM

## 2022-04-01 DIAGNOSIS — I8312 Varicose veins of left lower extremity with inflammation: Secondary | ICD-10-CM

## 2022-04-07 ENCOUNTER — Encounter (INDEPENDENT_AMBULATORY_CARE_PROVIDER_SITE_OTHER): Payer: Self-pay | Admitting: Vascular Surgery

## 2022-05-09 ENCOUNTER — Telehealth (INDEPENDENT_AMBULATORY_CARE_PROVIDER_SITE_OTHER): Payer: Self-pay | Admitting: Nurse Practitioner

## 2022-05-09 NOTE — Telephone Encounter (Signed)
LVM for pt TCB and schedule sclero appt  bilateral sclero. see FB auth # [id number] exp: 10.2.23 - 12.31.23. 6 units total  She will need 3 different appts - 4 weeks apart

## 2022-05-14 ENCOUNTER — Ambulatory Visit (INDEPENDENT_AMBULATORY_CARE_PROVIDER_SITE_OTHER): Payer: Medicare Other | Admitting: Nurse Practitioner

## 2022-05-14 ENCOUNTER — Encounter (INDEPENDENT_AMBULATORY_CARE_PROVIDER_SITE_OTHER): Payer: Self-pay | Admitting: Nurse Practitioner

## 2022-05-14 VITALS — BP 143/83 | HR 71 | Resp 17 | Ht 61.0 in | Wt 144.0 lb

## 2022-05-14 DIAGNOSIS — I8311 Varicose veins of right lower extremity with inflammation: Secondary | ICD-10-CM

## 2022-05-14 DIAGNOSIS — I8312 Varicose veins of left lower extremity with inflammation: Secondary | ICD-10-CM

## 2022-05-19 ENCOUNTER — Encounter (INDEPENDENT_AMBULATORY_CARE_PROVIDER_SITE_OTHER): Payer: Self-pay | Admitting: Nurse Practitioner

## 2022-05-19 NOTE — Progress Notes (Signed)
Varicose veins of bilateral  lower extremity with inflammation (454.1  I83.10) Current Plans   Indication: Patient presents with symptomatic varicose veins of the bilateral  lower extremity.   Procedure: Sclerotherapy using hypertonic saline mixed with 1% Lidocaine was performed on the bilateral lower extremity. Compression wraps were placed. The patient tolerated the procedure well. 

## 2022-06-10 ENCOUNTER — Emergency Department: Payer: Medicare Other

## 2022-06-10 ENCOUNTER — Encounter (INDEPENDENT_AMBULATORY_CARE_PROVIDER_SITE_OTHER): Payer: Self-pay

## 2022-06-10 ENCOUNTER — Other Ambulatory Visit: Payer: Self-pay

## 2022-06-10 DIAGNOSIS — N390 Urinary tract infection, site not specified: Secondary | ICD-10-CM | POA: Insufficient documentation

## 2022-06-10 DIAGNOSIS — F419 Anxiety disorder, unspecified: Secondary | ICD-10-CM | POA: Insufficient documentation

## 2022-06-10 DIAGNOSIS — R002 Palpitations: Secondary | ICD-10-CM | POA: Insufficient documentation

## 2022-06-10 DIAGNOSIS — R0789 Other chest pain: Secondary | ICD-10-CM | POA: Diagnosis not present

## 2022-06-10 LAB — COMPREHENSIVE METABOLIC PANEL
ALT: 15 U/L (ref 0–44)
AST: 21 U/L (ref 15–41)
Albumin: 4.2 g/dL (ref 3.5–5.0)
Alkaline Phosphatase: 64 U/L (ref 38–126)
Anion gap: 7 (ref 5–15)
BUN: 17 mg/dL (ref 8–23)
CO2: 27 mmol/L (ref 22–32)
Calcium: 9.4 mg/dL (ref 8.9–10.3)
Chloride: 107 mmol/L (ref 98–111)
Creatinine, Ser: 0.79 mg/dL (ref 0.44–1.00)
GFR, Estimated: >60 mL/min (ref >60)
Glucose, Bld: 115 mg/dL — ABNORMAL HIGH (ref 70–99)
Potassium: 3.7 mmol/L (ref 3.5–5.1)
Sodium: 141 mmol/L (ref 135–145)
Total Bilirubin: 0.6 mg/dL (ref 0.3–1.2)
Total Protein: 7.2 g/dL (ref 6.5–8.1)

## 2022-06-10 LAB — URINALYSIS, ROUTINE W REFLEX MICROSCOPIC
Bacteria, UA: NONE SEEN
Bilirubin Urine: NEGATIVE
Glucose, UA: NEGATIVE mg/dL
Ketones, ur: NEGATIVE mg/dL
Nitrite: NEGATIVE
Protein, ur: NEGATIVE mg/dL
Specific Gravity, Urine: 1.026 (ref 1.005–1.030)
WBC, UA: >50 WBC/hpf — ABNORMAL HIGH (ref 0–5)
pH: 5 (ref 5.0–8.0)

## 2022-06-10 LAB — CBC WITH DIFFERENTIAL/PLATELET
Abs Immature Granulocytes: 0.01 10*3/uL (ref 0.00–0.07)
Basophils Absolute: 0.1 10*3/uL (ref 0.0–0.1)
Basophils Relative: 1 %
Eosinophils Absolute: 0.2 10*3/uL (ref 0.0–0.5)
Eosinophils Relative: 4 %
HCT: 42.8 % (ref 36.0–46.0)
Hemoglobin: 14.5 g/dL (ref 12.0–15.0)
Immature Granulocytes: 0 %
Lymphocytes Relative: 27 %
Lymphs Abs: 1.6 10*3/uL (ref 0.7–4.0)
MCH: 29.1 pg (ref 26.0–34.0)
MCHC: 33.9 g/dL (ref 30.0–36.0)
MCV: 85.9 fL (ref 80.0–100.0)
Monocytes Absolute: 0.5 10*3/uL (ref 0.1–1.0)
Monocytes Relative: 8 %
Neutro Abs: 3.4 10*3/uL (ref 1.7–7.7)
Neutrophils Relative %: 60 %
Platelets: 250 10*3/uL (ref 150–400)
RBC: 4.98 MIL/uL (ref 3.87–5.11)
RDW: 11.8 % (ref 11.5–15.5)
WBC: 5.8 10*3/uL (ref 4.0–10.5)
nRBC: 0 % (ref 0.0–0.2)

## 2022-06-10 LAB — TROPONIN I (HIGH SENSITIVITY): Troponin I (High Sensitivity): 3 ng/L (ref <18)

## 2022-06-10 NOTE — ED Triage Notes (Signed)
Pt presents via POV c/o chest pain and light headedness. Reports took vitals at home and reports HR was 117-127. BP 149/111

## 2022-06-10 NOTE — ED Provider Triage Note (Signed)
Emergency Medicine Provider Triage Evaluation Note  Andrea Ford , a 71 y.o. female  was evaluated in triage.  Pt complains of chest pain.  Patient states that she has central chest pain radiating to her back starting this evening.  No cardiac history personally but strong family history.  She had eaten a fried sandwich prior to the onset of symptoms.  No radiation down the arms.  No fever, chills, emesis, shortness of breath..  Review of Systems  Positive: Symptoms chest pain fever, Negative: Chills, URI symptoms, shortness of breath, cough  Physical Exam  BP (!) 180/86 (BP Location: Right Arm)   Pulse (!) 107   Temp 97.8 F (36.6 C) (Oral)   Resp 18   Ht '5\' 1"'$  (1.549 m)   Wt 66.7 kg   SpO2 96%   BMI 27.78 kg/m  Gen:   Awake, no distress   Resp:  Normal effort  MSK:   Moves extremities without difficulty  Other:    Medical Decision Making  Medically screening exam initiated at 9:17 PM.  Appropriate orders placed.  Andrea Ford was informed that the remainder of the evaluation will be completed by another provider, this initial triage assessment does not replace that evaluation, and the importance of remaining in the ED until their evaluation is complete.  Patient will have labs, EKG, chest x-ray at this time.   Darletta Moll, PA-C 06/10/22 2117

## 2022-06-11 ENCOUNTER — Ambulatory Visit (INDEPENDENT_AMBULATORY_CARE_PROVIDER_SITE_OTHER): Payer: Medicare Other | Admitting: Nurse Practitioner

## 2022-06-11 ENCOUNTER — Emergency Department
Admission: EM | Admit: 2022-06-11 | Discharge: 2022-06-11 | Disposition: A | Discharge status: Home / Self Care | Payer: Medicare Other | Attending: Emergency Medicine | Admitting: Emergency Medicine

## 2022-06-11 DIAGNOSIS — R002 Palpitations: Secondary | ICD-10-CM

## 2022-06-11 DIAGNOSIS — R0789 Other chest pain: Secondary | ICD-10-CM

## 2022-06-11 LAB — TROPONIN I (HIGH SENSITIVITY): Troponin I (High Sensitivity): 3 ng/L (ref <18)

## 2022-06-11 MED ORDER — CEPHALEXIN 500 MG PO CAPS
500.0000 mg | ORAL_CAPSULE | Freq: Two times a day (BID) | ORAL | 0 refills | Status: DC
Start: 2022-06-11 — End: 2022-06-25

## 2022-06-11 NOTE — ED Provider Notes (Signed)
[facility] Provider Note    Event Date/Time   First MD Initiated Contact with Patient 06/11/22 0423     (approximate)   History   Chest Pain   HPI  Andrea Ford is a 71 y.o. female who presents for evaluation of an episode of chest tightness and palpitations.  She said that it happened earlier yesterday.  Nothing in particular was going on but she started to feel like her heart was pounding and beating very fast and after that went on for little while she felt a little bit lightheaded and like she was having some tightness.  She was not necessarily short of breath but was feeling like she had to take deep breaths.  This sort of thing has happened in the past but never so profoundly.  She is on atenolol for episodes of rapid heart rate in the past, but it has not happened to this degree.  She has not had any recent medication changes.  No recent fever, nausea, vomiting, dysuria, or abdominal pain.  She is currently asymptomatic.  She has seen a cardiologist (Dr. Clayborn Bigness) in the past but is not currently seeing anyone.  Most of her providers are at [facility].     Physical Exam   Triage Vital Signs: ED Triage Vitals  Enc Vitals Group     BP 06/10/22 2107 (!) 180/86     Pulse Rate 06/10/22 2107 (!) 107     Resp 06/10/22 2107 18     Temp 06/10/22 2107 97.8 F (36.6 C)     Temp Source 06/10/22 2107 Oral     SpO2 06/10/22 2107 96 %     Weight 06/10/22 2108 66.7 kg (147 lb)     Height 06/10/22 2108 1.549 m ('5\' 1"'$ )     Head Circumference --      Peak Flow --      Pain Score 06/10/22 2125 8     Pain Loc --      Pain Edu? --      Excl. in [place]? --     Most recent vital signs: Vitals:   06/11/22 0249 06/11/22 0441  BP: 128/82 134/80  Pulse: 73 68  Resp: 18 18  Temp: 97.8 F (36.6 C)   SpO2: 96% 100%     General: Awake, no distress.  CV:  Good peripheral perfusion.  Normal heart sounds.  Regular rate and rhythm. Resp:  Normal effort.  Lungs are Faythe Dingwall to  auscultation.  No tender Abd:  No distention.  No tenderness to palpation. Other:  Patient is a little bit anxious but appropriate under the circumstances.  Conversant, alert and oriented, no distress.   ED Results / Procedures / Treatments   Labs (all labs ordered are listed, but only abnormal results are displayed) Labs Reviewed  COMPREHENSIVE METABOLIC PANEL - Abnormal; Notable for the following components:      Result Value   Glucose, Bld 115 (*)    All other components within normal limits  URINALYSIS, ROUTINE W REFLEX MICROSCOPIC - Abnormal; Notable for the following components:   Color, Urine YELLOW (*)    APPearance CLOUDY (*)    Hgb urine dipstick MODERATE (*)    Leukocytes,Ua MODERATE (*)    WBC, UA >50 (*)    All other components within normal limits  URINE CULTURE  CBC WITH DIFFERENTIAL/PLATELET  TROPONIN I (HIGH SENSITIVITY)  TROPONIN I (HIGH SENSITIVITY)     EKG  ED ECG REPORT I, Tommi Rumps  Karma Greaser, the attending physician, personally viewed and interpreted this ECG.  Date: 06/10/2022 EKG Time: 21: 11 Rate: 104 Rhythm: Sinus tachycardia QRS Axis: normal Intervals: normal ST/T Wave abnormalities: Non-specific ST segment / T-wave changes, but no clear evidence of acute ischemia. Narrative Interpretation: no definitive evidence of acute ischemia; does not meet STEMI criteria.  No significant change from prior EKG from about 5 months ago    RADIOLOGY I viewed and interpreted the patient's two-view chest x-ray and I see no evidence of pneumonia or pneumothorax or other acute finding.  I also read the radiologist's report, which confirmed no acute findings.    PROCEDURES:  Critical Care performed: No  .1-3 Lead EKG Interpretation  Performed by: Hinda Kehr, MD Authorized by: Hinda Kehr, MD     Interpretation: normal     ECG rate:  68   ECG rate assessment: normal     Rhythm: sinus rhythm     Ectopy: none     Conduction: normal      MEDICATIONS  ORDERED IN ED: Medications - No data to display   IMPRESSION / MDM / [place] / ED COURSE  I reviewed the triage vital signs and the nursing notes.                              Differential diagnosis includes, but is not limited to, nonspecific palpitations, AVNRT, SVT, electrolyte or metabolic abnormality, anxiety/panic attack, ACS, PE, pneumonia, pneumothorax.  Patient's presentation is most consistent with acute presentation with potential threat to life or bodily function.  Labs/studies ordered: EKG, two-view chest x-ray, CBC with differential, urinalysis, high-sensitivity troponin x2, comprehensive metabolic panel.  The patient is on the cardiac monitor to evaluate for evidence of arrhythmia and/or significant heart rate changes.  Vital signs are stable and within normal limits.  EKG has some nonspecific findings but no evidence of acute ischemia and it is essentially unchanged from 5 months ago.  Labs are all within normal limits including both high-sensitivity troponins.  She initially had a slight bit of tachycardia upon arrival to the ED but resolved quickly without intervention and has had a normal rate.  She is low risk for ACS based on HEAR score and has a Wells score for PE of 0.  I had an extensive conversation with her about nonspecific palpitations, SVT, etc.  I provided reassurance.  I provided an outpatient consult to cardiology.  The patient is comfortable with the plan for discharge and outpatient follow-up.  I considered hospitalization but do not feel it is necessary given her low risk of having an acute or emergent or potentially life-threatening abnormality and she agrees with the plan for follow-up.  I believe she will return to the ED if she develops new or worsening symptoms.  Of note, because it seems to be unrelated to her presenting complaint, I failed to mention to her prior to discharge her positive urinalysis that suggest urinary tract infection.  I  sent an electronic prescription for cephalexin to the pharmacy listed in the system for her, and I sent a CHL message to Caesar Chestnut in the emergency department who will follow-up with her later today after the patient has had the opportunity to get some sleep to explain the situation.      FINAL CLINICAL IMPRESSION(S) / ED DIAGNOSES   Final diagnoses:  Palpitations  Atypical chest pain  UTI   Rx / DC Orders  ED Discharge Orders          Ordered    Ambulatory referral to Cardiology       Comments: If you have not heard from the Cardiology office within the next 72 hours please call [phone].   06/11/22 0522    cephALEXin (KEFLEX) 500 MG capsule  2 times daily        06/11/22 0725             Note:  This document was prepared using Dragon voice recognition software and may include unintentional dictation errors.   Hinda Kehr, MD 06/11/22 [phone]

## 2022-06-11 NOTE — Discharge Instructions (Signed)

## 2022-06-12 LAB — URINE CULTURE: Culture: NO GROWTH

## 2022-06-24 ENCOUNTER — Encounter: Payer: Self-pay | Admitting: Cardiology

## 2022-06-24 ENCOUNTER — Ambulatory Visit: Payer: Medicare Other | Attending: Cardiology

## 2022-06-24 ENCOUNTER — Ambulatory Visit: Payer: Medicare Other | Attending: Cardiology | Admitting: Cardiology

## 2022-06-24 VITALS — BP 138/86 | HR 71 | Ht 61.0 in | Wt 148.2 lb

## 2022-06-24 DIAGNOSIS — I8312 Varicose veins of left lower extremity with inflammation: Secondary | ICD-10-CM

## 2022-06-24 DIAGNOSIS — R072 Precordial pain: Secondary | ICD-10-CM | POA: Diagnosis not present

## 2022-06-24 DIAGNOSIS — I10 Essential (primary) hypertension: Secondary | ICD-10-CM | POA: Diagnosis not present

## 2022-06-24 DIAGNOSIS — R002 Palpitations: Secondary | ICD-10-CM | POA: Diagnosis not present

## 2022-06-24 DIAGNOSIS — J449 Chronic obstructive pulmonary disease, unspecified: Secondary | ICD-10-CM

## 2022-06-24 DIAGNOSIS — E7849 Other hyperlipidemia: Secondary | ICD-10-CM

## 2022-06-24 DIAGNOSIS — R9431 Abnormal electrocardiogram [ECG] [EKG]: Secondary | ICD-10-CM

## 2022-06-24 DIAGNOSIS — I8311 Varicose veins of right lower extremity with inflammation: Secondary | ICD-10-CM | POA: Diagnosis not present

## 2022-06-24 DIAGNOSIS — F17210 Nicotine dependence, cigarettes, uncomplicated: Secondary | ICD-10-CM

## 2022-06-24 DIAGNOSIS — Z87891 Personal history of nicotine dependence: Secondary | ICD-10-CM

## 2022-06-24 MED ORDER — DILTIAZEM HCL ER 180 MG PO CP24: 180.0000 mg | ORAL_CAPSULE | Freq: Every day | ORAL | 0 refills | Status: AC

## 2022-06-24 NOTE — Patient Instructions (Addendum)
Medication Instructions:   See instruction below - Diltiazem 180 mg  -one time only   *If you need a refill on your cardiac medications before your next appointment, please call your pharmacy*   Lab Work:  Possible may need lab- BMP done if your test is schedule after 07/11/22.    If you have labs (blood work) drawn today and your tests are completely normal, you will receive your results only by: [place] (if you have MyChart) OR A paper copy in the mail If you have any lab test that is abnormal or we need to change your treatment, we will call you to review the results.   Testing/Procedures: Will be mailed to your home in 3 to 7 days  Your physician has recommended that you wear a holter monitor 7 days Zio . Holter monitors are medical devices that record the heart's electrical activity. Doctors most often use these monitors to diagnose arrhythmias. Arrhythmias are problems with the speed or rhythm of the heartbeat. The monitor is a small, portable device. You can wear one while you do your normal daily activities. This is usually used to diagnose what is causing palpitations/syncope (passing out).   And  Your physician has requested that you have coronary CTA. Cardiac computed tomography (CT) angiogram is a painless test that uses an x-ray machine to take clear, detailed pictures of your heart.  Please follow instruction sheet as given.    Follow-Up: At [facility], you and your health needs are our priority.  As part of our continuing mission to provide you with exceptional heart care, we have created designated Provider Care Teams.  These Care Teams include your primary Cardiologist (physician) and Advanced Practice Providers (APPs -  Physician Assistants and Nurse Practitioners) who all work together to provide you with the care you need, when you need it.  We recommend signing up for the patient portal called "MyChart".  Sign up information is provided on this After  Visit Summary.  MyChart is used to connect with patients for Virtual Visits (Telemedicine).  Patients are able to view lab/test results, encounter notes, upcoming appointments, etc.  Non-urgent messages can be sent to your provider as well.   To learn more about what you can do with MyChart, go to [email or web address].    Your next appointment:   3 month(s)  The format for your next appointment:   In Person [place] or [facility]   Provider:   Dr Ellyn Hack      Other Instructions  [place] Monitor Instructions  Your physician has requested you wear a ZIO patch monitor for 14 days.  This is a single patch monitor. [employer] supplies one patch monitor per enrollment. Additional stickers are not available. Please do not apply patch if you will be having a Nuclear Stress Test,  Echocardiogram, Cardiac CT, MRI, or Chest Xray during the period you would be wearing the  monitor. The patch cannot be worn during these tests. You cannot remove and re-apply the  ZIO XT patch monitor.  Your ZIO patch monitor will be mailed 3 day USPS to your address on file. It may take 3-5 days  to receive your monitor after you have been enrolled.  Once you have received your monitor, please review the enclosed instructions. Your monitor  has already been registered assigning a specific monitor serial # to you.  Billing and Patient Assistance Program Information  We have supplied [employer] with any of your insurance information on file for  billing purposes. [employer] offers a sliding scale Patient Assistance Program for patients that do not have  insurance, or whose insurance does not completely cover the cost of the ZIO monitor.  You must apply for the Patient Assistance Program to qualify for this discounted rate.  To apply, please call [employer] at [phone], select option 4, select option 2, ask to apply for  Patient Assistance Program. Theodore Demark will ask your household income, and how many people   are in your household. They will quote your out-of-pocket cost based on that information.  [employer] will also be able to set up a 5-month interest-free payment plan if needed.  Applying the monitor   Shave hair from upper left chest.  Hold abrader disc by orange tab. Rub abrader in 40 strokes over the upper left chest as  indicated in your monitor instructions.  Clean area with 4 enclosed alcohol pads. Let dry.  Apply patch as indicated in monitor instructions. Patch will be placed under collarbone on left  side of chest with arrow pointing upward.  Rub patch adhesive wings for 2 minutes. Remove white label marked "1". Remove the white  label marked "2". Rub patch adhesive wings for 2 additional minutes.  While looking in a mirror, press and release button in center of patch. A small green light will  flash 3-4 times. This will be your only indicator that the monitor has been turned on.  Do not shower for the first 24 hours. You may shower after the first 24 hours.  Press the button if you feel a symptom. You will hear a small click. Record Date, Time and  Symptom in the Patient Logbook.  When you are ready to remove the patch, follow instructions on the last 2 pages of Patient  Logbook. Stick patch monitor onto the last page of Patient Logbook.  Place Patient Logbook in the blue and white box. Use locking tab on box and tape box closed  securely. The blue and white box has prepaid postage on it. Please place it in the mailbox as  soon as possible. Your physician should have your test results approximately 7 days after the  monitor [facility]  Call [place] [phone] you have questions regarding  your ZIO XT patch monitor. Call them immediately if you see an orange light blinking on your  monitor.  If your monitor falls off in less than 4 days, contact our Monitor department at [phone]  If your monitor becomes loose or  falls off after 4 days call [employer] at [phone]  suggestions on securing your monitor        Your cardiac CT will be scheduled at one of the below locations:    [facility] [address]([phone] [facility] [address]([phone]    [facility] please arrive 15 mins early for check-in and test prep.   Please follow these instructions carefully (unless otherwise directed):   Wait until you receive a call from radiology to see if a BMP is needed  Possible may need lab- BMP done if your test is schedule after 07/11/22.   On the Night Before the Test: Be sure to Drink plenty of water. Do not consume any caffeinated/decaffeinated beverages or chocolate 12 hours prior to  your test. Do not take any antihistamines 12 hours prior to your test. If the patient has contrast allergy: Patient will need a prescription for Prednisone and very clear instructions (as follows): Prednisone 50 mg - take 13 hours prior to test Take another Prednisone 50 mg 7 hours prior to test Take another Prednisone 50 mg 1 hour prior to test Take Benadryl 50 mg 1 hour prior to test Patient must complete all four doses of above prophylactic medications. Patient will need a ride after test due to Benadryl.  On the Day of the Test: Drink plenty of water until 1 hour prior to the test. Do not eat any food 1 hour prior to test. You may take your regular medications prior to the test.  Take metoprolol (Lopressor) two hours prior to test. FEMALES- please wear underwire-free bra if available, avoid dresses & tight clothing   After the Test: Drink plenty of water. After receiving IV contrast, you may experience a mild flushed feeling. This is normal. On occasion, you may  experience a mild rash up to 24 hours after the test. This is not dangerous. If this occurs, you can take Benadryl 25 mg and increase your fluid intake. If you experience trouble breathing, this can be serious. If it is severe call 911 IMMEDIATELY. If it is mild, please call our office.   We will call to schedule your test 2-4 weeks out understanding that some insurance companies will need an authorization prior to the service being performed.   For non-scheduling related questions, please contact the cardiac imaging nurse navigator should you have any questions/concerns: Marchia Bond, Cardiac Imaging Nurse Navigator Gordy Clement, Cardiac Imaging Nurse Navigator [facility] Direct Office Dial: [phone]   For scheduling needs, including cancellations and rescheduling, please call [place], [phone].

## 2022-06-24 NOTE — Assessment & Plan Note (Signed)
Borderline blood pressure today.  Better on recheck.  She is on 25 of atenolol and 100 of losartan.  Not on diuretic anymore.  Monitor.

## 2022-06-24 NOTE — Assessment & Plan Note (Signed)
Followed by vein and vascular.  Will defer to them.  I recommended support stockings which she does wear.  Foot elevation which she does do.

## 2022-06-24 NOTE — Assessment & Plan Note (Signed)
She says that she is cut down from 1 pack a day to 2 packs a day which is a significant jump.  She acknowledges that she needs to quit, but is not yet ready to do so.  Hopefully she has PFTs evaluated formally by pulmonary medicine, this will convince her to make the next up.  I counseled her on the importance of smoking cessation for both her heart as well as lungs and cancer risk.

## 2022-06-24 NOTE — Assessment & Plan Note (Signed)
Nonspecific findings on EKG.  Nothing concerning.

## 2022-06-24 NOTE — Progress Notes (Unsigned)
Enrolled for [employer] to mail a ZIO XT long term holter monitor to the patients address on file.  

## 2022-06-24 NOTE — Assessment & Plan Note (Signed)
Last LDL was 126.  That was from August.  Not very well controlled.  Her Lipitor dose has been going up and down as high as 80 mg and 20 mg.  With LDL of 126, I think just there is evidence of coronary artery disease, even nonobstructive, would probably just be more aggressive in treating her LDL 70.  For now can be current dose of Lipitor, but will adjust based on Coronary CTA.

## 2022-06-24 NOTE — Progress Notes (Signed)
Primary Care Provider: Erline Levine, MD [place] Cardiologist: None Electrophysiologist: None  Clinic Note: Chief Complaint  Patient presents with   Hospitalization Follow-up    ER follow-up referred for chest pain   Chest Pain   Palpitations    ===================================  ASSESSMENT/PLAN   Problem List Items Addressed This Visit       Cardiology Problems   Essential hypertension (Chronic)    Borderline blood pressure today.  Better on recheck.  She is on 25 of atenolol and 100 of losartan.  Not on diuretic anymore.  Monitor.      Relevant Medications   diltiazem (DILACOR XR) 180 MG 24 hr capsule   Other Relevant Orders   EKG 12-Lead   CT CORONARY MORPH W/CTA COR W/SCORE W/CA W/CM &/OR WO/CM   Basic metabolic panel   LONG TERM MONITOR (3-14 DAYS)   Hyperlipidemia due to dietary fat intake (Chronic)    Last LDL was 126.  That was from August.  Not very well controlled.  Her Lipitor dose has been going up and down as high as 80 mg and 20 mg.  With LDL of 126, I think just there is evidence of coronary artery disease, even nonobstructive, would probably just be more aggressive in treating her LDL 70.  For now can be current dose of Lipitor, but will adjust based on Coronary CTA.      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  Will defer to them.  I recommended support stockings which she does wear.  Foot elevation which she does do.      Relevant Medications   diltiazem (DILACOR XR) 180 MG 24 hr capsule   Other Relevant Orders   EKG 12-Lead   CT CORONARY MORPH W/CTA COR W/SCORE W/CA W/CM &/OR WO/CM   Basic metabolic panel   LONG TERM MONITOR (3-14 DAYS)      Other   Obstructive airway disease (HCC)   Relevant Orders   EKG 12-Lead   CT CORONARY MORPH W/CTA COR W/SCORE W/CA W/CM &/OR WO/CM   Basic metabolic panel   LONG TERM MONITOR (3-14 DAYS)   Precordial pain - Primary    Atypical sounding chest.  Lots of exertion.  However given her risk factors is not unreasonable to essentially confirm CAD findings her last Stress test.  I do think it Coronary CTA Aveline effective assessing for any obstructive disease which could.  Determine significant trouble with the control also disease.  Plan: Coronary CTA and 7-day Zio patch      Relevant Orders   EKG 12-Lead   CT CORONARY MORPH W/CTA COR W/SCORE W/CA W/CM &/OR WO/CM   Basic metabolic panel   LONG TERM MONITOR (3-14 DAYS)   Rapid palpitations    Hard to tell with the symptoms could be A-fib or SVT since her associate with chest pain.  Would need to exclude arrhythmia persistently tachycardia.  Plan: 7-day Zio patch      Relevant Orders   EKG 12-Lead   CT CORONARY MORPH W/CTA COR W/SCORE W/CA W/CM &/OR WO/CM   Basic metabolic panel   LONG TERM MONITOR (3-14 DAYS)   Abnormal EKG (Chronic)  Nonspecific findings on EKG.  Nothing concerning.      Heavy smoker (more than 20 cigarettes per day) (Chronic)    She says that she is cut down from 1 pack a day to 2 packs a day which is a significant jump.  She acknowledges that she needs to quit, but is not yet ready to do so.  Hopefully she has PFTs evaluated formally by pulmonary medicine, this will convince her to make the next up.  I counseled her on the importance of smoking cessation for both her heart as well as lungs and cancer risk.      Relevant Orders   EKG 12-Lead   CT CORONARY MORPH W/CTA COR W/SCORE W/CA W/CM &/OR WO/CM   Basic metabolic panel   LONG TERM MONITOR (3-14 DAYS)   ===================================  HPI:    Andrea Ford is a 71 y.o. female walker smoker (now 1 PPD from 2 PPD) with HTN, HLD, (NORMAL  CORONARIES BY CATH IN 2017-NEGATIVE MYOVIEW IN 2020), COPD/ASHTMA and anxiety, palpitations who is being seen today for the evaluation of PALPITATIONS AND CHEST PAIN at the request of Hinda Kehr, MD.  Seen by Dr. Clayborn Bigness back in February 2020 for evaluation of hypertension and palpitations as well as a murmur.  She noted to have fluctuating blood pressure as well as tachycardia and mild chest pains.  Some exertional dyspnea.  (Recently had a Myoview at [facility] that was unremarkable.  This visit was following ER visit. => Considered adding metoprolol 25 mg twice daily and switch from enalapril to losartan.  Recommended weight loss.  Continued on Lipitor.  (She indicated that she quit smoking in 2016).  Started on initially on Cardizem.  Was also started on clonidine for BP.  Suggested possibility of sleep apnea.  Was eventually switched to atenolol. ==> Last visit with Dr. Jannifer Rodney was in August 2020-noted hypotension recommend holding losartan.  Clonidine made PRN continued on Lipitor  She has been referred to [place] [facility] for peripheral venous disease/stasis.  Venous Dopplers ordered.  No significant chronic reflux.  Andrea Ford was last seen on 04/01/2022 by Dr. Brantley Fling continued pain in lower extremities with dependency.  Pain did lessen with great gradual compression stockings (20-30 mmHg).  Did not fully evaluate pain.  Over-the-counter allergies did not help either.  If he has a daily activity.  Because of problems with daily exercise.  Apparently history of laser ablation of varicosities were noted.  CEAP C3.  Continue to wear support stockings.  Avoid caffeine and alcohol.  Recent Hospitalizations:  [facility] ER 06/11/2022-chest tightness and palpitations.  Felt her heart was pounding and beating very fast.  Lasted a little while associated lightheadedness and tightness.  Not associated dyspnea.  Just took some deep breaths.  Never had an episode this profound.  Had  previously been seen by Dr. Clayborn Bigness in [place], but most providers are with [facility], therefore she was referred to cardiology at [place] to be low risk.  Recommend outpatient cardiology follow-up. She was also diagnosed with UTI.  Reviewed  CV studies:    The following studies were reviewed today: (if available, images/films reviewed: From Epic Chart or Care Everywhere) Cardiac Catheterization 07/15/2016 ([place]): Normal Coronaries with Normal EF.  Stress PET ([place]) 08/26/2018: Normal myocardial perfusion study with no evidence of ischemia or infarction.  Normal systolic function with EF greater than 65%.  Coronary Calcium Score is noted. Echo 10/05/2018 (  [place]: [place]): Normal LV Systolic Fxn - EF > 57%.  GR 1 DD.  No RWMA.  Normal RV function. Normal chamber sizes & Valves.   Interval History:   Andrea Ford presents here today with multiple complaints: Chest tightness and pressure episodes that can last anywhere from 15 seconds to 5 minutes.  Nothing more prolonged, but recurrent.  Described as an aching across the chest not necessarily localized.  Associated with fatigue.   However chest pain can happen with or without exertion. This is associated with sweating and lightheadedness and sense of unease. At least 1 episode was noted with heart rate ranging 110 - 130 bpm bpm which is what made her go to the ER. Exercise intolerance with fatigue exertional dyspnea plus or minus heart rate going up. Periodic episodes of rapid irregular heartbeats but otherwise some slow pounding forceful heartbeat.  Usually with any type of exertion are able to offer a fast, but this can also happen at rest.  Does not seem to be as irregular, but can be associated with chest discomfort.-These have been pretty well controlled on atenolol, but now recurring. Has lots of anxiety and stress takes as needed Klonopin but also uses  trazodone to help her sleep.  She really is trying to avoid pain medications or using benzos.  CV Review of Symptoms (Summary): Cardiovascular ROS: positive for - chest pain, dyspnea on exertion, palpitations, rapid heart rate, and Generalized Fatigue; diffuse leg pain and swelling with leg aching all the time.  Diffuse venous stasis changes and leg aching.  Bilateral Baker cysts. negative for - orthopnea, paroxysmal nocturnal dyspnea, shortness of breath, or lightheadedness or dizziness wooziness with exception of the spells where her heart rate goes fast.  No syncope or near syncope, TIA/amaurosis fugax.  No claudication.  REVIEWED OF SYSTEMS   Review of Systems  Constitutional:  Positive for malaise/fatigue. Negative for chills and fever.  HENT:  Positive for congestion. Negative for nosebleeds.   Respiratory:  Positive for cough and shortness of breath. Negative for wheezing.   Cardiovascular:  Positive for chest pain, palpitations and leg swelling.  Gastrointestinal:  Negative for blood in stool, constipation and melena.  Genitourinary:  Negative for dysuria and hematuria.  Musculoskeletal:  Positive for back pain and joint pain. Negative for falls.       Bilateral leg/lower extremity pain  Neurological:  Positive for dizziness, weakness and headaches (Periodic). Negative for focal weakness.  Psychiatric/Behavioral:  Negative for depression (More dysthymia) and memory loss. The patient is nervous/anxious. The patient does not have insomnia.     I have reviewed and (if needed) personally updated the patient's problem list, medications, allergies, past medical and surgical history, social and family history.   PAST MEDICAL HISTORY   Past Medical History:  Diagnosis Date   Asthma    COPD (chronic obstructive pulmonary disease) (HCC)    Hypertension    Migraine    Symptomatic varicose veins, bilateral    With multiple intervention is now with if sense of swelling and stasis changes.     PAST SURGICAL HISTORY   Past Surgical History:  Procedure Laterality Date   ANKLE SURGERY Right    CARDIAC CATHETERIZATION Right 07/15/2016   Procedure: Left Heart Cath and Coronary Angiography;  Surgeon: Dionisio Kariana Wiles, MD;  Location: [place] [facility];  Service: Cardiovascular;  Laterality: Right;   CHOLECYSTECTOMY     TUBAL LIGATION     Venous Sclerotherapy Bilateral  Lower extremities-[facility].    Immunization History  Administered Date(s) Administered   Tdap 05/19/2016    MEDICATIONS/ALLERGIES   Current Meds  Medication Sig   acetaminophen (TYLENOL) 650 MG CR tablet Take by mouth.   albuterol (PROVENTIL HFA;VENTOLIN HFA) 108 (90 BASE) MCG/ACT inhaler Inhale into the lungs every 6 (six) hours as needed for wheezing or shortness of breath.   albuterol (PROVENTIL) (2.5 MG/3ML) 0.083% nebulizer solution Take 3 mLs (2.5 mg total) by nebulization every 6 (six) hours as needed for wheezing or shortness of breath.   atenolol (TENORMIN) 25 MG tablet Take 25 mg by mouth daily.   atorvastatin (LIPITOR) 40 MG tablet Take 1 tablet (40 mg total) by mouth daily at 6 PM. (Patient taking differently: Take 20 mg by mouth daily.)   busPIRone (BUSPAR) 10 MG tablet    Cholecalciferol (VITAMIN D-3 PO) Take 1 tablet by mouth daily.   citalopram (CELEXA) 20 MG tablet Take 20 mg by mouth daily.    clonazePAM (KLONOPIN) 0.5 MG tablet Take 0.25 mg by mouth 2 (two) times daily as needed for anxiety.   dicyclomine (BENTYL) 20 MG tablet Take 20 mg by mouth daily as needed for spasms.   estradiol (ESTRACE) 0.1 MG/GM vaginal cream INSERT 2 GRAMS INTO THE VAGINA DAILY FOR 1 TO 2 WEEKS THEN GRADUALLY REDUCE TO HALF THE INITIAL DOSE FOR 1 TO 2 WEEKS FOLLOWED BY A MAINTENA   famotidine (PEPCID) 20 MG tablet Take by mouth.   fluticasone (FLONASE) 50 MCG/ACT nasal spray 1 spray into each nostril daily as needed for allergies.   losartan (COZAAR) 100 MG tablet Take 1 tablet by mouth  daily.   Multiple Vitamin (MULTIVITAMIN) capsule Take by mouth.   sodium chloride 0.9 % nebulizer solution    traZODone (DESYREL) 50 MG tablet Take 50 mg by mouth at bedtime as needed for sleep.    Allergies  Allergen Reactions   Prednisone Rash and Other (See Comments)    hyper    Diphenhydramine Hcl Other (See Comments)   Metoprolol     Other reaction(s): Hives (161096045) Other reaction(s): Hives (409811914)    Other Other (See Comments)    Pet dander   Oxycodone-Acetaminophen    Codeine Itching and Other (See Comments)   Diphenhydramine Anxiety   Etodolac Rash, Nausea And Vomiting and Other (See Comments)   Hydrocodone Itching   Meperidine Rash and Other (See Comments)    Pt. Not aware  Of ever having any demerol    Venlafaxine Rash and Other (See Comments)    SOCIAL HISTORY/FAMILY HISTORY   Reviewed in Epic:   Social History   Tobacco Use   Smoking status: Every Day   Smokeless tobacco: Never  Vaping Use   Vaping Use: Never used  Substance Use Topics   Alcohol use: No   Drug use: No   Social History   Social History Narrative   Not on file   Family History  Problem Relation Age of Onset   Hypertension Mother    Migraines Mother    Cancer Mother    Varicose Veins Mother    Coronary artery disease Father 47       Died prior to CABG today 10   Arthritis Father    Hypertension Father    Heart attack Father 78       Age 29 and 47   Diabetes Mellitus I Brother    Stroke Maternal Grandmother 68   Stroke Maternal Grandfather 2  Stroke Paternal Grandmother 72   Stroke Paternal Grandfather    Diabetes Maternal Uncle    Coronary artery disease Paternal Uncle 53       CABG    OBJCTIVE -PE, EKG, labs   Wt Readings from Last 3 Encounters:  06/24/22 148 lb 3.2 oz (67.2 kg)  06/10/22 147 lb (66.7 kg)  05/14/22 144 lb (65.3 kg)    Physical Exam: BP 138/86   Pulse 71   Ht _0  (1.549 m)   Wt 148 lb 3.2 oz (67.2 kg)   SpO2 97%   BMI 28.00  kg/m . Vitals:   06/24/22 1402 06/24/22 1425  BP: (!) 142/90 138/86  Pulse: 71   Height: _1  (1.549 m)   Weight: 148 lb 3.2 oz (67.2 kg)   SpO2: 97%   BMI (Calculated): 28.02     Physical Exam Vitals reviewed.  Constitutional:      General: She is not in acute distress.    Appearance: Normal appearance. She is normal weight. She is not ill-appearing (Does have a generalized ill appearance) or toxic-appearing.  HENT:     Head: Normocephalic and atraumatic.  Eyes:     Extraocular Movements: Extraocular movements intact.     Comments: Heavy/thick glasses.  Neck:     Vascular: Normal carotid pulses. No carotid bruit, hepatojugular reflux or JVD.  Cardiovascular:     Rate and Rhythm: Normal rate and regular rhythm.     Pulses: Normal pulses.     Heart sounds: Normal heart sounds. No murmur heard.    No friction rub. No gallop.  Pulmonary:     Effort: Pulmonary effort is normal. No respiratory distress.     Breath sounds: No wheezing, rhonchi or rales.  Chest:     Chest wall: No tenderness.  Abdominal:     General: Abdomen is flat. Bowel sounds are normal. There is no distension.     Palpations: Abdomen is soft.     Tenderness: There is no abdominal tenderness. There is no guarding or rebound.  Musculoskeletal:     Cervical back: Normal range of motion and neck supple.  Neurological:     Mental Status: She is alert.     Adult ECG Report  Rate: 71 ;  Rhythm: normal sinus rhythm and nonspecific ST-T wave changes.  Otherwise normal axis, intervals and durations. ;   Narrative Interpretation: Borderline  Recent Labs:   04/03/2022: TC 220, TG 145, HDL 65, LDL 126; Na 140, K+ 3.8, Cl- 105, CO2 27.2, BUN 7, Cr 0.7, GLU 3, Ca++ 9.0, AST 21, ALT 16, alk phos 66.;  TSH 1.03  Lab Results  Component Value Date   CREATININE 0.79 06/10/2022   BUN 17 06/10/2022   NA 141 06/10/2022   K 3.7 06/10/2022   CL 107 06/10/2022   CO2 27 06/10/2022      Latest Ref Rng & Units 06/10/2022     9:19 PM 01/06/2022    5:27 AM 04/15/2021    4:14 AM  CBC  WBC 4.0 - 10.5 K/uL 5.8  5.9  5.1   Hemoglobin 12.0 - 15.0 g/dL 14.5  14.3  14.6   Hematocrit 36.0 - 46.0 % 42.8  43.2  42.6   Platelets 150 - 400 K/uL 250  212  224     No results found for: "HGBA1C" No results found for: "TSH"  ================================================== I spent a total of 44 minutes with the patient spent in direct patient consultation.  Additional time  spent with chart review  / charting (studies, outside notes, etc): 37 min Total Time: 81 min  Current medicines are reviewed at length with the patient today.  (+/- concerns) none  Notice: This dictation was prepared with Dragon dictation along with smart phrase technology. Any transcriptional errors that result from this process are unintentional and may not be corrected upon review.   Studies Ordered:  Orders Placed This Encounter  Procedures   CT CORONARY MORPH W/CTA COR W/SCORE W/CA W/CM &/OR WO/CM   Basic metabolic panel   LONG TERM MONITOR (3-14 DAYS)   EKG 12-Lead   Meds ordered this encounter  Medications   diltiazem (DILACOR XR) 180 MG 24 hr capsule    Sig: Take 1 capsule (180 mg total) by mouth daily. Take 2 hour prior to Coronary CTA. One time only    Dispense:  1 capsule    Refill:  0    Patient Instructions / Medication Changes & Studies & Tests Ordered   Patient Instructions  Medication Instructions:   See instruction below - Diltiazem 180 mg  -one time only   *If you need a refill on your cardiac medications before your next appointment, please call your pharmacy*   Lab Work:  Possible may need lab- BMP done if your test is schedule after 07/11/22.    If you have labs (blood work) drawn today and your tests are completely normal, you will receive your results only by: [place] (if you have MyChart) OR A paper copy in the mail If you have any lab test that is abnormal or we need to change your treatment, we  will call you to review the results.   Testing/Procedures: Will be mailed to your home in 3 to 7 days  Your physician has recommended that you wear a holter monitor 7 days Zio . Holter monitors are medical devices that record the heart's electrical activity. Doctors most often use these monitors to diagnose arrhythmias. Arrhythmias are problems with the speed or rhythm of the heartbeat. The monitor is a small, portable device. You can wear one while you do your normal daily activities. This is usually used to diagnose what is causing palpitations/syncope (passing out).   And  Your physician has requested that you have coronary CTA. Cardiac computed tomography (CT) angiogram is a painless test that uses an x-ray machine to take clear, detailed pictures of your heart.  Please follow instruction sheet as given.    Follow-Up: At [facility], you and your health needs are our priority.  As part of our continuing mission to provide you with exceptional heart care, we have created designated Provider Care Teams.  These Care Teams include your primary Cardiologist (physician) and Advanced Practice Providers (APPs -  Physician Assistants and Nurse Practitioners) who all work together to provide you with the care you need, when you need it.  We recommend signing up for the patient portal called "MyChart".  Sign up information is provided on this After Visit Summary.  MyChart is used to connect with patients for Virtual Visits (Telemedicine).  Patients are able to view lab/test results, encounter notes, upcoming appointments, etc.  Non-urgent messages can be sent to your provider as well.   To learn more about what you can do with MyChart, go to [email or web address].    Your next appointment:   3 month(s)  The format for your next appointment:   In Person [place] or [place]   Provider:   Dr Ellyn Hack  Other Instructions  ZIO XT- Long Term Monitor Instructions  Your physician has  requested you wear a ZIO patch monitor for 14 days.  This is a single patch monitor. [employer] supplies one patch monitor per enrollment. Additional stickers are not available. Please do not apply patch if you will be having a Nuclear Stress Test,  Echocardiogram, Cardiac CT, MRI, or Chest Xray during the period you would be wearing the  monitor. The patch cannot be worn during these tests. You cannot remove and re-apply the  ZIO XT patch monitor.  Your ZIO patch monitor will be mailed 3 day USPS to your address on file. It may take 3-5 days  to receive your monitor after you have been enrolled.  Once you have received your monitor, please review the enclosed instructions. Your monitor  has already been registered assigning a specific monitor serial # to you.  Billing and Patient Assistance Program Information  We have supplied [employer] with any of your insurance information on file for billing purposes. [employer] offers a sliding scale Patient Assistance Program for patients that do not have  insurance, or whose insurance does not completely cover the cost of the ZIO monitor.  You must apply for the Patient Assistance Program to qualify for this discounted rate.  To apply, please call [employer] at [phone], select option 4, select option 2, ask to apply for  Patient Assistance Program. Theodore Demark will ask your household income, and how many people  are in your household. They will quote your out-of-pocket cost based on that information.  [employer] will also be able to set up a 24-month interest-free payment plan if needed.  Applying the monitor   Shave hair from upper left chest.  Hold abrader disc by orange tab. Rub abrader in 40 strokes over the upper left chest as  indicated in your monitor instructions.  Clean area with 4 enclosed alcohol pads. Let dry.  Apply patch as indicated in monitor instructions. Patch will be placed under collarbone on left  side of chest with arrow pointing  upward.  Rub patch adhesive wings for 2 minutes. Remove white label marked "1". Remove the white  label marked "2". Rub patch adhesive wings for 2 additional minutes.  While looking in a mirror, press and release button in center of patch. A small green light will  flash 3-4 times. This will be your only indicator that the monitor has been turned on.  Do not shower for the first 24 hours. You may shower after the first 24 hours.  Press the button if you feel a symptom. You will hear a small click. Record Date, Time and  Symptom in the Patient Logbook.  When you are ready to remove the patch, follow instructions on the last 2 pages of Patient  Logbook. Stick patch monitor onto the last page of Patient Logbook.  Place Patient Logbook in the blue and white box. Use locking tab on box and tape box closed  securely. The blue and white box has prepaid postage on it. Please place it in the mailbox as  soon as possible. Your physician should have your test results approximately 7 days after the  monitor has been mailed back to [facility]  Call [place] [phone] you have questions regarding  your ZIO XT patch monitor. Call them immediately if you see an orange light blinking on your  monitor.  If your monitor falls off in less than 4 days, contact our Monitor department at  [phone].  If your monitor becomes loose or falls off after 4 days call [employer] at [phone] for  suggestions on securing your monitor        Your cardiac CT will be scheduled at one of the below locations:    [facility] [place], [place] [address] [phone]  [facility] [place], [place] [address] [phone]     If scheduled at [facility] or [facility], please arrive 15 mins early for check-in and test  prep.   Please follow these instructions carefully (unless otherwise directed):   Wait until you receive a call from radiology to see if a BMP is needed  Possible may need lab- BMP done if your test is schedule after 07/11/22.   On the Night Before the Test: Be sure to Drink plenty of water. Do not consume any caffeinated/decaffeinated beverages or chocolate 12 hours prior to your test. Do not take any antihistamines 12 hours prior to your test. If the patient has contrast allergy: Patient will need a prescription for Prednisone and very clear instructions (as follows): Prednisone 50 mg - take 13 hours prior to test Take another Prednisone 50 mg 7 hours prior to test Take another Prednisone 50 mg 1 hour prior to test Take Benadryl 50 mg 1 hour prior to test Patient must complete all four doses of above prophylactic medications. Patient will need a ride after test due to Benadryl.  On the Day of the Test: Drink plenty of water until 1 hour prior to the test. Do not eat any food 1 hour prior to test. You may take your regular medications prior to the test.  Take metoprolol (Lopressor) two hours prior to test. FEMALES- please wear underwire-free bra if available, avoid dresses & tight clothing   After the Test: Drink plenty of water. After receiving IV contrast, you may experience a mild flushed feeling. This is normal. On occasion, you may experience a mild rash up to 24 hours after the test. This is not dangerous. If this occurs, you can take Benadryl 25 mg and increase your fluid intake. If you experience trouble breathing, this can be serious. If it is severe call 911 IMMEDIATELY. If it is mild, please call our office.   We will call to schedule your test 2-4 weeks out understanding that some insurance companies will need an authorization prior to the service being performed.   For non-scheduling related questions, please contact the cardiac imaging nurse navigator should you  have any questions/concerns: Marchia Bond, Cardiac Imaging Nurse Navigator Gordy Clement, Cardiac Imaging Nurse Navigator [facility] Direct Office Dial: [phone]   For scheduling needs, including cancellations and rescheduling, please call [place], [phone].     Leonie Man, MD, MS Glenetta Hew, M.D., M.S. Interventional Cardiologist  [place]  Pager # [phone] Phone # [phone] [address] [place], [place] [address]   Thank you for choosing [facility]!!

## 2022-06-24 NOTE — Assessment & Plan Note (Signed)
Atypical sounding chest.  Lots of exertion.  However given her risk factors is not unreasonable to essentially confirm CAD findings her last Stress test.  I do think it Coronary CTA Aveline effective assessing for any obstructive disease which could.  Determine significant trouble with the control also disease.  Plan: Coronary CTA and 7-day Zio patch

## 2022-06-24 NOTE — Assessment & Plan Note (Signed)
Hard to tell with the symptoms could be A-fib or SVT since her associate with chest pain.  Would need to exclude arrhythmia persistently tachycardia.  Plan: 7-day Zio patch

## 2022-06-25 ENCOUNTER — Ambulatory Visit (INDEPENDENT_AMBULATORY_CARE_PROVIDER_SITE_OTHER): Payer: Medicare Other | Admitting: Nurse Practitioner

## 2022-06-25 ENCOUNTER — Encounter (INDEPENDENT_AMBULATORY_CARE_PROVIDER_SITE_OTHER): Payer: Self-pay | Admitting: Nurse Practitioner

## 2022-06-25 VITALS — BP 141/92 | HR 84 | Resp 18 | Ht 61.0 in | Wt 149.2 lb

## 2022-06-25 DIAGNOSIS — I8312 Varicose veins of left lower extremity with inflammation: Secondary | ICD-10-CM

## 2022-06-25 DIAGNOSIS — I8311 Varicose veins of right lower extremity with inflammation: Secondary | ICD-10-CM

## 2022-06-28 ENCOUNTER — Ambulatory Visit (INDEPENDENT_AMBULATORY_CARE_PROVIDER_SITE_OTHER): Payer: Medicare Other

## 2022-06-28 ENCOUNTER — Other Ambulatory Visit (INDEPENDENT_AMBULATORY_CARE_PROVIDER_SITE_OTHER): Payer: Self-pay | Admitting: Vascular Surgery

## 2022-06-28 ENCOUNTER — Encounter: Payer: Self-pay | Admitting: Cardiology

## 2022-06-28 DIAGNOSIS — M79661 Pain in right lower leg: Secondary | ICD-10-CM

## 2022-06-28 DIAGNOSIS — M79662 Pain in left lower leg: Secondary | ICD-10-CM | POA: Diagnosis not present

## 2022-06-28 DIAGNOSIS — R002 Palpitations: Secondary | ICD-10-CM | POA: Diagnosis not present

## 2022-07-08 ENCOUNTER — Encounter (INDEPENDENT_AMBULATORY_CARE_PROVIDER_SITE_OTHER): Payer: Self-pay | Admitting: Nurse Practitioner

## 2022-07-08 NOTE — Progress Notes (Signed)
The patient presented today initially for sclerotherapy however her due to office relocation the patient was a little over 30 minutes late prior to her appointment and she had significant questions before proceeding with sclerotherapy.  Based on this we did not move forward with any sclerotherapy today.  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  We discussed that sclerotherapy and saline sclerotherapy is typically only done for very surface level varicosities and therefore a DVT is not possible with this intervention.  In some cases patients can have superficial phlebitis that is typically very rare.  The patient has been having worsening swelling and she notes that she has been wearing her compression.  In addition to having varicosities the patient does have known lymphedema.  I have reiterated that the patient may have issues with swelling given her known lymphedema even with treatment with sclerotherapy.  Following the litany of questions that the patient had today, it was felt that it was her best interest to not proceed with intervention today.  The patient will present for a DVT study as her largest concern is that she has a DVT that is causing her swelling.  I have reassured her that based on her symptoms she does not have a DVT but we will still obtain a study for patient's peace of mind.  Patient has a sclerotherapy appointment and she will keep that appointment unless she ultimately decides she does not wish to move forward with sclerotherapy.

## 2022-07-10 ENCOUNTER — Ambulatory Visit (INDEPENDENT_AMBULATORY_CARE_PROVIDER_SITE_OTHER): Payer: Medicare Other | Admitting: Nurse Practitioner

## 2022-07-12 ENCOUNTER — Telehealth (HOSPITAL_COMMUNITY): Payer: Self-pay | Admitting: *Deleted

## 2022-07-12 NOTE — Telephone Encounter (Signed)
Attempted to call patient regarding upcoming cardiac CT appointment. °Left message on voicemail with name and callback number ° °Jaylnn Ullery RN Navigator Cardiac Imaging °[facility] °[phone] Office °[phone] Cell ° °

## 2022-07-13 LAB — BASIC METABOLIC PANEL
BUN/Creatinine Ratio: 19 (ref 12–28)
BUN: 15 mg/dL (ref 8–27)
CO2: 26 mmol/L (ref 20–29)
Calcium: 9.5 mg/dL (ref 8.7–10.3)
Chloride: 103 mmol/L (ref 96–106)
Creatinine, Ser: 0.81 mg/dL (ref 0.57–1.00)
Glucose: 99 mg/dL (ref 70–99)
Potassium: 4.3 mmol/L (ref 3.5–5.2)
Sodium: 142 mmol/L (ref 134–144)
eGFR: 79 mL/min/{1.73_m2} (ref >59)

## 2022-07-15 ENCOUNTER — Ambulatory Visit: Admission: RE | Admit: 2022-07-15 | Payer: Medicare Other | Source: Ambulatory Visit

## 2022-07-15 ENCOUNTER — Encounter: Payer: Self-pay | Admitting: Cardiology

## 2022-07-22 ENCOUNTER — Telehealth: Payer: Self-pay | Admitting: Cardiology

## 2022-07-22 NOTE — Telephone Encounter (Signed)
Leonie Man, MD 07/20/2022  2:53 PM EST     Monitor results:     Zio Patch Wear Time:  7 days and 12 hours (2023-11-17T21:14:29-0500 to 2023-11-25T09:47:10-498)   Predominant Underlying Rhythm: Sinus Rhythm: HR range 44-135 bpm, Avg 77 bpm; with intermittent Wenckebach Block (Mobitz type I)   Rare isolated premature atrial contractions PACs (<1%) noted, with rare couplets Rare isolated premature ventricular contractions (PVCs) (<1 %) noted, with rare couplets; no bigeminy and trigeminy     No Sustained or Nonsustained Arrhythmias (abnormal rhythms): Atrial Tachycardia (AT), Supraventricular Tachycardia (SVT), Atrial Fibrillation (A-Fib), Atrial Flutter (A-Flutter), Sustained Ventricular Tachycardia (VT)   Patient Triggers: Sinus rhythm with rates ranging from 60s to 110s bpm.  Symptoms not really associated with PACs or PVCs.     ** Overall relatively normal study with no obvious abnormalities. No abnormal rhythms. Minimal extra beats.     Glenetta Hew, MD

## 2022-07-22 NOTE — Telephone Encounter (Signed)
Attempted to contact the patient again regarding her monitor results. I was able to speak with her. She voices understanding of these results.  She then proceeded to tell me that she had cancelled her previously scheduled Cardiac CT from 07/15/22 due to a biopsy that was done on her thyroid. She was having some issues from the sight and wanted that to heal a little more.  I have advised the patient that we can reschedule her Cardiac CT at anytime/ whenever she is ready.  She did have some concerns about the Cardiac CT and was unsure why she was being given Diltiazem before the scan.  We have reviewed the Cardiac CT procedure. I have also reviewed the reason for taking the Diltiazem 2 hours prior to her scan for heart rate control. The patient states she has the 1 time dose of Diltiazem still on hand that was sent to the pharmacy for her at the time of her visit with Dr. Ellyn Hack in the [place] office on 06/24/22.  The patient has a documented metoprolol allergy, but I have advised her I want to clarify with Dr. Ellyn Hack if he wanting her to take the Dilitazem that was sent in for her as this looks like an extended release capsule.  diltiazem (DILACOR XR) 180 MG 24 hr capsule 1 capsule 0 06/24/2022    Sig - Route: Take 1 capsule (180 mg total) by mouth daily. Take 2 hour prior to Coronary CTA. One time only - Oral   Sent to pharmacy as: diltiazem (DILACOR XR) 180 MG 24 hr capsule   E-Prescribing Status: Receipt confirmed by pharmacy (06/24/2022  3:06 PM EST)     She is aware I will call her back later this week once I can clarify with Dr. Ellyn Hack and we can discuss the timing of rescheduling her Cardiac CT. The patient voices understanding and is agreeable.

## 2022-07-22 NOTE — Telephone Encounter (Signed)
Results released to the patient's MyChart. Also attempted to contact the patient by phone. No answer- message received stating the call could not be completed as dialed.

## 2022-07-23 NOTE — Telephone Encounter (Signed)
Should actually be a short acting diltiazem not long-acting.  [place]

## 2022-07-29 NOTE — Telephone Encounter (Signed)
Attempted to call the patient. No answer- I left a message to please call back.  

## 2022-07-30 NOTE — Telephone Encounter (Signed)
Attempted to call the patient. No answer- I left to please call back.

## 2022-08-01 NOTE — Telephone Encounter (Signed)
I spoke with the patient. I have advised her that I have confirmed with Dr. Ellyn Hack that short acting diltiazem will need to be called in for her prior to her Cardiac CT.   She advised she will call back in January to schedule as she is also having additional testing on her thyroid as well and she would like to get through Christmas.  I have advised the patient to please call back when she is ready to schedule. I can send in her correct Diltiazem RX at that time.  The patient voices understanding and is agreeable.

## 2022-09-05 ENCOUNTER — Encounter (HOSPITAL_COMMUNITY): Payer: Self-pay

## 2022-09-09 ENCOUNTER — Telehealth: Payer: Self-pay | Admitting: Cardiology

## 2022-09-09 NOTE — Telephone Encounter (Signed)
Routed to [employer]

## 2022-09-09 NOTE — Telephone Encounter (Signed)
Andrea Ford. From [employer] benefits calling needing to speak to someone clinical about a denial for a CT. Please advise.

## 2022-10-01 ENCOUNTER — Other Ambulatory Visit: Payer: Self-pay

## 2022-10-01 ENCOUNTER — Emergency Department
Admission: EM | Admit: 2022-10-01 | Discharge: 2022-10-01 | Disposition: A | Discharge status: Home / Self Care | Payer: Medicare Other | Attending: Emergency Medicine | Admitting: Emergency Medicine

## 2022-10-01 DIAGNOSIS — R21 Rash and other nonspecific skin eruption: Secondary | ICD-10-CM

## 2022-10-01 DIAGNOSIS — I10 Essential (primary) hypertension: Secondary | ICD-10-CM | POA: Insufficient documentation

## 2022-10-01 DIAGNOSIS — R197 Diarrhea, unspecified: Secondary | ICD-10-CM | POA: Insufficient documentation

## 2022-10-01 LAB — URINALYSIS, ROUTINE W REFLEX MICROSCOPIC
Bacteria, UA: NONE SEEN
Bilirubin Urine: NEGATIVE
Glucose, UA: NEGATIVE mg/dL
Ketones, ur: NEGATIVE mg/dL
Nitrite: NEGATIVE
Protein, ur: NEGATIVE mg/dL
Specific Gravity, Urine: 1.018 (ref 1.005–1.030)
pH: 5 (ref 5.0–8.0)

## 2022-10-01 LAB — COMPREHENSIVE METABOLIC PANEL
ALT: 18 U/L (ref 0–44)
AST: 23 U/L (ref 15–41)
Albumin: 4.1 g/dL (ref 3.5–5.0)
Alkaline Phosphatase: 61 U/L (ref 38–126)
Anion gap: 10 (ref 5–15)
BUN: 13 mg/dL (ref 8–23)
CO2: 27 mmol/L (ref 22–32)
Calcium: 9.2 mg/dL (ref 8.9–10.3)
Chloride: 103 mmol/L (ref 98–111)
Creatinine, Ser: 0.85 mg/dL (ref 0.44–1.00)
GFR, Estimated: >60 mL/min (ref >60)
Glucose, Bld: 116 mg/dL — ABNORMAL HIGH (ref 70–99)
Potassium: 4 mmol/L (ref 3.5–5.1)
Sodium: 140 mmol/L (ref 135–145)
Total Bilirubin: 0.8 mg/dL (ref 0.3–1.2)
Total Protein: 7.4 g/dL (ref 6.5–8.1)

## 2022-10-01 LAB — CBC
HCT: 43.8 % (ref 36.0–46.0)
Hemoglobin: 14.7 g/dL (ref 12.0–15.0)
MCH: 29.2 pg (ref 26.0–34.0)
MCHC: 33.6 g/dL (ref 30.0–36.0)
MCV: 87.1 fL (ref 80.0–100.0)
Platelets: 235 10*3/uL (ref 150–400)
RBC: 5.03 MIL/uL (ref 3.87–5.11)
RDW: 11.9 % (ref 11.5–15.5)
WBC: 5.5 10*3/uL (ref 4.0–10.5)
nRBC: 0 % (ref 0.0–0.2)

## 2022-10-01 LAB — LIPASE, BLOOD: Lipase: 38 U/L (ref 11–51)

## 2022-10-01 MED ORDER — NYSTATIN 100000 UNIT/GM EX OINT: 1.0000 | TOPICAL_OINTMENT | Freq: Two times a day (BID) | CUTANEOUS | 2 refills | Status: AC

## 2022-10-01 MED ORDER — CEPHALEXIN 500 MG PO CAPS: 500.0000 mg | ORAL_CAPSULE | Freq: Three times a day (TID) | ORAL | 0 refills | Status: DC

## 2022-10-01 NOTE — ED Triage Notes (Signed)
Pt comes with c/o rash all over belly pain and butt for over week. Pt states the last two days she has had severe diarrhea too. Pt states no pain.

## 2022-10-01 NOTE — ED Provider Notes (Signed)
[facility] Provider Note    Event Date/Time   First MD Initiated Contact with Patient 10/01/22 1545     (approximate)   History   Chief Complaint: Rash and Diarrhea   HPI  Andrea Ford is a 72 y.o. female with past history of hypertension, GERD, anxiety, eczema who comes ED complaining of itchy rash all over the abdomen and bilateral hips over the past week.  Initially went to dermatology and was told it was for folliculitis, given topical clindamycin and oral doxycycline, but rash has not improved over the past several days of treatment.  Denies any chest pain shortness of breath or fever.  She did start developing diarrhea after taking the doxycycline, but it is not black or bloody and she is tolerating oral intake okay.  Patient notes that it seems to get worse with heat     Physical Exam   Triage Vital Signs: ED Triage Vitals  Enc Vitals Group     BP 10/01/22 1331 (!) 157/89     Pulse Rate 10/01/22 1331 (!) 104     Resp 10/01/22 1331 18     Temp 10/01/22 1331 98 F (36.7 C)     Temp src --      SpO2 10/01/22 1331 98 %     Weight --      Height --      Head Circumference --      Peak Flow --      Pain Score 10/01/22 1330 4     Pain Loc --      Pain Edu? --      Excl. in [place]? --     Most recent vital signs: Vitals:   10/01/22 1331  BP: (!) 157/89  Pulse: (!) 104  Resp: 18  Temp: 98 F (36.7 C)  SpO2: 98%    General: Awake, no distress.  CV:  Good peripheral perfusion.  Resp:  Normal effort.  Abd:  No distention.  Soft nontender Other:  Sparse papular rash over the anterior abdomen, most concentrated over the upper abdomen.  No scaly patch of candidiasis.  No confluent areas, no crepitus or fluctuance or frank cellulitis.  No open wounds.  No lymphangitis.  Lesions blanch.  No blistering or drainage.   ED Results / Procedures / Treatments   Labs (all labs ordered are listed, but only abnormal results are displayed) Labs  Reviewed  COMPREHENSIVE METABOLIC PANEL - Abnormal; Notable for the following components:      Result Value   Glucose, Bld 116 (*)    All other components within normal limits  URINALYSIS, ROUTINE W REFLEX MICROSCOPIC - Abnormal; Notable for the following components:   Color, Urine YELLOW (*)    APPearance HAZY (*)    Hgb urine dipstick MODERATE (*)    Leukocytes,Ua TRACE (*)    All other components within normal limits  LIPASE, BLOOD  CBC     EKG    RADIOLOGY    PROCEDURES:  Procedures   MEDICATIONS ORDERED IN ED: Medications - No data to display   IMPRESSION / MDM / [place] / ED COURSE  I reviewed the triage vital signs and the nursing notes.  DDx: Folliculitis, candidiasis, cutaneous strep infection, heat rash  Patient's presentation is most consistent with acute illness / injury with system symptoms.  Patient presents with itchy papular rash over the upper abdomen.  Her current working diagnosis of folliculitis is congruent, possibly also an atypical  strep or candidal infection.  Since she has not seen any improvement with her current antibiotic regimen, will do a trial of Keflex for better strep coverage and topical nystatin.  Vitals are unremarkable, doubt sepsis or intra-abdominal infection.  Labs are all normal.  Stable for discharge       FINAL CLINICAL IMPRESSION(S) / ED DIAGNOSES   Final diagnoses:  Rash     Rx / DC Orders   ED Discharge Orders          Ordered    cephALEXin (KEFLEX) 500 MG capsule  3 times daily        10/01/22 1639    nystatin ointment (MYCOSTATIN)  2 times daily        10/01/22 1639             Note:  This document was prepared using Dragon voice recognition software and may include unintentional dictation errors.   Carrie Mew, MD 10/01/22 [phone]

## 2022-10-10 ENCOUNTER — Ambulatory Visit: Payer: Medicare Other | Attending: Cardiology | Admitting: Cardiology

## 2022-10-10 ENCOUNTER — Encounter: Payer: Self-pay | Admitting: Cardiology

## 2022-10-12 ENCOUNTER — Encounter (HOSPITAL_COMMUNITY): Payer: Self-pay

## 2023-07-15 ENCOUNTER — Emergency Department
Admission: EM | Admit: 2023-07-15 | Discharge: 2023-07-15 | Disposition: A | Discharge status: Home / Self Care | Payer: Medicare Other | Attending: Emergency Medicine | Admitting: Emergency Medicine

## 2023-07-15 DIAGNOSIS — R1032 Left lower quadrant pain: Secondary | ICD-10-CM | POA: Diagnosis present

## 2023-07-15 DIAGNOSIS — N390 Urinary tract infection, site not specified: Secondary | ICD-10-CM | POA: Diagnosis not present

## 2023-07-15 LAB — URINALYSIS, ROUTINE W REFLEX MICROSCOPIC
Bacteria, UA: NONE SEEN
Bilirubin Urine: NEGATIVE
Glucose, UA: NEGATIVE mg/dL
Ketones, ur: NEGATIVE mg/dL
Leukocytes,Ua: NEGATIVE
Nitrite: NEGATIVE
Protein, ur: NEGATIVE mg/dL
Specific Gravity, Urine: 1.001 — ABNORMAL LOW (ref 1.005–1.030)
pH: 6 (ref 5.0–8.0)

## 2023-07-15 LAB — COMPREHENSIVE METABOLIC PANEL
ALT: 16 U/L (ref 0–44)
AST: 21 U/L (ref 15–41)
Albumin: 4.4 g/dL (ref 3.5–5.0)
Alkaline Phosphatase: 62 U/L (ref 38–126)
Anion gap: 8 (ref 5–15)
BUN: 14 mg/dL (ref 8–23)
CO2: 25 mmol/L (ref 22–32)
Calcium: 9.3 mg/dL (ref 8.9–10.3)
Chloride: 105 mmol/L (ref 98–111)
Creatinine, Ser: 0.8 mg/dL (ref 0.44–1.00)
GFR, Estimated: >60 mL/min (ref >60)
Glucose, Bld: 121 mg/dL — ABNORMAL HIGH (ref 70–99)
Potassium: 3.9 mmol/L (ref 3.5–5.1)
Sodium: 138 mmol/L (ref 135–145)
Total Bilirubin: 1 mg/dL (ref <1.2)
Total Protein: 7.7 g/dL (ref 6.5–8.1)

## 2023-07-15 LAB — CBC
HCT: 43.9 % (ref 36.0–46.0)
Hemoglobin: 15 g/dL (ref 12.0–15.0)
MCH: 29.6 pg (ref 26.0–34.0)
MCHC: 34.2 g/dL (ref 30.0–36.0)
MCV: 86.8 fL (ref 80.0–100.0)
Platelets: 220 10*3/uL (ref 150–400)
RBC: 5.06 MIL/uL (ref 3.87–5.11)
RDW: 12.1 % (ref 11.5–15.5)
WBC: 5.5 10*3/uL (ref 4.0–10.5)
nRBC: 0 % (ref 0.0–0.2)

## 2023-07-15 LAB — LIPASE, BLOOD: Lipase: 33 U/L (ref 11–51)

## 2023-07-15 MED ORDER — CEPHALEXIN 500 MG PO CAPS: 500.0000 mg | ORAL_CAPSULE | Freq: Three times a day (TID) | ORAL | 0 refills | Status: AC

## 2023-07-15 NOTE — ED Provider Notes (Signed)
[facility] Provider Note    Event Date/Time   First MD Initiated Contact with Patient 07/15/23 1308     (approximate)   History   Abdominal Pain   HPI  Andrea Ford is a 72 y.o. female who presents with complaints of lower abdominal pain and urinary frequency.  She reports she has had months of left-sided abdominal pain which she has attributed to diverticulosis.  More recently she is developed urinary frequency and she has to rush to the toilet frequently.  She does not have dysuria or fevers.  No back pain.     Physical Exam   Triage Vital Signs: ED Triage Vitals [07/15/23 1233]  Encounter Vitals Group     BP (!) 154/90     Systolic BP Percentile      Diastolic BP Percentile      Pulse Rate 98     Resp 18     Temp 98.4 F (36.9 C)     Temp src      SpO2 96 %     Weight 68.5 kg (151 lb)     Height 1.549 m (5\' 1" )     Head Circumference      Peak Flow      Pain Score 8     Pain Loc      Pain Education      Exclude from Growth Chart     Most recent vital signs: Vitals:   07/15/23 1233  BP: (!) 154/90  Pulse: 98  Resp: 18  Temp: 98.4 F (36.9 C)  SpO2: 96%     General: Awake, no distress.  CV:  Good peripheral perfusion.  Resp:  Normal effort.  Abd:  No distention.  Soft, nontender reassuring exam Other:     ED Results / Procedures / Treatments   Labs (all labs ordered are listed, but only abnormal results are displayed) Labs Reviewed  COMPREHENSIVE METABOLIC PANEL - Abnormal; Notable for the following components:      Result Value   Glucose, Bld 121 (*)    All other components within normal limits  URINALYSIS, ROUTINE W REFLEX MICROSCOPIC - Abnormal; Notable for the following components:   Color, Urine COLORLESS (*)    APPearance CLEAR (*)    Specific Gravity, Urine 1.001 (*)    Hgb urine dipstick SMALL (*)    All other components within normal limits  LIPASE, BLOOD  CBC      EKG     RADIOLOGY     PROCEDURES:  Critical Care performed:   Procedures   MEDICATIONS ORDERED IN ED: Medications - No data to display   IMPRESSION / MDM / ASSESSMENT AND PLAN / ED COURSE  I reviewed the triage vital signs and the nursing notes. Patient's presentation is most consistent with acute presentation with potential threat to life or bodily function.  Patient presents with abdominal pain as detailed above, differential includes UTI, cystitis, diverticulitis  Lab work reviewed and overall quite reassuring, no tenderness to palpation on exam, normal CBC, given urinary frequency we will treat presumptively for UTI though interstitial cystitis could have similar symptoms.  No indication for CT imaging or other imaging at this time, will refer to urology and patient requests referral to dermatology for intermittent rash as well        FINAL CLINICAL IMPRESSION(S) / ED DIAGNOSES   Final diagnoses:  Lower urinary tract infectious disease     Rx / DC Orders   ED  Discharge Orders          Ordered    cephALEXin (KEFLEX) 500 MG capsule  3 times daily        07/15/23 1328    Ambulatory referral to Dermatology        07/15/23 1336    Ambulatory referral to Urology        07/15/23 1336             Note:  This document was prepared using Dragon voice recognition software and may include unintentional dictation errors.   Jene Every, MD 07/15/23 [phone]

## 2023-07-15 NOTE — ED Notes (Signed)
See triage notes. Patient c/o LUQ abdominal pain, urinary incontinence, and N/D. Hx of diverticulosis.

## 2023-07-15 NOTE — ED Triage Notes (Signed)
Pt presents to the ED via POV from home for LUQ abdominal pain, urinary incontinence, nausea, and diarrhea. Pt states that she has a hx of diverticulosis. Pt reports urinary frequency and urgency. Pt reports abdominal bloating as well. Pt A&Ox4 at time of triage. VSS.

## 2023-08-20 ENCOUNTER — Ambulatory Visit: Payer: Medicare Other | Admitting: Urology

## 2023-08-22 ENCOUNTER — Encounter: Payer: Self-pay | Admitting: Urology

## 2023-10-17 ENCOUNTER — Telehealth (INDEPENDENT_AMBULATORY_CARE_PROVIDER_SITE_OTHER): Payer: Self-pay

## 2023-10-17 NOTE — Telephone Encounter (Signed)
 Patient left a message on nurse line. I left a message on patient voicemail to return call to the office.

## 2023-10-20 ENCOUNTER — Telehealth (INDEPENDENT_AMBULATORY_CARE_PROVIDER_SITE_OTHER): Payer: Self-pay | Admitting: Nurse Practitioner

## 2023-10-20 NOTE — Telephone Encounter (Signed)
 Patient called stating she was returning nurse call. States she just wanted to make appointment. LS 06/2022. Scheduled pt for LE Reflux + consult w/FB. VV with pain.

## 2023-10-29 ENCOUNTER — Ambulatory Visit: Payer: Medicare Other | Admitting: Dermatology

## 2023-11-19 ENCOUNTER — Other Ambulatory Visit (INDEPENDENT_AMBULATORY_CARE_PROVIDER_SITE_OTHER): Payer: Self-pay | Admitting: Nurse Practitioner

## 2023-11-19 DIAGNOSIS — I83819 Varicose veins of unspecified lower extremities with pain: Secondary | ICD-10-CM

## 2023-11-20 ENCOUNTER — Ambulatory Visit (INDEPENDENT_AMBULATORY_CARE_PROVIDER_SITE_OTHER): Admitting: Nurse Practitioner

## 2023-11-20 ENCOUNTER — Ambulatory Visit (INDEPENDENT_AMBULATORY_CARE_PROVIDER_SITE_OTHER)

## 2023-11-20 ENCOUNTER — Encounter (INDEPENDENT_AMBULATORY_CARE_PROVIDER_SITE_OTHER): Payer: Self-pay | Admitting: Nurse Practitioner

## 2023-11-20 VITALS — BP 167/91 | HR 91 | Resp 18 | Ht 61.0 in | Wt 153.8 lb

## 2023-11-20 DIAGNOSIS — I83819 Varicose veins of unspecified lower extremities with pain: Secondary | ICD-10-CM

## 2023-11-20 DIAGNOSIS — I10 Essential (primary) hypertension: Secondary | ICD-10-CM | POA: Diagnosis not present

## 2023-11-20 DIAGNOSIS — I83812 Varicose veins of left lower extremities with pain: Secondary | ICD-10-CM

## 2023-11-20 DIAGNOSIS — I8312 Varicose veins of left lower extremity with inflammation: Secondary | ICD-10-CM | POA: Diagnosis not present

## 2023-11-23 ENCOUNTER — Encounter (INDEPENDENT_AMBULATORY_CARE_PROVIDER_SITE_OTHER): Payer: Self-pay | Admitting: Nurse Practitioner

## 2023-11-23 NOTE — Progress Notes (Signed)
 Subjective:    Patient ID: Andrea Ford, female    DOB: 01-07-1951, 73 y.o.   MRN: [id number] Chief Complaint  Patient presents with   Follow-up    fu LS 06/2022 LE Reflux + consult. VV w/pain.    The patient presents today for evaluation of her varicose veins.  She notes that she has pain and discomfort in her left lower extremity.  She is underwent sclerotherapy before but it has not really helped any of the pain or discomfort in her leg.  She notes that she has some heaviness and some tenderness.  She also notes that it gets very tight and swollen in the calf area.  She utilizes medical grade compression socks and it does help the symptoms somewhat but it does not relieve them.  She denies any current open wounds or ulcerations.  Today noninvasive studies show reflux present at the left great saphenous vein at the saphenofemoral junction.  No evidence of DVT or deep venous insufficiency noted.    Review of Systems  Cardiovascular:  Positive for leg swelling.  All other systems reviewed and are negative.      Objective:   Physical Exam Vitals reviewed.  HENT:     Head: Normocephalic.  Cardiovascular:     Rate and Rhythm: Normal rate.     Pulses: Normal pulses.  Pulmonary:     Effort: Pulmonary effort is normal.  Skin:    General: Skin is warm and dry.  Neurological:     Mental Status: She is alert and oriented to person, place, and time.  Psychiatric:        Mood and Affect: Mood normal.        Behavior: Behavior normal.        Thought Content: Thought content normal.        Judgment: Judgment normal.     BP (!) 167/91   Pulse 91   Resp 18   Ht 5\' 1"  (1.549 m)   Wt 153 lb 12.8 oz (69.8 kg)   BMI 29.06 kg/m   Past Medical History:  Diagnosis Date   Asthma    COPD (chronic obstructive pulmonary disease) (HCC)    Hypertension    Migraine    Symptomatic varicose veins, bilateral    With multiple intervention is now with if sense of swelling and stasis  changes.    Social History   Socioeconomic History   Marital status: Divorced    Spouse name: Not on file   Number of children: Not on file   Years of education: Not on file   Highest education level: Not on file  Occupational History   Not on file  Tobacco Use   Smoking status: Every Day   Smokeless tobacco: Never  Vaping Use   Vaping status: Never Used  Substance and Sexual Activity   Alcohol use: No   Drug use: No   Sexual activity: Not on file  Other Topics Concern   Not on file  Social History Narrative   Not on file   Social Drivers of Health   Financial Resource Strain: Medium Risk (04/21/2023)   Received from [facility]   Overall Financial Resource Strain (CARDIA)    Difficulty of Paying Living Expenses: Somewhat hard  Food Insecurity: No Food Insecurity (04/21/2023)   Received from [facility]   Hunger Vital Sign    Worried About Running Out of Food in the Last Year: Never true    Ran Out  of Food in the Last Year: Never true  Transportation Needs: No Transportation Needs (04/21/2023)   Received from [facility] - Transportation    Lack of Transportation (Medical): No    Lack of Transportation (Non-Medical): No  Physical Activity: Inactive (07/14/2018)   Received from [facility], [facility]   Exercise Vital Sign    Days of Exercise per Week: 0 days    Minutes of Exercise per Session: 0 min  Stress: Stress Concern Present (07/14/2018)   Received from [facility], [facility]ational [facility] - Occupational Stress Questionnaire    Feeling of Stress : Rather much  Social Connections: Unknown (07/14/2018)   Received from [facility], [facility]   Social Connection and Isolation Panel [NHANES]    Frequency of Communication with Friends and Family: More than three times a week    Frequency of Social Gatherings with Friends and Family: More than three times a week    Attends Religious  Services: Not on file    Active Member of Clubs or Organizations: Not on file    Attends Banker Meetings: Not on file    Marital Status: Not on file  Intimate Partner Violence: Not At Risk (04/03/2022)   Received from [facility], [facility]   Humiliation, Afraid, Rape, and Kick questionnaire    Fear of Current or Ex-Partner: No    Emotionally Abused: No    Physically Abused: No    Sexually Abused: No    Past Surgical History:  Procedure Laterality Date   ANKLE SURGERY Right    CARDIAC CATHETERIZATION Right 07/15/2016   Procedure: Left Heart Cath and Coronary Angiography;  Surgeon: Cherrie Cornwall, MD;  Location: [facility] INVASIVE CV LAB;  Service: Cardiovascular;  Laterality: Right;   CHOLECYSTECTOMY     TUBAL LIGATION     Venous Sclerotherapy Bilateral    Lower extremities-[facility].    Family History  Problem Relation Age of Onset   Hypertension Mother    Migraines Mother    Cancer Mother    Varicose Veins Mother    Coronary artery disease Father 70       Died prior to CABG today 57   Arthritis Father    Hypertension Father    Heart attack Father 37       Age 72 and 23   Diabetes Mellitus I Brother    Stroke Maternal Grandmother 68   Stroke Maternal Grandfather 68   Stroke Paternal Grandmother 74   Stroke Paternal Grandfather    Diabetes Maternal Uncle    Coronary artery disease Paternal Uncle 81       CABG    Allergies  Allergen Reactions   Prednisone Rash and Other (See Comments)    hyper    Diphenhydramine Hcl Other (See Comments)   Metoprolol     Other reaction(s): Hives (161096045) Other reaction(s): Hives (409811914)    Other Other (See Comments)    Pet dander   Oxycodone-Acetaminophen    Codeine Itching and Other (See Comments)   Diphenhydramine Anxiety   Etodolac Rash, Nausea And Vomiting and Other (See Comments)   Hydrocodone Itching   Meperidine Rash and Other (See Comments)    Pt. Not aware  Of ever having  any demerol    Venlafaxine Rash and Other (See Comments)       Latest Ref Rng & Units 07/15/2023  12:37 PM 10/01/2022    1:32 PM 06/10/2022    9:19 PM  CBC  WBC 4.0 - 10.5 K/uL 5.5  5.5  5.8   Hemoglobin 12.0 - 15.0 g/dL 16.1  09.6  04.5   Hematocrit 36.0 - 46.0 % 43.9  43.8  42.8   Platelets 150 - 400 K/uL 220  235  250       CMP     Component Value Date/Time   NA 138 07/15/2023 1237   NA 142 07/12/2022 1506   NA 141 11/02/2014 0937   K 3.9 07/15/2023 1237   K 4.0 11/02/2014 0937   CL 105 07/15/2023 1237   CL 107 11/02/2014 0937   CO2 25 07/15/2023 1237   CO2 26 11/02/2014 0937   GLUCOSE 121 (H) 07/15/2023 1237   GLUCOSE 124 (H) 11/02/2014 0937   BUN 14 07/15/2023 1237   BUN 15 07/12/2022 1506   BUN 16 11/02/2014 0937   CREATININE 0.80 07/15/2023 1237   CREATININE 0.70 11/02/2014 0937   CALCIUM 9.3 07/15/2023 1237   CALCIUM 9.2 11/02/2014 0937   PROT 7.7 07/15/2023 1237   PROT 7.7 09/14/2014 1104   ALBUMIN 4.4 07/15/2023 1237   ALBUMIN 3.8 09/14/2014 1104   AST 21 07/15/2023 1237   AST 28 09/14/2014 1104   ALT 16 07/15/2023 1237   ALT 32 09/14/2014 1104   ALKPHOS 62 07/15/2023 1237   ALKPHOS 72 09/14/2014 1104   BILITOT 1.0 07/15/2023 1237   BILITOT 0.5 09/14/2014 1104   EGFR 79 07/12/2022 1506   GFRNONAA >60 07/15/2023 1237   GFRNONAA >60 11/02/2014 0937     No results found.     Assessment & Plan:   1. Varicose veins of left lower extremity with inflammation (Primary) I had a long discussion with the patient regarding her test results as well as varicose veins and pain and discomfort in the lower extremity.  Her pain certainly can be attributed to varicose veins, however she has significant issues with arthralgias as well as fibromyalgia and this could be what her pain is attributed to as well.  The patient would like time to consider whether she would like to move forward with just conservative therapy or undergoing endovenous laser ablation.  The  patient want to consider and she will contact us  if she wishes to move forward.  2. Essential hypertension Continue antihypertensive medications as already ordered, these medications have been reviewed and there are no changes at this time.   Current Outpatient Medications on File Prior to Visit  Medication Sig Dispense Refill   acetaminophen (TYLENOL) 650 MG CR tablet Take by mouth.     albuterol (PROVENTIL HFA;VENTOLIN HFA) 108 (90 BASE) MCG/ACT inhaler Inhale into the lungs every 6 (six) hours as needed for wheezing or shortness of breath.     albuterol (PROVENTIL) (2.5 MG/3ML) 0.083% nebulizer solution Take 3 mLs (2.5 mg total) by nebulization every 6 (six) hours as needed for wheezing or shortness of breath. 75 mL 0   atenolol (TENORMIN) 25 MG tablet Take 25 mg by mouth daily.     atorvastatin (LIPITOR) 40 MG tablet Take 1 tablet (40 mg total) by mouth daily at 6 PM. (Patient taking differently: Take 20 mg by mouth daily.) 30 tablet 2   busPIRone (BUSPAR) 10 MG tablet      Cholecalciferol (VITAMIN D-3 PO) Take 1 tablet by mouth daily.     citalopram (CELEXA) 20 MG tablet Take 20 mg by mouth daily.  clonazePAM (KLONOPIN) 0.5 MG tablet Take 0.25 mg by mouth 2 (two) times daily as needed for anxiety.     dicyclomine (BENTYL) 20 MG tablet Take 20 mg by mouth daily as needed for spasms.     diltiazem (DILACOR XR) 180 MG 24 hr capsule Take 1 capsule (180 mg total) by mouth daily. Take 2 hour prior to Coronary CTA. One time only 1 capsule 0   famotidine (PEPCID) 20 MG tablet Take by mouth.     Fluocinolone Acetonide Scalp 0.01 % OIL Apply topically.     fluticasone (FLONASE) 50 MCG/ACT nasal spray 1 spray into each nostril daily as needed for allergies.     losartan (COZAAR) 100 MG tablet Take 1 tablet by mouth daily.     meloxicam (MOBIC) 15 MG tablet Take 15 mg by mouth daily.     Multiple Vitamin (MULTIVITAMIN) capsule Take by mouth.     nystatin ointment (MYCOSTATIN) Apply 1  Application topically 2 (two) times daily. 30 g 2   rizatriptan (MAXALT-MLT) 10 MG disintegrating tablet Take 10 mg by mouth daily as needed.     sodium chloride 0.9 % nebulizer solution      traZODone (DESYREL) 50 MG tablet Take 50 mg by mouth at bedtime as needed for sleep.     triamcinolone ointment (KENALOG) 0.1 % APPLY  OINTMENT TOPICALLY TO AFFECTED AREA TWICE DAILY     famotidine (PEPCID) 40 MG tablet Take 1 tablet (40 mg total) by mouth at bedtime. 30 tablet 1   No current facility-administered medications on file prior to visit.    There are no Patient Instructions on file for this visit. No follow-ups on file.   Rexanna Louthan E Race Latour, NP

## 2023-12-18 ENCOUNTER — Telehealth (INDEPENDENT_AMBULATORY_CARE_PROVIDER_SITE_OTHER): Payer: Self-pay | Admitting: Nurse Practitioner

## 2023-12-18 NOTE — Telephone Encounter (Signed)
 LVM for pt advising of where she is on the list patients waiting for laser ablations. I advised to call back with any questions.

## 2023-12-30 ENCOUNTER — Encounter (INDEPENDENT_AMBULATORY_CARE_PROVIDER_SITE_OTHER): Payer: Self-pay

## 2024-01-06 ENCOUNTER — Telehealth (INDEPENDENT_AMBULATORY_CARE_PROVIDER_SITE_OTHER): Payer: Self-pay | Admitting: Vascular Surgery

## 2024-01-06 NOTE — Telephone Encounter (Signed)
 LVM for pt TCB and schedule laser ablation appt  left leg GSV laser. see gs. auth # [id number] exp: 5.21.25 - 7.21.25   1 week post left leg GSV laser   4 week post left leg GSV laser. see gs/fb

## 2024-01-16 ENCOUNTER — Telehealth (INDEPENDENT_AMBULATORY_CARE_PROVIDER_SITE_OTHER): Payer: Self-pay | Admitting: Vascular Surgery

## 2024-01-16 NOTE — Telephone Encounter (Signed)
 LVM for pt TCB and schedule laser ablation appt   left leg GSV laser. see gs. auth # [id number] exp: 5.21.25 - 7.21.25    1 week post left leg GSV laser    4 week post left leg GSV laser. see gs/fb      I did advise that we are running out of space on Dr. Delphia Ficks schedule for the laser procedure. The expiration is through 7.21.25 and we need to get her on the schedule or we may have to do another PA.

## 2024-01-29 ENCOUNTER — Telehealth (INDEPENDENT_AMBULATORY_CARE_PROVIDER_SITE_OTHER): Payer: Self-pay | Admitting: Vascular Surgery

## 2024-01-29 ENCOUNTER — Other Ambulatory Visit (INDEPENDENT_AMBULATORY_CARE_PROVIDER_SITE_OTHER): Payer: Self-pay

## 2024-01-29 MED ORDER — ALPRAZOLAM 0.5 MG PO TABS: ORAL_TABLET | ORAL | 0 refills | Status: AC

## 2024-01-29 NOTE — Telephone Encounter (Signed)
 sent

## 2024-01-29 NOTE — Telephone Encounter (Signed)
 Pt is scheduled for a left leg GSV laser ablation with Dr. Prescilla Brod on 7.21.25. Pt will need the standard RX protocol called in to [facility] on [address] in [place]. Thank you

## 2024-02-12 NOTE — Telephone Encounter (Signed)
 error

## 2024-02-28 NOTE — Progress Notes (Unsigned)
    MRN : [id number]  Andrea Ford is a 73 y.o. ([birth year]) female who presents with chief complaint of No chief complaint on file. .    The patient's left lower extremity was sterilely prepped and draped.  The ultrasound machine was used to visualize the left great saphenous vein throughout its course.  A segment below the knee was selected for access.  The saphenous vein was accessed without difficulty using ultrasound guidance with a micropuncture needle.   An 0.018  wire was placed beyond the saphenofemoral junction through the sheath and the microneedle was removed.  The 65 cm sheath was then placed over the wire and the wire and dilator were removed.  The laser fiber was placed through the sheath and its tip was placed approximately 2 cm below the saphenofemoral junction.  Tumescent anesthesia was then created with a dilute lidocaine  solution.  Laser energy was then delivered with constant withdrawal of the sheath and laser fiber.  Approximately 1837 Joules of energy were delivered over a length of 40 cm.  Sterile dressings were placed.  The patient tolerated the procedure well without complications.

## 2024-03-01 ENCOUNTER — Ambulatory Visit (INDEPENDENT_AMBULATORY_CARE_PROVIDER_SITE_OTHER): Admitting: Vascular Surgery

## 2024-03-01 ENCOUNTER — Encounter (INDEPENDENT_AMBULATORY_CARE_PROVIDER_SITE_OTHER): Payer: Self-pay | Admitting: Vascular Surgery

## 2024-03-01 ENCOUNTER — Telehealth (INDEPENDENT_AMBULATORY_CARE_PROVIDER_SITE_OTHER): Payer: Self-pay | Admitting: Vascular Surgery

## 2024-03-01 VITALS — BP 149/81 | HR 81 | Resp 16 | Ht 61.0 in | Wt 155.0 lb

## 2024-03-01 DIAGNOSIS — I8312 Varicose veins of left lower extremity with inflammation: Secondary | ICD-10-CM | POA: Diagnosis not present

## 2024-03-01 DIAGNOSIS — I8311 Varicose veins of right lower extremity with inflammation: Secondary | ICD-10-CM

## 2024-03-01 NOTE — Telephone Encounter (Signed)
 LVM for pt TCB and see if she can move up laser ablation appt to 2:00 pm  today, 7.21.25.

## 2024-03-02 ENCOUNTER — Emergency Department

## 2024-03-02 ENCOUNTER — Emergency Department
Admission: EM | Admit: 2024-03-02 | Discharge: 2024-03-02 | Disposition: A | Discharge status: Home / Self Care | Attending: Emergency Medicine | Admitting: Emergency Medicine

## 2024-03-02 ENCOUNTER — Encounter: Payer: Self-pay | Admitting: Emergency Medicine

## 2024-03-02 ENCOUNTER — Other Ambulatory Visit: Payer: Self-pay

## 2024-03-02 DIAGNOSIS — J449 Chronic obstructive pulmonary disease, unspecified: Secondary | ICD-10-CM | POA: Diagnosis not present

## 2024-03-02 DIAGNOSIS — M79652 Pain in left thigh: Secondary | ICD-10-CM | POA: Diagnosis present

## 2024-03-02 DIAGNOSIS — I10 Essential (primary) hypertension: Secondary | ICD-10-CM | POA: Insufficient documentation

## 2024-03-02 DIAGNOSIS — I809 Phlebitis and thrombophlebitis of unspecified site: Secondary | ICD-10-CM | POA: Insufficient documentation

## 2024-03-02 LAB — BASIC METABOLIC PANEL WITH GFR
Anion gap: 11 (ref 5–15)
BUN: 16 mg/dL (ref 8–23)
CO2: 23 mmol/L (ref 22–32)
Calcium: 8.9 mg/dL (ref 8.9–10.3)
Chloride: 105 mmol/L (ref 98–111)
Creatinine, Ser: 0.73 mg/dL (ref 0.44–1.00)
GFR, Estimated: >60 mL/min (ref >60)
Glucose, Bld: 91 mg/dL (ref 70–99)
Potassium: 3.9 mmol/L (ref 3.5–5.1)
Sodium: 139 mmol/L (ref 135–145)

## 2024-03-02 LAB — CBC WITH DIFFERENTIAL/PLATELET
Abs Immature Granulocytes: 0.02 K/uL (ref 0.00–0.07)
Basophils Absolute: 0.1 K/uL (ref 0.0–0.1)
Basophils Relative: 1 %
Eosinophils Absolute: 0.2 K/uL (ref 0.0–0.5)
Eosinophils Relative: 3 %
HCT: 44 % (ref 36.0–46.0)
Hemoglobin: 14.6 g/dL (ref 12.0–15.0)
Immature Granulocytes: 0 %
Lymphocytes Relative: 21 %
Lymphs Abs: 1.4 K/uL (ref 0.7–4.0)
MCH: 30.2 pg (ref 26.0–34.0)
MCHC: 33.2 g/dL (ref 30.0–36.0)
MCV: 90.9 fL (ref 80.0–100.0)
Monocytes Absolute: 0.5 K/uL (ref 0.1–1.0)
Monocytes Relative: 8 %
Neutro Abs: 4.4 K/uL (ref 1.7–7.7)
Neutrophils Relative %: 67 %
Platelets: 224 K/uL (ref 150–400)
RBC: 4.84 MIL/uL (ref 3.87–5.11)
RDW: 11.9 % (ref 11.5–15.5)
WBC: 6.5 K/uL (ref 4.0–10.5)
nRBC: 0 % (ref 0.0–0.2)

## 2024-03-02 NOTE — ED Triage Notes (Signed)
 Pt had Varicose vein ablation on 03/01/24 by Schnier and today pt noted redness and bruising above bandage site. Area is hot to the touch without streaking.

## 2024-03-02 NOTE — ED Provider Notes (Signed)
 [facility] Provider Note    Event Date/Time   First MD Initiated Contact with Patient 03/02/24 2005     (approximate)   History   Post-op Problem   HPI  Andrea Ford is a 74 y.o. female with a history of COPD, hypertension, migraines, osteoarthritis, and anxiety who presents with an area of redness to her left inner thigh acute onset today after procedure yesterday.  The patient had a varicose vein laser ablation done by Dr. Jama yesterday.  She took the bandage off this evening after 24 hours as instructed, and found that there was an area of redness and slight warmth to her upper inner thigh.  She denies any swelling to the leg.  She has no other acute pain or skin lesions.  She has no fever or chills.  I reviewed the past medical records and confirmed the encounter with Dr. Jama on 7/21 for the laser ablation.  The patient tolerated the procedure without complications.   Physical Exam   Triage Vital Signs: ED Triage Vitals  Encounter Vitals Group     BP 03/02/24 2000 (!) 167/93     Girls Systolic BP Percentile --      Girls Diastolic BP Percentile --      Boys Systolic BP Percentile --      Boys Diastolic BP Percentile --      Pulse Rate 03/02/24 2000 86     Resp 03/02/24 2000 19     Temp 03/02/24 2000 97.9 F (36.6 C)     Temp Source 03/02/24 2000 Oral     SpO2 03/02/24 2000 100 %     Weight 03/02/24 2001 154 lb 15.7 oz (70.3 kg)     Height 03/02/24 2001 5' 1 (1.549 m)     Head Circumference --      Peak Flow --      Pain Score 03/02/24 2000 6     Pain Loc --      Pain Education --      Exclude from Growth Chart --     Most recent vital signs: Vitals:   03/02/24 2000  BP: (!) 167/93  Pulse: 86  Resp: 19  Temp: 97.9 F (36.6 C)  SpO2: 100%     General: Awake, no distress.  CV:  Good peripheral perfusion.  Resp:  Normal effort.  Abd:  No distention. Other:  No significant swelling to the left leg.  An approximately  10 x 4 cm area of erythema, induration, and slight warmth to the proximal medial left thigh.  No significant tenderness.  Full range of motion at the knee.   ED Results / Procedures / Treatments   Labs (all labs ordered are listed, but only abnormal results are displayed) Labs Reviewed  BASIC METABOLIC PANEL WITH GFR  CBC WITH DIFFERENTIAL/PLATELET     EKG    RADIOLOGY  US  venous LLE: No acute DVT  PROCEDURES:  Critical Care performed: No  Procedures   MEDICATIONS ORDERED IN ED: Medications - No data to display   IMPRESSION / MDM / ASSESSMENT AND PLAN / ED COURSE  I reviewed the triage vital signs and the nursing notes.  73 year old female with PMH as noted above presents with an area of redness and slight warmth to the skin of her left thigh after a laser ablation for varicose veins performed by vascular surgery yesterday.  Differential diagnosis includes, but is not limited to, superficial thrombophlebitis, cellulitis, contact dermatitis, less likely  DVT.  I have a low suspicion for cellulitis since the access point for the procedure was in the calf.  There are no open wounds to the area.  Patient's presentation is most consistent with acute complicated illness / injury requiring diagnostic workup.  CBC shows no leukocytosis.  BMP is unremarkable.  I consulted and discussed the case with Dr. Marea from vascular surgery who recommended getting an ultrasound to rule out DVT.  If this is negative he recommends discharge home with supportive care.   ----------------------------------------- 11:24 PM on 03/02/2024 -----------------------------------------  Ultrasound is negative.  I have counseled the patient on the results of the workup and plan of care.  She will call Dr. Kathryn office tomorrow.  I gave strict return precautions, and she expressed understanding.  FINAL CLINICAL IMPRESSION(S) / ED DIAGNOSES   Final diagnoses:  Thrombophlebitis     Rx / DC Orders    ED Discharge Orders     None        Note:  This document was prepared using Dragon voice recognition software and may include unintentional dictation errors.   Jacolyn Pae, MD 03/02/24 2325

## 2024-03-02 NOTE — Discharge Instructions (Signed)
 You likely have what is called thrombophlebitis which is some inflammation in the superficial vein near the skin.  There are no signs of a blood clot in the deeper veins on the ultrasound.  Your blood work is reassuring.  Call Dr. Kathryn office tomorrow to arrange for follow-up so that he can monitor your symptoms.  Return to the ER for new, worsening, or persistent severe redness, spreading of the redness or pain to a larger area, swelling in the leg, fever or chills, or any other new or worsening symptoms that concern you.

## 2024-03-03 ENCOUNTER — Other Ambulatory Visit (INDEPENDENT_AMBULATORY_CARE_PROVIDER_SITE_OTHER): Payer: Self-pay | Admitting: Nurse Practitioner

## 2024-03-03 ENCOUNTER — Encounter (INDEPENDENT_AMBULATORY_CARE_PROVIDER_SITE_OTHER): Payer: Self-pay

## 2024-03-03 ENCOUNTER — Telehealth (INDEPENDENT_AMBULATORY_CARE_PROVIDER_SITE_OTHER): Payer: Self-pay

## 2024-03-03 MED ORDER — DOXYCYCLINE HYCLATE 100 MG PO CAPS: 100.0000 mg | ORAL_CAPSULE | Freq: Two times a day (BID) | ORAL | 0 refills | Status: AC

## 2024-03-03 NOTE — Telephone Encounter (Signed)
 I had a very long discussion with the patient this morning as well as via MyChart regarding her symptoms.  Unfortunately, it is common that when patients go to the emergency room following this procedure this initially presents as a thrombophlebitis however her symptoms are consistent with what is seen post endovenous laser ablation.  We have administered very high frequency laser to the great saphenous vein and that typically results in pain, redness swelling and significant inflammation which presents as redness and tenderness on the outside of the skin.  Based on the pictures that I have viewed today her redness is consistent with what is typically seen post endovenous laser ablation.  She is significantly concerned about cellulitis and I have discussed with the patient that I do not believe that she has cellulitis as it is typically an indication of infection but out of an abundance of caution we will prescribe her doxycycline .  I have also told her she can take a daily baby aspirin  to try to help with her discomfort as well.  She can utilize ice in the area as well as rest.  The patient has significant anxiety and I believe this is also driving some of her symptoms as well.  Again this is reiterated to the patient that what she is saying is expected and normal findings post endovenous laser ablation.  No need to return or call at this time as I have discussed this directly with the patient.  This note is for documentation purposes only.

## 2024-03-03 NOTE — Telephone Encounter (Signed)
 Patient left voicemail this morning in reference to being seen in the ED yesterday post laser ablation. Patient stated they told her she had thrombophlebitis, patient is concerned about the circle they drew on her leg, the redness is expanding outside of the circle at this time, she states her blood pressure and pulse is extremely high. Patient was advised to follow up in our office this AM. Please advise

## 2024-03-04 ENCOUNTER — Other Ambulatory Visit (INDEPENDENT_AMBULATORY_CARE_PROVIDER_SITE_OTHER): Payer: Self-pay | Admitting: Vascular Surgery

## 2024-03-04 DIAGNOSIS — I8311 Varicose veins of right lower extremity with inflammation: Secondary | ICD-10-CM

## 2024-03-08 ENCOUNTER — Ambulatory Visit (INDEPENDENT_AMBULATORY_CARE_PROVIDER_SITE_OTHER)

## 2024-03-08 DIAGNOSIS — I8311 Varicose veins of right lower extremity with inflammation: Secondary | ICD-10-CM

## 2024-03-08 DIAGNOSIS — I8312 Varicose veins of left lower extremity with inflammation: Secondary | ICD-10-CM

## 2024-03-15 ENCOUNTER — Telehealth (INDEPENDENT_AMBULATORY_CARE_PROVIDER_SITE_OTHER): Payer: Self-pay

## 2024-03-15 NOTE — Telephone Encounter (Signed)
 NP from [facility] left message in reference to telehealth visit with patient today. Patient informed her she has been continuing to have pain in her bilateral calves at this time which has been ongoing for years. NP stated patient is a havy smoker and requested patient to be checked for Peripheral Artery Disease. Spoke with Dr. Dreama at this time and he recommended patient be scheduled for ABI.  Patients appointment for ABI  is 03/22/24 @ 0830.  Returned called to NP and made her aware of the upcoming appointment at this time and front desk staff updated patient of her appointment.

## 2024-03-19 ENCOUNTER — Other Ambulatory Visit (INDEPENDENT_AMBULATORY_CARE_PROVIDER_SITE_OTHER): Payer: Self-pay | Admitting: Nurse Practitioner

## 2024-03-19 DIAGNOSIS — M79669 Pain in unspecified lower leg: Secondary | ICD-10-CM

## 2024-03-21 NOTE — Progress Notes (Signed)
 MRN : [id number]  Andrea Ford is a 73 y.o. (1951-08-11) female who presents with chief complaint of legs hurt and swell.  History of Present Illness:   The patient returns to the office for followup evaluation regarding leg pain and swelling.  She recently had a laser ablation of the left GSV on 03/01/2024.  The swelling has persisted and the pain associated with swelling continues. There have not been any interval development of a ulcerations or wounds.  Since the previous visit the patient has been wearing graduated compression stockings and has noted little if any improvement in the lymphedema. The patient has been using compression routinely morning until night.  The patient also states elevation during the day and exercise is being done too.  No outpatient medications have been marked as taking for the 03/22/24 encounter (Appointment) with Jama, Cordella MATSU, MD.    Past Medical History:  Diagnosis Date   Asthma    COPD (chronic obstructive pulmonary disease) (HCC)    Hypertension    Migraine    Symptomatic varicose veins, bilateral    With multiple intervention is now with if sense of swelling and stasis changes.    Past Surgical History:  Procedure Laterality Date   ANKLE SURGERY Right    CARDIAC CATHETERIZATION Right 07/15/2016   Procedure: Left Heart Cath and Coronary Angiography;  Surgeon: Denyse DELENA Bathe, MD;  Location: [facility] INVASIVE CV LAB;  Service: Cardiovascular;  Laterality: Right;   CHOLECYSTECTOMY     TUBAL LIGATION     Venous Sclerotherapy Bilateral    Lower extremities-[facility].    Social History Social History   Tobacco Use   Smoking status: Every Day   Smokeless tobacco: Never  Vaping Use   Vaping status: Never Used  Substance Use Topics   Alcohol use: No   Drug use: No    Family History Family History  Problem Relation Age of Onset   Hypertension Mother    Migraines Mother    Cancer Mother    Varicose Veins  Mother    Coronary artery disease Father 68       Died prior to CABG today 36   Arthritis Father    Hypertension Father    Heart attack Father 69       Age 88 and 86   Diabetes Mellitus I Brother    Stroke Maternal Grandmother 49   Stroke Maternal Grandfather 32   Stroke Paternal Grandmother 47   Stroke Paternal Grandfather    Diabetes Maternal Uncle    Coronary artery disease Paternal Uncle 65       CABG    Allergies  Allergen Reactions   Prednisone  Rash and Other (See Comments)    hyper    Diphenhydramine  Hcl Other (See Comments)   Metoprolol      Other reaction(s): Hives (752527995) Other reaction(s): Hives ([id number])    Other Other (See Comments)    Pet dander   Oxycodone -Acetaminophen     Codeine Itching and Other (See Comments)   Diphenhydramine  Anxiety   Etodolac Rash, Nausea And Vomiting and Other (See Comments)   Hydrocodone Itching   Meperidine Rash and Other (See Comments)    Pt. Not aware  Of ever having any demerol    Venlafaxine Rash and Other (See Comments)     REVIEW OF SYSTEMS (Negative unless checked)  Constitutional: [] Weight loss  [] Fever  [] Chills Cardiac: [] Chest pain   [] Chest pressure   [] Palpitations   []   Shortness of breath when laying flat   [] Shortness of breath with exertion. Vascular:  [] Pain in legs with walking   [x] Pain in legs at rest  [] History of DVT   [] Phlebitis   [x] Swelling in legs   [] Varicose veins   [] Non-healing ulcers Pulmonary:   [] Uses home oxygen   [] Productive cough   [] Hemoptysis   [] Wheeze  [] COPD   [] Asthma Neurologic:  [] Dizziness   [] Seizures   [] History of stroke   [] History of TIA  [] Aphasia   [] Vissual changes   [] Weakness or numbness in arm   [] Weakness or numbness in leg Musculoskeletal:   [] Joint swelling   [] Joint pain   [] Low back pain Hematologic:  [] Easy bruising  [] Easy bleeding   [] Hypercoagulable state   [] Anemic Gastrointestinal:  [] Diarrhea   [] Vomiting  [] Gastroesophageal reflux/heartburn    [] Difficulty swallowing. Genitourinary:  [] Chronic kidney disease   [] Difficult urination  [] Frequent urination   [] Blood in urine Skin:  [] Rashes   [] Ulcers  Psychological:  [] History of anxiety   []  History of major depression.  Physical Examination  There were no vitals filed for this visit. There is no height or weight on file to calculate BMI. Gen: WD/WN, NAD Head: [place]/AT, No temporalis wasting.  Ear/Nose/Throat: Hearing grossly intact, nares w/o erythema or drainage, pinna without lesions Eyes: PER, EOMI, sclera nonicteric.  Neck: Supple, no gross masses.  No JVD.  Pulmonary:  Good air movement, no audible wheezing, no use of accessory muscles.  Cardiac: RRR, precordium not hyperdynamic. Vascular:  scattered varicosities present bilaterally.  Moderate venous stasis changes to the legs bilaterally.  2+ soft pitting edema. CEAP [id number]   Vessel Right Left  Radial Palpable Palpable  Gastrointestinal: soft, non-distended. No guarding/no peritoneal signs.  Musculoskeletal: M/S 5/5 throughout.  No deformity.  Neurologic: CN 2-12 intact. Pain and light touch intact in extremities.  Symmetrical.  Speech is fluent. Motor exam as listed above. Psychiatric: Judgment intact, Mood & affect appropriate for pt's clinical situation. Dermatologic: Venous rashes no ulcers noted.  No changes consistent with cellulitis. Lymph : No lichenification or skin changes of chronic lymphedema.  CBC Lab Results  Component Value Date   WBC 6.5 03/02/2024   HGB 14.6 03/02/2024   HCT 44.0 03/02/2024   MCV 90.9 03/02/2024   PLT 224 03/02/2024    BMET    Component Value Date/Time   NA 139 03/02/2024 2032   NA 142 07/12/2022 1506   NA 141 11/02/2014 0937   K 3.9 03/02/2024 2032   K 4.0 11/02/2014 0937   CL 105 03/02/2024 2032   CL 107 11/02/2014 0937   CO2 23 03/02/2024 2032   CO2 26 11/02/2014 0937   GLUCOSE 91 03/02/2024 2032   GLUCOSE 124 (H) 11/02/2014 0937   BUN 16 03/02/2024 2032   BUN 15  07/12/2022 1506   BUN 16 11/02/2014 0937   CREATININE 0.73 03/02/2024 2032   CREATININE 0.70 11/02/2014 0937   CALCIUM  8.9 03/02/2024 2032   CALCIUM  9.2 11/02/2014 0937   GFRNONAA >60 03/02/2024 2032   GFRNONAA >60 11/02/2014 0937   GFRAA >60 01/30/2020 1351   GFRAA >60 11/02/2014 0937   CrCl cannot be calculated (Unknown ideal weight.).  COAG Lab Results  Component Value Date   INR 0.91 08/12/2018   INR 0.94 07/13/2016    Radiology VAS US  LOWER EXTREMITY VENOUS POST ABLATION Result Date: 03/08/2024  Lower Venous Reflux Study Patient Name:  AVIV ROTA  Date of Exam:   03/08/2024 Medical  Rec #: [id number]        Accession #:    [id number] Date of Birth: 1950-09-24        Patient Gender: F Patient Age:   87 years Exam Location:  [facility] Procedure:      VAS US  LOWER EXTREMITY VENOUS POST ABLATION Referring Phys: Kalley Nicholl --------------------------------------------------------------------------------  Indications: Varicosities. Other Indications: Left GSV ablation. Performing Technologist: Jerel Croak RVT  Examination Guidelines: A complete evaluation includes B-mode imaging, spectral Doppler, color Doppler, and power Doppler as needed of all accessible portions of each vessel. Bilateral testing is considered an integral part of a complete examination. Limited examinations for reoccurring indications may be performed as noted. The reflux portion of the exam is performed with the patient in reverse Trendelenburg. Significant venous reflux is defined as >500 ms in the superficial venous system, and >1 second in the deep venous system.   Summary: Left: - No evidence of deep vein thrombosis seen in the left lower extremity, from the common femoral through the popliteal veins. - Successful GSV closure s/p ablation  *See table(s) above for measurements and observations. Electronically signed by Cordella Shawl MD on 03/08/2024 at 5:38:59 PM.    Final    US  Venous Img Lower  Unilateral Left Result Date: 03/02/2024 EXAM: ULTRASOUND DUPLEX OF THE LEFT LOWER EXTREMITY VEINS TECHNIQUE: Duplex ultrasound using B-mode/gray scaled imaging and Doppler spectral analysis and color flow was obtained of the deep venous structures of the left lower extremity. COMPARISON: None. CLINICAL HISTORY: L leg pain after venous procedure. FINDINGS: The visualized veins of the lower extremity are patent and free of echogenic thrombus. The veins demonstrate good compressibility with normal color flow study and spectral analysis. IMPRESSION: 1. No evidence of DVT. Electronically signed by: Pinkie Pebbles MD 03/02/2024 11:16 PM EDT RP Workstation: [id number]     Assessment/Plan There are no diagnoses linked to this encounter.   Cordella Shawl, MD  03/21/2024 3:33 PM

## 2024-03-22 ENCOUNTER — Encounter (INDEPENDENT_AMBULATORY_CARE_PROVIDER_SITE_OTHER): Payer: Self-pay | Admitting: Vascular Surgery

## 2024-03-22 ENCOUNTER — Ambulatory Visit (INDEPENDENT_AMBULATORY_CARE_PROVIDER_SITE_OTHER)

## 2024-03-22 ENCOUNTER — Ambulatory Visit (INDEPENDENT_AMBULATORY_CARE_PROVIDER_SITE_OTHER): Admitting: Vascular Surgery

## 2024-03-22 VITALS — BP 181/77 | HR 69 | Resp 16 | Ht 61.0 in | Wt 156.2 lb

## 2024-03-22 DIAGNOSIS — M79604 Pain in right leg: Secondary | ICD-10-CM

## 2024-03-22 DIAGNOSIS — J45909 Unspecified asthma, uncomplicated: Secondary | ICD-10-CM | POA: Diagnosis not present

## 2024-03-22 DIAGNOSIS — I8312 Varicose veins of left lower extremity with inflammation: Secondary | ICD-10-CM

## 2024-03-22 DIAGNOSIS — I8311 Varicose veins of right lower extremity with inflammation: Secondary | ICD-10-CM | POA: Diagnosis not present

## 2024-03-22 DIAGNOSIS — M79669 Pain in unspecified lower leg: Secondary | ICD-10-CM | POA: Diagnosis not present

## 2024-03-22 DIAGNOSIS — I10 Essential (primary) hypertension: Secondary | ICD-10-CM | POA: Diagnosis not present

## 2024-03-22 DIAGNOSIS — M79605 Pain in left leg: Secondary | ICD-10-CM

## 2024-03-22 LAB — VAS US ABI WITH/WO TBI
Left ABI: 1.08
Right ABI: 1.03

## 2024-03-26 ENCOUNTER — Ambulatory Visit (INDEPENDENT_AMBULATORY_CARE_PROVIDER_SITE_OTHER)

## 2024-03-26 DIAGNOSIS — M79605 Pain in left leg: Secondary | ICD-10-CM

## 2024-03-26 DIAGNOSIS — I8312 Varicose veins of left lower extremity with inflammation: Secondary | ICD-10-CM

## 2024-03-26 DIAGNOSIS — M79604 Pain in right leg: Secondary | ICD-10-CM | POA: Diagnosis not present

## 2024-03-26 DIAGNOSIS — I8311 Varicose veins of right lower extremity with inflammation: Secondary | ICD-10-CM

## 2024-03-29 ENCOUNTER — Encounter (INDEPENDENT_AMBULATORY_CARE_PROVIDER_SITE_OTHER): Payer: Self-pay | Admitting: Nurse Practitioner

## 2024-03-29 ENCOUNTER — Ambulatory Visit (INDEPENDENT_AMBULATORY_CARE_PROVIDER_SITE_OTHER): Admitting: Nurse Practitioner

## 2024-03-29 VITALS — BP 137/62 | HR 91 | Resp 18 | Wt 155.0 lb

## 2024-03-29 DIAGNOSIS — I10 Essential (primary) hypertension: Secondary | ICD-10-CM

## 2024-03-29 DIAGNOSIS — I8312 Varicose veins of left lower extremity with inflammation: Secondary | ICD-10-CM

## 2024-03-29 DIAGNOSIS — I8311 Varicose veins of right lower extremity with inflammation: Secondary | ICD-10-CM

## 2024-03-29 DIAGNOSIS — M79669 Pain in unspecified lower leg: Secondary | ICD-10-CM | POA: Diagnosis not present

## 2024-03-29 NOTE — Progress Notes (Signed)
 Subjective:    Patient ID: Andrea Ford, female    DOB: October 23, 1950, 73 y.o.   MRN: [id number] Chief Complaint  Patient presents with   Follow-up    4 week post laser follow up    The patient returns today for follow-up of her left great saphenous vein ablation.  There is no redness or swelling present today.  The patient has significant difficulty postintervention due to redness and swelling and some concern regarding the situation.  She notes that since her intervention the calf pain that she had has improved but it has not completely resolved.  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  She notes that the pain in her calf happens more so when she walks versus at rest.  She had ABIs done recently which were normal.  Her ultrasound for her left lower extremity shows a successful left great saphenous vein ablation with no evidence of DVT.  She is continue to with use of medical grade compression stockings as well as elevation.    Review of Systems  Musculoskeletal:  Positive for arthralgias.  All other systems reviewed and are negative.      Objective:   Physical Exam Vitals reviewed.  HENT:     Head: Normocephalic.  Cardiovascular:     Rate and Rhythm: Normal rate.  Pulmonary:     Effort: Pulmonary effort is normal.  Musculoskeletal:     Right lower leg: No edema.     Left lower leg: No edema.  Skin:    General: Skin is warm and dry.  Neurological:     Mental Status: She is alert and oriented to person, place, and time.  Psychiatric:        Mood and Affect: Mood normal.        Behavior: Behavior normal.        Thought Content: Thought content normal.        Judgment: Judgment normal.     BP 137/62   Pulse 91   Resp 18   Wt 155 lb (70.3 kg)   BMI 29.29 kg/m   Past Medical History:  Diagnosis Date   Asthma    COPD (chronic obstructive pulmonary disease) (HCC)    Hypertension    Migraine    Symptomatic varicose veins,  bilateral    With multiple intervention is now with if sense of swelling and stasis changes.    Social History   Socioeconomic History   Marital status: Divorced    Spouse name: Not on file   Number of children: Not on file   Years of education: Not on file   Highest education level: Not on file  Occupational History   Not on file  Tobacco Use   Smoking status: Every Day   Smokeless tobacco: Never  Vaping Use   Vaping status: Never Used  Substance and Sexual Activity   Alcohol use: No   Drug use: No   Sexual activity: Not on file  Other Topics Concern   Not on file  Social History Narrative   Not on file   Social Drivers of Health   Financial Resource Strain: Medium Risk (04/21/2023)   Received from [facility]   Overall Financial Resource Strain (CARDIA)    Difficulty of Paying Living Expenses: Somewhat hard  Food Insecurity: No Food Insecurity (12/29/2023)   Received from [facility]   Hunger Vital Sign    Within the past 12 months,  you worried that your food would run out before you got the money to buy more.: Never true    Within the past 12 months, the food you bought just didn't last and you didn't have money to get more.: Never true  Transportation Needs: No Transportation Needs (12/29/2023)   Received from [facility] - Transportation    Lack of Transportation (Medical): No    Lack of Transportation (Non-Medical): No  Physical Activity: Inactive (07/14/2018)   Received from [facility]   Exercise Vital Sign    Days of Exercise per Week: 0 days    Minutes of Exercise per Session: 0 min  Stress: Stress Concern Present (07/14/2018)   Received from [facility] - Occupational Stress Questionnaire    Feeling of Stress : Rather much  Social Connections: Unknown (07/14/2018)   Received from [facility]   Social Connection and Isolation Panel    Frequency of Communication with Friends and  Family: More than three times a week    Frequency of Social Gatherings with Friends and Family: More than three times a week    Attends Religious Services: Not on file    Active Member of Clubs or Organizations: Not on file    Attends Banker Meetings: Not on file    Marital Status: Not on file  Intimate Partner Violence: Not At Risk (03/04/2024)   Received from [facility]   Humiliation, Afraid, Rape, and Kick questionnaire    Within the last year, have you been afraid of your partner or ex-partner?: No    Within the last year, have you been humiliated or emotionally abused in other ways by your partner or ex-partner?: No    Within the last year, have you been kicked, hit, slapped, or otherwise physically hurt by your partner or ex-partner?: No    Within the last year, have you been raped or forced to have any kind of sexual activity by your partner or ex-partner?: No    Past Surgical History:  Procedure Laterality Date   ANKLE SURGERY Right    CARDIAC CATHETERIZATION Right 07/15/2016   Procedure: Left Heart Cath and Coronary Angiography;  Surgeon: Denyse DELENA Bathe, MD;  Location: [facility] INVASIVE CV LAB;  Service: Cardiovascular;  Laterality: Right;   CHOLECYSTECTOMY     TUBAL LIGATION     Venous Sclerotherapy Bilateral    Lower extremities-[facility].    Family History  Problem Relation Age of Onset   Hypertension Mother    Migraines Mother    Cancer Mother    Varicose Veins Mother    Coronary artery disease Father 50       Died prior to CABG today 41   Arthritis Father    Hypertension Father    Heart attack Father 37       Age 24 and 24   Diabetes Mellitus I Brother    Stroke Maternal Grandmother 38   Stroke Maternal Grandfather 34   Stroke Paternal Grandmother 40   Stroke Paternal Grandfather    Diabetes Maternal Uncle    Coronary artery disease Paternal Uncle 18       CABG    Allergies  Allergen Reactions   Prednisone  Rash and Other  (See Comments)    hyper    Diphenhydramine  Hcl Other (See Comments)   Metoprolol      Other reaction(s): Hives (752527995) Other reaction(s): Hives ([id number])  Other Other (See Comments)    Pet dander   Oxycodone -Acetaminophen     Codeine Itching and Other (See Comments)   Diphenhydramine  Anxiety   Etodolac Rash, Nausea And Vomiting and Other (See Comments)   Hydrocodone Itching   Meperidine Rash and Other (See Comments)    Pt. Not aware  Of ever having any demerol    Venlafaxine Rash and Other (See Comments)       Latest Ref Rng & Units 03/02/2024    8:32 PM 07/15/2023   12:37 PM 10/01/2022    1:32 PM  CBC  WBC 4.0 - 10.5 K/uL 6.5  5.5  5.5   Hemoglobin 12.0 - 15.0 g/dL 85.3  84.9  85.2   Hematocrit 36.0 - 46.0 % 44.0  43.9  43.8   Platelets 150 - 400 K/uL 224  220  235       CMP     Component Value Date/Time   NA 139 03/02/2024 2032   NA 142 07/12/2022 1506   NA 141 11/02/2014 0937   K 3.9 03/02/2024 2032   K 4.0 11/02/2014 0937   CL 105 03/02/2024 2032   CL 107 11/02/2014 0937   CO2 23 03/02/2024 2032   CO2 26 11/02/2014 0937   GLUCOSE 91 03/02/2024 2032   GLUCOSE 124 (H) 11/02/2014 0937   BUN 16 03/02/2024 2032   BUN 15 07/12/2022 1506   BUN 16 11/02/2014 0937   CREATININE 0.73 03/02/2024 2032   CREATININE 0.70 11/02/2014 0937   CALCIUM  8.9 03/02/2024 2032   CALCIUM  9.2 11/02/2014 0937   PROT 7.7 07/15/2023 1237   PROT 7.7 09/14/2014 1104   ALBUMIN 4.4 07/15/2023 1237   ALBUMIN 3.8 09/14/2014 1104   AST 21 07/15/2023 1237   AST 28 09/14/2014 1104   ALT 16 07/15/2023 1237   ALT 32 09/14/2014 1104   ALKPHOS 62 07/15/2023 1237   ALKPHOS 72 09/14/2014 1104   BILITOT 1.0 07/15/2023 1237   BILITOT 0.5 09/14/2014 1104   EGFR 79 07/12/2022 1506   GFRNONAA >60 03/02/2024 2032   GFRNONAA >60 11/02/2014 0937     VAS US  ABI WITH/WO TBI Result Date: 03/22/2024  LOWER EXTREMITY DOPPLER STUDY Patient Name:  MABLE DARA  Date of Exam:   03/22/2024  Medical Rec #: [id number]        Accession #:    [id number] Date of Birth: [birth year]        Patient Gender: F Patient Age:   85 years Exam Location:  [facility] Procedure:      VAS US  ABI WITH/WO TBI Referring Phys: --------------------------------------------------------------------------------  Indications: Rest pain.  Comparison Study: 2020 Performing Technologist: Jerel Croak RVT  Examination Guidelines: A complete evaluation includes at minimum, Doppler waveform signals and systolic blood pressure reading at the level of bilateral brachial, anterior tibial, and posterior tibial arteries, when vessel segments are accessible. Bilateral testing is considered an integral part of a complete examination. Photoelectric Plethysmograph (PPG) waveforms and toe systolic pressure readings are included as required and additional duplex testing as needed. Limited examinations for reoccurring indications may be performed as noted.  ABI Findings: +---------+------------------+-----+---------+--------+ Right    Rt Pressure (mmHg)IndexWaveform Comment  +---------+------------------+-----+---------+--------+ Brachial 164                                      +---------+------------------+-----+---------+--------+ PTA      170  1.03 triphasic         +---------+------------------+-----+---------+--------+ DP       166               1.01 triphasic         +---------+------------------+-----+---------+--------+ Great Toe163               0.99 Normal            +---------+------------------+-----+---------+--------+ +---------+------------------+-----+---------+-------+ Left     Lt Pressure (mmHg)IndexWaveform Comment +---------+------------------+-----+---------+-------+ Brachial 165                                     +---------+------------------+-----+---------+-------+ PTA      170               1.03 triphasic         +---------+------------------+-----+---------+-------+ DP       178               1.08 triphasic        +---------+------------------+-----+---------+-------+ Great Toe151               0.92 Normal           +---------+------------------+-----+---------+-------+ +-------+-----------+-----------+------------+------------+ ABI/TBIToday's ABIToday's TBIPrevious ABIPrevious TBI +-------+-----------+-----------+------------+------------+ Right  1.03       .99        1.08                     +-------+-----------+-----------+------------+------------+ Left   1.08       .92        1.08                     +-------+-----------+-----------+------------+------------+ Bilateral ABIs and TBIs appear essentially unchanged compared to prior study on 2020.  Summary: Right: Resting right ankle-brachial index is within normal range. The right toe-brachial index is normal. Left: Resting left ankle-brachial index is within normal range. The left toe-brachial index is normal. *See table(s) above for measurements and observations.  Electronically signed by Cordella Shawl MD on 03/22/2024 at 4:42:18 PM.    Final        Assessment & Plan:   1. Varicose veins of both lower extremities with inflammation (Primary) The patient notes some improvement in the pain in her calf post ablation but it is not completely resolved.  She also notes pain in her right lower extremity as well.  Given the pain in her right lower extremity she is considering a possible endovenous ablation of the leg as well.  She has not had studies done of this lower extremity since 2022.  Will have her return at her convenience for a right lower extremity venous reflux study.  Pending the results of that study we may plan possible endovenous ablation.  If there is no evidence of reflux we will discuss sclerotherapy.  2. Calf pain, unspecified laterality Despite treatment with endovenous ablation she continues to have some calf pain  with activity.  She has had ABIs which were normal and consistent with studies she had done in 2020.  As noted above we have discussed sclerotherapy with the patient as a means to treat her continued calf pain.  I have discussed with the patient that with sclerotherapy, it is relatively safe and minimally invasive with little risk.  If we were to move forward with sclerotherapy if after completing treatment she still continue to have pain in the calf, workup  for nonvascular source would be the most prudent course of action.  3. Essential hypertension Continue antihypertensive medications as already ordered, these medications have been reviewed and there are no changes at this time.   Current Outpatient Medications on File Prior to Visit  Medication Sig Dispense Refill   acetaminophen  (TYLENOL ) 650 MG CR tablet Take by mouth.     albuterol  (PROVENTIL  HFA;VENTOLIN  HFA) 108 (90 BASE) MCG/ACT inhaler Inhale into the lungs every 6 (six) hours as needed for wheezing or shortness of breath.     albuterol  (PROVENTIL ) (2.5 MG/3ML) 0.083% nebulizer solution Take 3 mLs (2.5 mg total) by nebulization every 6 (six) hours as needed for wheezing or shortness of breath. 75 mL 0   ALPRAZolam  (XANAX ) 0.5 MG tablet Take 1st tablet 1 hour before procedure and take 2nd tablet once arrived in the office 2 tablet 0   atenolol (TENORMIN) 25 MG tablet Take 25 mg by mouth daily.     atorvastatin  (LIPITOR) 40 MG tablet Take 1 tablet (40 mg total) by mouth daily at 6 PM. (Patient taking differently: Take 20 mg by mouth daily.) 30 tablet 2   busPIRone  (BUSPAR ) 10 MG tablet      Cholecalciferol (VITAMIN D-3 PO) Take 1 tablet by mouth daily.     citalopram  (CELEXA ) 20 MG tablet Take 20 mg by mouth daily.      clonazePAM  (KLONOPIN ) 0.5 MG tablet Take 0.25 mg by mouth 2 (two) times daily as needed for anxiety.     dicyclomine  (BENTYL ) 20 MG tablet Take 20 mg by mouth daily as needed for spasms.     diltiazem  (DILACOR XR ) 180 MG  24 hr capsule Take 1 capsule (180 mg total) by mouth daily. Take 2 hour prior to Coronary CTA. One time only 1 capsule 0   doxycycline  (VIBRAMYCIN ) 100 MG capsule Take 1 capsule (100 mg total) by mouth 2 (two) times daily. 20 capsule 0   famotidine  (PEPCID ) 20 MG tablet Take by mouth.     famotidine  (PEPCID ) 40 MG tablet Take 1 tablet (40 mg total) by mouth at bedtime. 30 tablet 1   Fluocinolone Acetonide Scalp 0.01 % OIL Apply topically.     fluticasone (FLONASE) 50 MCG/ACT nasal spray 1 spray into each nostril daily as needed for allergies.     losartan  (COZAAR ) 100 MG tablet Take 1 tablet by mouth daily.     meloxicam (MOBIC) 15 MG tablet Take 15 mg by mouth daily.     Multiple Vitamin (MULTIVITAMIN) capsule Take by mouth.     nystatin  ointment (MYCOSTATIN ) Apply 1 Application topically 2 (two) times daily. 30 g 2   rizatriptan (MAXALT-MLT) 10 MG disintegrating tablet Take 10 mg by mouth daily as needed.     sodium chloride  0.9 % nebulizer solution      traZODone (DESYREL) 50 MG tablet Take 50 mg by mouth at bedtime as needed for sleep.     triamcinolone  ointment (KENALOG ) 0.1 % APPLY  OINTMENT TOPICALLY TO AFFECTED AREA TWICE DAILY     No current facility-administered medications on file prior to visit.    There are no Patient Instructions on file for this visit. No follow-ups on file.   Yoshino Broccoli E Elroy Schembri, NP

## 2024-04-11 ENCOUNTER — Emergency Department

## 2024-04-11 ENCOUNTER — Other Ambulatory Visit: Payer: Self-pay

## 2024-04-11 ENCOUNTER — Emergency Department
Admission: EM | Admit: 2024-04-11 | Discharge: 2024-04-11 | Disposition: A | Discharge status: Home / Self Care | Attending: Emergency Medicine | Admitting: Emergency Medicine

## 2024-04-11 DIAGNOSIS — I10 Essential (primary) hypertension: Secondary | ICD-10-CM | POA: Insufficient documentation

## 2024-04-11 DIAGNOSIS — R519 Headache, unspecified: Secondary | ICD-10-CM | POA: Diagnosis not present

## 2024-04-11 DIAGNOSIS — R002 Palpitations: Secondary | ICD-10-CM | POA: Insufficient documentation

## 2024-04-11 DIAGNOSIS — J4489 Other specified chronic obstructive pulmonary disease: Secondary | ICD-10-CM | POA: Insufficient documentation

## 2024-04-11 LAB — BASIC METABOLIC PANEL WITH GFR
Anion gap: 11 (ref 5–15)
BUN: 13 mg/dL (ref 8–23)
CO2: 23 mmol/L (ref 22–32)
Calcium: 8.9 mg/dL (ref 8.9–10.3)
Chloride: 104 mmol/L (ref 98–111)
Creatinine, Ser: 1 mg/dL (ref 0.44–1.00)
GFR, Estimated: >60 mL/min (ref >60)
Glucose, Bld: 140 mg/dL — ABNORMAL HIGH (ref 70–99)
Potassium: 4.2 mmol/L (ref 3.5–5.1)
Sodium: 138 mmol/L (ref 135–145)

## 2024-04-11 LAB — CBC
HCT: 44.5 % (ref 36.0–46.0)
Hemoglobin: 14.8 g/dL (ref 12.0–15.0)
MCH: 30 pg (ref 26.0–34.0)
MCHC: 33.3 g/dL (ref 30.0–36.0)
MCV: 90.3 fL (ref 80.0–100.0)
Platelets: 147 K/uL — ABNORMAL LOW (ref 150–400)
RBC: 4.93 MIL/uL (ref 3.87–5.11)
RDW: 11.8 % (ref 11.5–15.5)
WBC: 6.7 K/uL (ref 4.0–10.5)
nRBC: 0 % (ref 0.0–0.2)

## 2024-04-11 LAB — TROPONIN I (HIGH SENSITIVITY): Troponin I (High Sensitivity): 3 ng/L (ref <18)

## 2024-04-11 MED ORDER — PROCHLORPERAZINE EDISYLATE 10 MG/2ML IJ SOLN
5.0000 mg | Freq: Once | INTRAMUSCULAR | Status: AC
  Administered 2024-04-11: 5 mg via INTRAVENOUS
  Filled 2024-04-11: qty 2

## 2024-04-11 NOTE — ED Triage Notes (Signed)
 Patient ambulatory to triage with complaints of palpitations, head pressure, and high blood pressure. States she has not felt right since being discharged from [facility] 8/25. States she was worked up for stroke due to word finding on that visit.

## 2024-04-11 NOTE — ED Provider Notes (Signed)
 [facility] Provider Note    Event Date/Time   First MD Initiated Contact with Patient 04/11/24 2028     (approximate)   History   Chief Complaint Palpitations and Hypertension   HPI  Andrea Ford is a 73 y.o. female with past medical history of hypertension, hyperlipidemia, COPD, asthma, migraines, and anxiety who presents to the ED complaining of palpitations and headache.  Patient reports that she was initially seen in the [facility] ED 6 days ago following an episode where she had difficulty speaking and was assessed for possible stroke.  CT head was negative at that time and patient was evaluated by neurology.  Due to low suspicion for stroke, she was discharged home with plan for CTA of her head and neck later this week.  Patient reports that since then, she has been having a gradually worsening diffuse headache along with intermittent sensation of her heart racing.  She denies any pain in her chest or difficulty breathing.  She has had some intermittent speech difficulty since then, but denies any currently.  She has not had any vision changes, numbness, or weakness.     Physical Exam   Triage Vital Signs: ED Triage Vitals [04/11/24 2023]  Encounter Vitals Group     BP (!) 189/104     Girls Systolic BP Percentile      Girls Diastolic BP Percentile      Boys Systolic BP Percentile      Boys Diastolic BP Percentile      Pulse Rate (!) 119     Resp 20     Temp 98.2 F (36.8 C)     Temp Source Oral     SpO2 96 %     Weight 154 lb 15.7 oz (70.3 kg)     Height 5' 1 (1.549 m)     Head Circumference      Peak Flow      Pain Score 9     Pain Loc      Pain Education      Exclude from Growth Chart     Most recent vital signs: Vitals:   04/11/24 2225 04/11/24 2245  BP: (!) 157/82 (!) 153/78  Pulse: 74 85  Resp: 17 17  Temp: 97.9 F (36.6 C) 98 F (36.7 C)  SpO2: 97% 100%    Constitutional: Alert and oriented. Eyes: Conjunctivae are  normal. Head: Atraumatic. Nose: No congestion/rhinnorhea. Mouth/Throat: Mucous membranes are moist.  Neck: Supple with no meningismus. Cardiovascular: Normal rate, regular rhythm. Grossly normal heart sounds.  2+ radial pulses bilaterally. Respiratory: Normal respiratory effort.  No retractions. Lungs CTAB. Gastrointestinal: Soft and nontender. No distention. Musculoskeletal: No lower extremity tenderness nor edema.  Neurologic:  Normal speech and language. No gross focal neurologic deficits are appreciated.    ED Results / Procedures / Treatments   Labs (all labs ordered are listed, but only abnormal results are displayed) Labs Reviewed  BASIC METABOLIC PANEL WITH GFR - Abnormal; Notable for the following components:      Result Value   Glucose, Bld 140 (*)    All other components within normal limits  CBC - Abnormal; Notable for the following components:   Platelets 147 (*)    All other components within normal limits  TROPONIN I (HIGH SENSITIVITY)     EKG  ED ECG REPORT I, [name], the attending physician, personally viewed and interpreted this ECG.   Date: 04/11/2024  EKG Time: 20:23  Rate: 113  Rhythm: sinus tachycardia  Axis: Normal  Intervals:none  ST&T Change: Nonspecific ST abnormality  RADIOLOGY CT head reviewed and interpreted by me with no hemorrhage or midline shift.  PROCEDURES:  Critical Care performed: No  Procedures   MEDICATIONS ORDERED IN ED: Medications  prochlorperazine  (COMPAZINE ) injection 5 mg (5 mg Intravenous Given 04/11/24 2138)     IMPRESSION / MDM / ASSESSMENT AND PLAN / ED COURSE  I reviewed the triage vital signs and the nursing notes.                              73 y.o. female with past medical history of hypertension, hyperlipidemia, COPD, asthma, migraines, and anxiety who presents to the ED complaining of gradually worsening headache over the past 5 to 6 days with palpitations.  Patient's presentation is most  consistent with acute presentation with potential threat to life or bodily function.  Differential diagnosis includes, but is not limited to, stroke, TIA, SAH, meningitis, anxiety, anemia, electrolyte abnormality, AKI, arrhythmia, ACS.  Patient nontoxic-appearing and in no acute distress, vital signs remarkable for tachycardia but otherwise reassuring.  EKG shows sinus tachycardia with no ischemic changes, troponin within normal limits and I doubt ACS or PE.  Additional labs are reassuring with no significant anemia, leukocytosis, electrolyte abnormality, or AKI.  Heart rate has improved as patient has been more time in the ED.  She was offered MRI to exclude acute stroke, but states that she has severe claustrophobia and cannot tolerate MRI even with medication for anxiety.  Repeat CT was performed and negative for acute process, low suspicion for acute stroke given 6 days of symptoms now with negative CT.  She does have follow-up with neurology scheduled for later this week, no findings concerning for LVO that would necessitate CTA at this time.  She was counseled to return to the ED for new or worsening symptoms, patient agrees with plan.      FINAL CLINICAL IMPRESSION(S) / ED DIAGNOSES   Final diagnoses:  Palpitations  Acute nonintractable headache, unspecified headache type     Rx / DC Orders   ED Discharge Orders     None        Note:  This document was prepared using Dragon voice recognition software and may include unintentional dictation errors.   Willo Dunnings, MD 04/11/24 [phone]

## 2024-04-12 DIAGNOSIS — I639 Cerebral infarction, unspecified: Secondary | ICD-10-CM

## 2024-04-12 HISTORY — DX: Cerebral infarction, unspecified: I63.9

## 2024-04-27 ENCOUNTER — Other Ambulatory Visit (INDEPENDENT_AMBULATORY_CARE_PROVIDER_SITE_OTHER): Payer: Self-pay | Admitting: Nurse Practitioner

## 2024-04-27 DIAGNOSIS — I8312 Varicose veins of left lower extremity with inflammation: Secondary | ICD-10-CM

## 2024-04-29 ENCOUNTER — Encounter (INDEPENDENT_AMBULATORY_CARE_PROVIDER_SITE_OTHER)

## 2024-04-29 ENCOUNTER — Ambulatory Visit (INDEPENDENT_AMBULATORY_CARE_PROVIDER_SITE_OTHER): Admitting: Nurse Practitioner

## 2024-05-21 ENCOUNTER — Encounter: Payer: Self-pay | Admitting: Student

## 2024-05-24 ENCOUNTER — Other Ambulatory Visit: Payer: Self-pay | Admitting: Student

## 2024-05-24 ENCOUNTER — Encounter (INDEPENDENT_AMBULATORY_CARE_PROVIDER_SITE_OTHER)

## 2024-05-24 ENCOUNTER — Ambulatory Visit (INDEPENDENT_AMBULATORY_CARE_PROVIDER_SITE_OTHER): Admitting: Vascular Surgery

## 2024-05-24 DIAGNOSIS — H93A9 Pulsatile tinnitus, unspecified ear: Secondary | ICD-10-CM

## 2024-05-27 ENCOUNTER — Ambulatory Visit
Admission: RE | Admit: 2024-05-27 | Discharge: 2024-05-27 | Disposition: A | Discharge status: Home / Self Care | Source: Ambulatory Visit | Attending: Student | Admitting: Student

## 2024-05-27 DIAGNOSIS — H93A9 Pulsatile tinnitus, unspecified ear: Secondary | ICD-10-CM | POA: Insufficient documentation

## 2024-06-15 ENCOUNTER — Encounter (INDEPENDENT_AMBULATORY_CARE_PROVIDER_SITE_OTHER): Payer: Self-pay | Admitting: Nurse Practitioner

## 2024-06-15 ENCOUNTER — Ambulatory Visit (INDEPENDENT_AMBULATORY_CARE_PROVIDER_SITE_OTHER): Admitting: Nurse Practitioner

## 2024-06-15 VITALS — BP 150/80 | HR 93 | Resp 18 | Ht 61.0 in | Wt 155.8 lb

## 2024-06-15 DIAGNOSIS — F17210 Nicotine dependence, cigarettes, uncomplicated: Secondary | ICD-10-CM

## 2024-06-15 DIAGNOSIS — I10 Essential (primary) hypertension: Secondary | ICD-10-CM

## 2024-06-15 DIAGNOSIS — I6523 Occlusion and stenosis of bilateral carotid arteries: Secondary | ICD-10-CM | POA: Diagnosis not present

## 2024-06-27 ENCOUNTER — Encounter (INDEPENDENT_AMBULATORY_CARE_PROVIDER_SITE_OTHER): Payer: Self-pay | Admitting: Nurse Practitioner

## 2024-06-27 NOTE — Progress Notes (Signed)
 Subjective:    Patient ID: Andrea Ford, female    DOB: Apr 12, 1951, 73 y.o.   MRN: [id number] Chief Complaint  Patient presents with   New Patient (Initial Visit)    . consult see GS/FB. Carotid done 05/27/24. Bilat Carotid stenosis  ref: [place],Elizabeth  Pt reports recent stroke can hear heart pulsing in ears Gets winded with walking    The patient presents today as a referral due to concern for carotid artery stenosis.  The patient had an episode which was felt to be a possible TIA and/or stroke.  She notes that she had an incident where she was unable to speak for short time.  She was taken to [facility] and underwent a CTA neck which showed no significant arterial stenosis.  In addition she had a carotid artery duplex which shows also less than 50% stenosis in the bilateral internal carotid arteries.  Since that time she has not had any further recurrence of these episodes.  She does note that she has had a history of migraines.  She denies amaurosis fugax.    Review of Systems  All other systems reviewed and are negative.      Objective:   Physical Exam Vitals reviewed.  HENT:     Head: Normocephalic.  Neck:     Vascular: No carotid bruit.  Cardiovascular:     Rate and Rhythm: Normal rate.     Pulses: Normal pulses.  Pulmonary:     Effort: Pulmonary effort is normal.  Skin:    General: Skin is warm and dry.  Neurological:     Mental Status: She is alert and oriented to person, place, and time.  Psychiatric:        Mood and Affect: Mood normal.        Behavior: Behavior normal.        Thought Content: Thought content normal.        Judgment: Judgment normal.     BP (!) 150/80   Pulse 93   Resp 18   Ht 5' 1 (1.549 m)   Wt 155 lb 12.8 oz (70.7 kg)   BMI 29.44 kg/m   Past Medical History:  Diagnosis Date   Asthma    COPD (chronic obstructive pulmonary disease) (HCC)    Hypertension    Migraine    Stroke (HCC) 04/2024   Symptomatic varicose veins,  bilateral    With multiple intervention is now with if sense of swelling and stasis changes.    Social History   Socioeconomic History   Marital status: Divorced    Spouse name: Not on file   Number of children: Not on file   Years of education: Not on file   Highest education level: Not on file  Occupational History   Not on file  Tobacco Use   Smoking status: Every Day   Smokeless tobacco: Never  Vaping Use   Vaping status: Never Used  Substance and Sexual Activity   Alcohol use: No   Drug use: No   Sexual activity: Not on file  Other Topics Concern   Not on file  Social History Narrative   Not on file   Social Drivers of Health   Financial Resource Strain: Medium Risk (04/21/2023)   Received from [facility]   Overall Financial Resource Strain (CARDIA)    Difficulty of Paying Living Expenses: Somewhat hard  Food Insecurity: No Food Insecurity (12/29/2023)   Received from [facility]   Hunger Vital Sign  Within the past 12 months, you worried that your food would run out before you got the money to buy more.: Never true    Within the past 12 months, the food you bought just didn't last and you didn't have money to get more.: Never true  Transportation Needs: No Transportation Needs (12/29/2023)   Received from [facility] - Transportation    Lack of Transportation (Medical): No    Lack of Transportation (Non-Medical): No  Physical Activity: Inactive (07/14/2018)   Received from [facility]   Exercise Vital Sign    Days of Exercise per Week: 0 days    Minutes of Exercise per Session: 0 min  Stress: Stress Concern Present (07/14/2018)   Received from [facility] - Occupational Stress Questionnaire    Feeling of Stress : Rather much  Social Connections: Unknown (07/14/2018)   Received from [facility]   Social Connection and Isolation Panel    Frequency of Communication with Friends and  Family: More than three times a week    Frequency of Social Gatherings with Friends and Family: More than three times a week    Attends Religious Services: Not on file    Active Member of Clubs or Organizations: Not on file    Attends Banker Meetings: Not on file    Marital Status: Not on file  Intimate Partner Violence: Not At Risk (03/04/2024)   Received from [facility]   Humiliation, Afraid, Rape, and Kick questionnaire    Within the last year, have you been afraid of your partner or ex-partner?: No    Within the last year, have you been humiliated or emotionally abused in other ways by your partner or ex-partner?: No    Within the last year, have you been kicked, hit, slapped, or otherwise physically hurt by your partner or ex-partner?: No    Within the last year, have you been raped or forced to have any kind of sexual activity by your partner or ex-partner?: No    Past Surgical History:  Procedure Laterality Date   ANKLE SURGERY Right    CARDIAC CATHETERIZATION Right 07/15/2016   Procedure: Left Heart Cath and Coronary Angiography;  Surgeon: Denyse DELENA Bathe, MD;  Location: [facility] INVASIVE CV LAB;  Service: Cardiovascular;  Laterality: Right;   CHOLECYSTECTOMY     TUBAL LIGATION     Venous Sclerotherapy Bilateral    Lower extremities-[facility].    Family History  Problem Relation Age of Onset   Hypertension Mother    Migraines Mother    Cancer Mother    Varicose Veins Mother    Coronary artery disease Father 51       Died prior to CABG today 61   Arthritis Father    Hypertension Father    Heart attack Father 35       Age 23 and 18   Diabetes Mellitus I Brother    Stroke Maternal Grandmother 75   Stroke Maternal Grandfather 75   Stroke Paternal Grandmother 14   Stroke Paternal Grandfather    Diabetes Maternal Uncle    Coronary artery disease Paternal Uncle 35       CABG    Allergies  Allergen Reactions   Prednisone  Rash and Other  (See Comments)    hyper    Diphenhydramine  Hcl Other (See Comments)   Metoprolol      Other reaction(s): Hives ([id number]) Other  reaction(s): Hives ([id number])    Other Other (See Comments)    Pet dander   Oxycodone -Acetaminophen     Codeine Itching and Other (See Comments)   Diphenhydramine  Anxiety   Etodolac Rash, Nausea And Vomiting and Other (See Comments)   Hydrocodone Itching   Meperidine Rash and Other (See Comments)    Pt. Not aware  Of ever having any demerol    Venlafaxine Rash and Other (See Comments)       Latest Ref Rng & Units 04/11/2024    8:25 PM 03/02/2024    8:32 PM 07/15/2023   12:37 PM  CBC  WBC 4.0 - 10.5 K/uL 6.7  6.5  5.5   Hemoglobin 12.0 - 15.0 g/dL 85.1  85.3  84.9   Hematocrit 36.0 - 46.0 % 44.5  44.0  43.9   Platelets 150 - 400 K/uL 147  224  220       CMP     Component Value Date/Time   NA 138 04/11/2024 2025   NA 142 07/12/2022 1506   NA 141 11/02/2014 0937   K 4.2 04/11/2024 2025   K 4.0 11/02/2014 0937   CL 104 04/11/2024 2025   CL 107 11/02/2014 0937   CO2 23 04/11/2024 2025   CO2 26 11/02/2014 0937   GLUCOSE 140 (H) 04/11/2024 2025   GLUCOSE 124 (H) 11/02/2014 0937   BUN 13 04/11/2024 2025   BUN 15 07/12/2022 1506   BUN 16 11/02/2014 0937   CREATININE 1.00 04/11/2024 2025   CREATININE 0.70 11/02/2014 0937   CALCIUM  8.9 04/11/2024 2025   CALCIUM  9.2 11/02/2014 0937   PROT 7.7 07/15/2023 1237   PROT 7.7 09/14/2014 1104   ALBUMIN 4.4 07/15/2023 1237   ALBUMIN 3.8 09/14/2014 1104   AST 21 07/15/2023 1237   AST 28 09/14/2014 1104   ALT 16 07/15/2023 1237   ALT 32 09/14/2014 1104   ALKPHOS 62 07/15/2023 1237   ALKPHOS 72 09/14/2014 1104   BILITOT 1.0 07/15/2023 1237   BILITOT 0.5 09/14/2014 1104   EGFR 79 07/12/2022 1506   GFRNONAA >60 04/11/2024 2025   GFRNONAA >60 11/02/2014 0937     No results found.     Assessment & Plan:   1. Bilateral carotid artery stenosis (Primary) The patient's carotid duplex correlates  with her CTA which shows that the patient does not have significant carotid stenosis.  We discussed that in a patient that is asymptomatic typically the threshold for repair is 75%.  However patient with questionable symptoms threshold for repair is typically about 60% or greater.  Multiple studies have shown that with a stenosis of less than 50% there typically is no appreciable benefit to the patient and there is likely the possibility of greater harm, and so no intervention is recommended even with strokelike symptoms in a patient with stenosis less than 60%.  Will have the patient return in 3 months to repeat studies.  If she indeed continues to have a less than 60% stenosis we will likely move to annual follow-ups. - VAS US  CAROTID; Future  2. Essential hypertension Continue antihypertensive medications as already ordered, these medications have been reviewed and there are no changes at this time.  3. Heavy smoker (more than 20 cigarettes per day) Smoking cessation was discussed, 3-10 minutes spent on this topic specifically   Current Outpatient Medications on File Prior to Visit  Medication Sig Dispense Refill   acetaminophen  (TYLENOL ) 650 MG CR tablet Take by mouth.     albuterol  (  PROVENTIL  HFA;VENTOLIN  HFA) 108 (90 BASE) MCG/ACT inhaler Inhale into the lungs every 6 (six) hours as needed for wheezing or shortness of breath.     albuterol  (PROVENTIL ) (2.5 MG/3ML) 0.083% nebulizer solution Take 3 mLs (2.5 mg total) by nebulization every 6 (six) hours as needed for wheezing or shortness of breath. 75 mL 0   atenolol (TENORMIN) 25 MG tablet Take 25 mg by mouth daily.     azithromycin  (ZITHROMAX ) 250 MG tablet Take 2 tablets PO on Day 1 and then 1 tablet PO on Day 2-5.     busPIRone  (BUSPAR ) 10 MG tablet      Cholecalciferol (VITAMIN D-3 PO) Take 1 tablet by mouth daily.     citalopram  (CELEXA ) 20 MG tablet Take 20 mg by mouth daily.      clonazePAM  (KLONOPIN ) 0.5 MG tablet Take 0.25 mg by  mouth 2 (two) times daily as needed for anxiety.     dicyclomine  (BENTYL ) 20 MG tablet Take 20 mg by mouth daily as needed for spasms.     famotidine  (PEPCID ) 20 MG tablet Take by mouth.     famotidine  (PEPCID ) 40 MG tablet Take 1 tablet (40 mg total) by mouth at bedtime. 30 tablet 1   fluticasone (FLONASE) 50 MCG/ACT nasal spray 1 spray into each nostril daily as needed for allergies.     losartan  (COZAAR ) 100 MG tablet Take 1 tablet by mouth daily.     meloxicam (MOBIC) 15 MG tablet Take 15 mg by mouth daily.     Multiple Vitamin (MULTIVITAMIN) capsule Take by mouth.     rizatriptan (MAXALT-MLT) 10 MG disintegrating tablet Take 10 mg by mouth daily as needed.     sodium chloride  0.9 % nebulizer solution      traZODone (DESYREL) 50 MG tablet Take 50 mg by mouth at bedtime as needed for sleep.     triamcinolone  ointment (KENALOG ) 0.1 % APPLY  OINTMENT TOPICALLY TO AFFECTED AREA TWICE DAILY     ALPRAZolam  (XANAX ) 0.5 MG tablet Take 1st tablet 1 hour before procedure and take 2nd tablet once arrived in the office (Patient not taking: Reported on 06/15/2024) 2 tablet 0   atorvastatin  (LIPITOR) 40 MG tablet Take 1 tablet (40 mg total) by mouth daily at 6 PM. (Patient taking differently: Take 20 mg by mouth daily.) 30 tablet 2   diltiazem  (DILACOR XR ) 180 MG 24 hr capsule Take 1 capsule (180 mg total) by mouth daily. Take 2 hour prior to Coronary CTA. One time only (Patient not taking: Reported on 06/15/2024) 1 capsule 0   doxycycline  (VIBRAMYCIN ) 100 MG capsule Take 1 capsule (100 mg total) by mouth 2 (two) times daily. (Patient not taking: Reported on 06/15/2024) 20 capsule 0   nystatin  ointment (MYCOSTATIN ) Apply 1 Application topically 2 (two) times daily. (Patient not taking: Reported on 06/15/2024) 30 g 2   No current facility-administered medications on file prior to visit.    There are no Patient Instructions on file for this visit. Return in about 3 months (around 09/15/2024) for Carotid  stenosis.   Jacquelyn Antony E Abed Schar, NP

## 2024-09-13 ENCOUNTER — Encounter (INDEPENDENT_AMBULATORY_CARE_PROVIDER_SITE_OTHER)

## 2024-09-13 ENCOUNTER — Ambulatory Visit (INDEPENDENT_AMBULATORY_CARE_PROVIDER_SITE_OTHER): Admitting: Vascular Surgery

## 2024-09-15 ENCOUNTER — Encounter (INDEPENDENT_AMBULATORY_CARE_PROVIDER_SITE_OTHER)

## 2024-09-15 ENCOUNTER — Ambulatory Visit (INDEPENDENT_AMBULATORY_CARE_PROVIDER_SITE_OTHER): Admitting: Nurse Practitioner

## 2024-09-27 ENCOUNTER — Encounter (INDEPENDENT_AMBULATORY_CARE_PROVIDER_SITE_OTHER)

## 2024-09-27 ENCOUNTER — Ambulatory Visit (INDEPENDENT_AMBULATORY_CARE_PROVIDER_SITE_OTHER): Admitting: Vascular Surgery
